# Patient Record
Sex: Female | Born: 1945
Health system: Southern US, Community
[De-identification: ages and names within clinical notes are randomized; demographics above are authoritative.]

## PROBLEM LIST (undated history)

## (undated) DIAGNOSIS — F32A Depression, unspecified: Secondary | ICD-10-CM

## (undated) DIAGNOSIS — Z952 Presence of prosthetic heart valve: Secondary | ICD-10-CM

## (undated) DIAGNOSIS — C801 Malignant (primary) neoplasm, unspecified: Secondary | ICD-10-CM

## (undated) DIAGNOSIS — F329 Major depressive disorder, single episode, unspecified: Secondary | ICD-10-CM

## (undated) DIAGNOSIS — J90 Pleural effusion, not elsewhere classified: Secondary | ICD-10-CM

## (undated) DIAGNOSIS — I10 Essential (primary) hypertension: Secondary | ICD-10-CM

## (undated) DIAGNOSIS — I4891 Unspecified atrial fibrillation: Secondary | ICD-10-CM

## (undated) DIAGNOSIS — B029 Zoster without complications: Secondary | ICD-10-CM

## (undated) DIAGNOSIS — E7800 Pure hypercholesterolemia, unspecified: Secondary | ICD-10-CM

## (undated) DIAGNOSIS — F0390 Unspecified dementia without behavioral disturbance: Secondary | ICD-10-CM

## (undated) DIAGNOSIS — G5600 Carpal tunnel syndrome, unspecified upper limb: Secondary | ICD-10-CM

## (undated) HISTORY — PX: CARDIAC VALVE REPLACEMENT: SHX585

## (undated) HISTORY — DX: Zoster without complications: B02.9

## (undated) HISTORY — DX: Presence of prosthetic heart valve: Z95.2

## (undated) HISTORY — PX: DILATION AND CURETTAGE OF UTERUS: SHX78

## (undated) HISTORY — PX: TUBAL LIGATION: SHX77

## (undated) HISTORY — DX: Essential (primary) hypertension: I10

## (undated) HISTORY — DX: Depression, unspecified: F32.A

## (undated) HISTORY — DX: Pleural effusion, not elsewhere classified: J90

## (undated) HISTORY — PX: OTHER SURGICAL HISTORY: SHX169

## (undated) HISTORY — DX: Major depressive disorder, single episode, unspecified: F32.9

## (undated) HISTORY — DX: Carpal tunnel syndrome, unspecified upper limb: G56.00

## (undated) HISTORY — DX: Pure hypercholesterolemia, unspecified: E78.00

---

## 2001-11-05 ENCOUNTER — Ambulatory Visit (HOSPITAL_COMMUNITY): Admission: RE | Admit: 2001-11-05 | Discharge: 2001-11-05 | Payer: Self-pay | Admitting: Internal Medicine

## 2001-11-05 ENCOUNTER — Encounter: Payer: Self-pay | Admitting: Internal Medicine

## 2001-11-26 ENCOUNTER — Ambulatory Visit (HOSPITAL_COMMUNITY): Admission: RE | Admit: 2001-11-26 | Discharge: 2001-11-26 | Payer: Self-pay | Admitting: Internal Medicine

## 2001-11-26 ENCOUNTER — Encounter: Payer: Self-pay | Admitting: Internal Medicine

## 2002-11-08 ENCOUNTER — Encounter: Payer: Self-pay | Admitting: Internal Medicine

## 2002-11-08 ENCOUNTER — Ambulatory Visit (HOSPITAL_COMMUNITY): Admission: RE | Admit: 2002-11-08 | Discharge: 2002-11-08 | Payer: Self-pay | Admitting: Internal Medicine

## 2003-11-17 ENCOUNTER — Ambulatory Visit (HOSPITAL_COMMUNITY): Admission: RE | Admit: 2003-11-17 | Discharge: 2003-11-17 | Payer: Self-pay | Admitting: Internal Medicine

## 2003-11-25 ENCOUNTER — Ambulatory Visit (HOSPITAL_COMMUNITY): Admission: RE | Admit: 2003-11-25 | Discharge: 2003-11-25 | Payer: Self-pay | Admitting: Internal Medicine

## 2004-06-05 ENCOUNTER — Ambulatory Visit (HOSPITAL_COMMUNITY): Admission: RE | Admit: 2004-06-05 | Discharge: 2004-06-05 | Payer: Self-pay | Admitting: *Deleted

## 2004-11-27 ENCOUNTER — Ambulatory Visit (HOSPITAL_COMMUNITY): Admission: RE | Admit: 2004-11-27 | Discharge: 2004-11-27 | Payer: Self-pay | Admitting: Internal Medicine

## 2007-12-24 ENCOUNTER — Ambulatory Visit (HOSPITAL_COMMUNITY): Admission: RE | Admit: 2007-12-24 | Discharge: 2007-12-24 | Payer: Self-pay | Admitting: Internal Medicine

## 2009-01-11 ENCOUNTER — Ambulatory Visit (HOSPITAL_COMMUNITY): Admission: RE | Admit: 2009-01-11 | Discharge: 2009-01-11 | Payer: Self-pay | Admitting: Internal Medicine

## 2010-01-12 ENCOUNTER — Ambulatory Visit (HOSPITAL_COMMUNITY): Admission: RE | Admit: 2010-01-12 | Discharge: 2010-01-12 | Payer: Self-pay | Admitting: Internal Medicine

## 2010-12-16 HISTORY — PX: AORTIC VALVE REPLACEMENT: SHX41

## 2011-01-06 ENCOUNTER — Encounter: Payer: Self-pay | Admitting: Internal Medicine

## 2011-05-03 NOTE — Procedures (Signed)
NAME:  Judith Schroeder, Judith Schroeder                         ACCOUNT NO.:  [id number]   MEDICAL RECORD NO.:  [id number]                   PATIENT TYPE:  OUT   LOCATION:  RAD                                  FACILITY:  [facility]   PHYSICIAN:  Vida Roller, M.D.                DATE OF BIRTH:  Jun 27, 1946   DATE OF PROCEDURE:  11/25/2003  DATE OF DISCHARGE:                                  ECHOCARDIOGRAM   TAPE NUMBER:  [id number]   TAPE COUNT:  [id number]   INDICATIONS FOR PROCEDURE:  This is a 65 year old female with an aortic  murmur. No other cardiovascular disease.   TECHNICAL QUALITY:  Good.   M-MODE TRACINGS:  The aorta is 28 mm.   The left atrium is 33 mm.   The septum is 14 mm.   Posterior wall is 11 mm.   Left ventricular diastolic dimension is 41 mm.   Left ventricular systolic dimension is 29 mm.   2-D AND DOPPLER IMAGING:  The left ventricle is normal size with vigorous  systolic function. There is no obvious wall motion abnormalities. Diastolic  function was mildly impaired but there is significant valvular heart  disease.   The right ventricle is normal size with also equally vigorous systolic  function.   Both atria appear to be top normal in size. There is no obvious atrial  septal defect.   The aortic valve is sclerotic and appears to be bicuspid with what appears  to be a trileaflet valve with a fused commissure between the left coronary  and the non-coronary cusp. There is significant calcification. There is  trivial aortic insufficiency. There is, however, a significant stenosis. The  valve itself appears to open normally at one of the leaflets and it is  difficult for me to tell which one. Actually, prolapse is in systole. There  is a significant gradient across the valve with a peak gradient measured by  Doppler at 42 mmHg and a mean gradient of 24 mmHg. Calculated valve area is  about 1.0 cm squared.   The mitral valve has mild annular calcification and trivial  insufficiency.  No stenosis is seen.   The tricuspid valve is morphologically unremarkable with trivial  insufficiency. No stenosis is seen.   The pulmonic valve was not well seen in all views but appears to have  trivial insufficiency.   Pericardial structures are normal.   The inferior vena cava appears to be normal size.   The aortic root appears to be mildly dilated superior to the sinuses of  Valsalva, consistent with a significant aortic valvular obstruction. The  suprasternal notch view reveals what appeared to be normal great vessels  with appropriate flow. There is no evidence of a patent ductus arteriosus or  a coarctation.      ___________________________________________  Vida Roller, M.D.   JH/MEDQ  D:  11/25/2003  T:  11/26/2003  Job:  [id number]

## 2011-05-03 NOTE — Procedures (Signed)
NAME:  Judith Schroeder, Judith Schroeder                         ACCOUNT NO.:  [id number]   MEDICAL RECORD NO.:  [id number]                   PATIENT TYPE:  OUT   LOCATION:  RAD                                  FACILITY:  [facility]   PHYSICIAN:  Vida Roller, M.D.                DATE OF BIRTH:  12/29/1945   DATE OF PROCEDURE:  06/05/2004  DATE OF DISCHARGE:                                  ECHOCARDIOGRAM   PRIMARY CARE PHYSICIAN:  Kingsley Callander. Ouida Sills, M.D.   TAPE NUMBER:  [id number]   TAPE COUNT:  [phone]   CLINICAL INFORMATION:  [age]-year-old woman with aortic stenosis.   PREVIOUS STUDY:  Most recent echocardiogram December 2004.  At that time she  had normal left ventricular systolic function, a bicuspid aortic valve which  was moderately sclerotic, there was evidence of moderate aortic stenosis  with a peak gradient of 42 mmHg and an estimated valve area of around 1 sq  cm, she no significant aortic stenosis at that point, she had  mildly  dilated aortic root and trivial insufficiency of the mitral valve.  Today's  study is technically adequate.   M-MODE:  AORTA:  32 mm  (<4.0)  LEFT ATRIUM:  38 mm  (<4.0)  SEPTUM:  14 mm  (0.7-1.1)  POSTERIOR WALL:  14 mm  (0.7-1.1)  LV-DIASTOLE:  40 mm  (<5.7)  LV-SYSTOLE:  29 mm  (<4.0)  E-SEPTAL:  (<0.5)  RV-DIASTOLE:  (<2.5)  IVC:  (<2.0)   TWO-DIMENSIONAL AND DOPPLER IMAGING:  The left ventricle is normal size with  normal systolic function.  There mild concentric left ventricular  hypertrophy.  No wall motion abnormalities are seen.  Diastolic function was  not assessed due to the severity of the valvular heart disease.   The right ventricle is normal size with normal systolic function.   Both atria appear to be normal size.  There is no obvious atrial septal  defect.   The aortic valve is sclerotic.  It appears to be bicuspid.  There is  evidence of significant sclerotic change.  There is at least moderate  stenosis with a peak gradient of 42 mmHg,  a peak velocity of 3.2 m/sec.  Estimated aortic valve area is still right around 1 sq cm.  No significant  insufficiency is seen.   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  There is mild to moderate  insufficiency.  No stenosis is seen.   The tricuspid valve is morphologically unremarkable with mild insufficiency.  No stenosis is seen.   The pulmonic valve is not well seen.   Pericardial structures appear normal.   The inferior vena cava appears to be normal size.   The ascending aorta is still mildly dilated as previously described.   ASSESSMENT:  There is no significant interval change in the severity of the  valvular heart disease.      ___________________________________________  Vida Roller, M.D.   JH/MEDQ  D:  06/05/2004  T:  06/05/2004  Job:  [id number]

## 2011-12-13 ENCOUNTER — Other Ambulatory Visit (HOSPITAL_COMMUNITY): Payer: Self-pay | Admitting: Internal Medicine

## 2011-12-13 DIAGNOSIS — Z139 Encounter for screening, unspecified: Secondary | ICD-10-CM

## 2011-12-19 ENCOUNTER — Ambulatory Visit (HOSPITAL_COMMUNITY)
Admission: RE | Admit: 2011-12-19 | Discharge: 2011-12-19 | Disposition: A | Discharge status: Home / Self Care | Payer: Medicare Other | Source: Ambulatory Visit | Attending: Internal Medicine | Admitting: Internal Medicine

## 2011-12-19 DIAGNOSIS — Z1231 Encounter for screening mammogram for malignant neoplasm of breast: Secondary | ICD-10-CM | POA: Diagnosis not present

## 2011-12-19 DIAGNOSIS — Z139 Encounter for screening, unspecified: Secondary | ICD-10-CM

## 2012-01-22 DIAGNOSIS — I10 Essential (primary) hypertension: Secondary | ICD-10-CM | POA: Diagnosis not present

## 2012-04-27 DIAGNOSIS — I10 Essential (primary) hypertension: Secondary | ICD-10-CM | POA: Diagnosis not present

## 2012-07-08 DIAGNOSIS — I10 Essential (primary) hypertension: Secondary | ICD-10-CM | POA: Diagnosis not present

## 2012-07-08 DIAGNOSIS — I359 Nonrheumatic aortic valve disorder, unspecified: Secondary | ICD-10-CM | POA: Diagnosis not present

## 2012-07-08 DIAGNOSIS — R0602 Shortness of breath: Secondary | ICD-10-CM | POA: Diagnosis not present

## 2012-07-08 DIAGNOSIS — E785 Hyperlipidemia, unspecified: Secondary | ICD-10-CM | POA: Diagnosis not present

## 2012-08-28 DIAGNOSIS — I10 Essential (primary) hypertension: Secondary | ICD-10-CM | POA: Diagnosis not present

## 2013-04-19 DIAGNOSIS — I080 Rheumatic disorders of both mitral and aortic valves: Secondary | ICD-10-CM | POA: Diagnosis not present

## 2013-04-19 DIAGNOSIS — E785 Hyperlipidemia, unspecified: Secondary | ICD-10-CM | POA: Diagnosis not present

## 2013-04-19 DIAGNOSIS — I10 Essential (primary) hypertension: Secondary | ICD-10-CM | POA: Diagnosis not present

## 2013-04-19 DIAGNOSIS — Z79899 Other long term (current) drug therapy: Secondary | ICD-10-CM | POA: Diagnosis not present

## 2013-04-27 ENCOUNTER — Other Ambulatory Visit (HOSPITAL_COMMUNITY): Payer: Self-pay | Admitting: Internal Medicine

## 2013-04-27 DIAGNOSIS — E785 Hyperlipidemia, unspecified: Secondary | ICD-10-CM | POA: Diagnosis not present

## 2013-04-27 DIAGNOSIS — Q251 Coarctation of aorta: Secondary | ICD-10-CM | POA: Diagnosis not present

## 2013-04-27 DIAGNOSIS — I10 Essential (primary) hypertension: Secondary | ICD-10-CM | POA: Diagnosis not present

## 2013-04-27 DIAGNOSIS — Z1212 Encounter for screening for malignant neoplasm of rectum: Secondary | ICD-10-CM | POA: Diagnosis not present

## 2013-04-27 DIAGNOSIS — Z124 Encounter for screening for malignant neoplasm of cervix: Secondary | ICD-10-CM | POA: Diagnosis not present

## 2013-04-27 DIAGNOSIS — Z139 Encounter for screening, unspecified: Secondary | ICD-10-CM

## 2013-05-04 ENCOUNTER — Ambulatory Visit (HOSPITAL_COMMUNITY): Payer: Medicare Other

## 2013-05-11 ENCOUNTER — Ambulatory Visit (HOSPITAL_COMMUNITY)
Admission: RE | Admit: 2013-05-11 | Discharge: 2013-05-11 | Disposition: A | Discharge status: Home / Self Care | Payer: Medicare Other | Source: Ambulatory Visit | Attending: Internal Medicine | Admitting: Internal Medicine

## 2013-05-11 DIAGNOSIS — Z1231 Encounter for screening mammogram for malignant neoplasm of breast: Secondary | ICD-10-CM | POA: Insufficient documentation

## 2013-05-11 DIAGNOSIS — Z139 Encounter for screening, unspecified: Secondary | ICD-10-CM

## 2013-07-14 DIAGNOSIS — R7309 Other abnormal glucose: Secondary | ICD-10-CM | POA: Diagnosis not present

## 2013-07-14 DIAGNOSIS — E785 Hyperlipidemia, unspecified: Secondary | ICD-10-CM | POA: Diagnosis not present

## 2013-07-14 DIAGNOSIS — I10 Essential (primary) hypertension: Secondary | ICD-10-CM | POA: Diagnosis not present

## 2013-07-14 DIAGNOSIS — I359 Nonrheumatic aortic valve disorder, unspecified: Secondary | ICD-10-CM | POA: Diagnosis not present

## 2013-10-14 DIAGNOSIS — E785 Hyperlipidemia, unspecified: Secondary | ICD-10-CM | POA: Diagnosis not present

## 2013-10-14 DIAGNOSIS — I359 Nonrheumatic aortic valve disorder, unspecified: Secondary | ICD-10-CM | POA: Diagnosis not present

## 2013-10-14 DIAGNOSIS — I10 Essential (primary) hypertension: Secondary | ICD-10-CM | POA: Diagnosis not present

## 2013-12-29 DIAGNOSIS — I10 Essential (primary) hypertension: Secondary | ICD-10-CM | POA: Diagnosis not present

## 2013-12-29 DIAGNOSIS — I700 Atherosclerosis of aorta: Secondary | ICD-10-CM | POA: Diagnosis not present

## 2014-04-30 DIAGNOSIS — E785 Hyperlipidemia, unspecified: Secondary | ICD-10-CM | POA: Diagnosis not present

## 2014-04-30 DIAGNOSIS — Z79899 Other long term (current) drug therapy: Secondary | ICD-10-CM | POA: Diagnosis not present

## 2014-04-30 DIAGNOSIS — I080 Rheumatic disorders of both mitral and aortic valves: Secondary | ICD-10-CM | POA: Diagnosis not present

## 2014-04-30 DIAGNOSIS — I10 Essential (primary) hypertension: Secondary | ICD-10-CM | POA: Diagnosis not present

## 2014-05-05 DIAGNOSIS — C061 Malignant neoplasm of vestibule of mouth: Secondary | ICD-10-CM | POA: Diagnosis not present

## 2014-05-05 DIAGNOSIS — Z1212 Encounter for screening for malignant neoplasm of rectum: Secondary | ICD-10-CM | POA: Diagnosis not present

## 2014-05-05 DIAGNOSIS — Z0000 Encounter for general adult medical examination without abnormal findings: Secondary | ICD-10-CM | POA: Diagnosis not present

## 2014-10-14 DIAGNOSIS — I10 Essential (primary) hypertension: Secondary | ICD-10-CM | POA: Diagnosis not present

## 2014-10-14 DIAGNOSIS — R413 Other amnesia: Secondary | ICD-10-CM | POA: Diagnosis not present

## 2014-10-14 DIAGNOSIS — E785 Hyperlipidemia, unspecified: Secondary | ICD-10-CM | POA: Diagnosis not present

## 2014-10-14 DIAGNOSIS — R7309 Other abnormal glucose: Secondary | ICD-10-CM | POA: Diagnosis not present

## 2014-10-14 DIAGNOSIS — R0602 Shortness of breath: Secondary | ICD-10-CM | POA: Diagnosis not present

## 2014-10-14 DIAGNOSIS — Z952 Presence of prosthetic heart valve: Secondary | ICD-10-CM | POA: Diagnosis not present

## 2014-10-24 DIAGNOSIS — R0609 Other forms of dyspnea: Secondary | ICD-10-CM | POA: Diagnosis not present

## 2014-10-24 DIAGNOSIS — R079 Chest pain, unspecified: Secondary | ICD-10-CM | POA: Diagnosis not present

## 2014-10-24 DIAGNOSIS — R0602 Shortness of breath: Secondary | ICD-10-CM | POA: Diagnosis not present

## 2014-11-07 DIAGNOSIS — Z23 Encounter for immunization: Secondary | ICD-10-CM | POA: Diagnosis not present

## 2014-11-07 DIAGNOSIS — I10 Essential (primary) hypertension: Secondary | ICD-10-CM | POA: Diagnosis not present

## 2014-11-07 DIAGNOSIS — R413 Other amnesia: Secondary | ICD-10-CM | POA: Diagnosis not present

## 2014-11-07 DIAGNOSIS — I350 Nonrheumatic aortic (valve) stenosis: Secondary | ICD-10-CM | POA: Diagnosis not present

## 2014-11-22 ENCOUNTER — Encounter: Payer: Self-pay | Admitting: Neurology

## 2014-11-22 ENCOUNTER — Encounter: Payer: Self-pay | Admitting: *Deleted

## 2014-11-23 ENCOUNTER — Ambulatory Visit (INDEPENDENT_AMBULATORY_CARE_PROVIDER_SITE_OTHER): Payer: Medicare Other | Admitting: Neurology

## 2014-11-23 ENCOUNTER — Encounter: Payer: Self-pay | Admitting: Neurology

## 2014-11-23 VITALS — BP 123/65 | HR 74 | Ht 62.0 in | Wt 227.6 lb

## 2014-11-23 DIAGNOSIS — R5383 Other fatigue: Secondary | ICD-10-CM | POA: Insufficient documentation

## 2014-11-23 DIAGNOSIS — E538 Deficiency of other specified B group vitamins: Secondary | ICD-10-CM | POA: Diagnosis not present

## 2014-11-23 DIAGNOSIS — R4189 Other symptoms and signs involving cognitive functions and awareness: Secondary | ICD-10-CM

## 2014-11-23 DIAGNOSIS — R5382 Chronic fatigue, unspecified: Secondary | ICD-10-CM | POA: Diagnosis not present

## 2014-11-23 DIAGNOSIS — R6889 Other general symptoms and signs: Secondary | ICD-10-CM | POA: Diagnosis not present

## 2014-11-23 NOTE — Patient Instructions (Signed)
Overall you are doing fairly well but I do want to suggest a few things today:   Remember to drink plenty of fluid, eat healthy meals and do not skip any meals. Try to eat protein with a every meal and eat a healthy snack such as fruit or nuts in between meals. Try to keep a regular sleep-wake schedule and try to exercise daily, particularly in the form of walking, 20-30 minutes a day, if you can.   As far as diagnostic testing: MRI of the brain, Lab tests, Neuropsychiatric testing  I would like to see you back in 4 months, sooner if we need to. Please call us with any interim questions, concerns, problems, updates or refill requests.   Please also call us for any test results so we can go over those with you on the phone.  My clinical assistant and will answer any of your questions and relay your messages to me and also relay most of my messages to you.   Our phone number is [phone]. We also have an after hours call service for urgent matters and there is a physician on-call for urgent questions. For any emergencies you know to call 911 or go to the nearest emergency room

## 2014-11-23 NOTE — Progress Notes (Signed)
[facility]    Provider:  Dr Jaynee Eagles Referring Provider: [name], MD Primary Care Physician:  [name], MD  CC:  Memory problems  HPI:  Judith Schroeder is a 68 y.o. female here as a referral from Dr. Willey Blade for cognitive changes. PMHx of HTN, HLD, Aotic valve disorder s/p replacement. This past summer she noticed that at times ahe would get distracted and her mind would wander. For example, even playing golf she would get distracted and think of a tax return. She is under a lot of stress, mother is 60 and calling her every day. She loses things, was going to bring a folder today and couldn't find it. She is forgetting appointments, she has to keep them documented. Lives alone. Independent. She pays bills on time, she keeps good records. She is paying her mother's bills. She can't spell, this is chronic. She is losing more recent memory, her memory "goes for a split second" and she has to think hard to remember what she was going into a room to do or where she put something. She thinks about something else and her mind switches over. No confusion or lapses of time or episodes of altered consciousness. She has episodes where she has a cold feeling over her body and she shivers. Memory problems "come and go". Not getting worse. Just happening more often. Father with parkinson's disease. Father with dementia.  Fatigued, some days she wants to sleep all days and other days it is fine. She sleeps well. Doesn't know if she snores. She takes her fluid pills at night and gets up at night to urinate but doesn't bother her. She can't take fluid pilld during the day or she wets her pants, she can't urinate a lot like when she is playing golf. She is still social, plays golf. No focal neurologic symptoms.    Reviewed notes, labs and imaging from outside physicians, which showed: HgbA1c 5.6 cbc/cmp unremarkable  Review of Systems: Patient complains of symptoms per HPI as well as the following  symptoms: swelling in legs, easy bruising, confusion, memory loss, feeling cold. Pertinent negatives per HPI. All others negative.   History   Social History  . Marital Status: Widowed    Spouse Name: N/A    Number of Children: 1  . Years of Education: 12+   Occupational History  . Accountant     Social History Main Topics  . Smoking status: Never Smoker   . Smokeless tobacco: Never Used  . Alcohol Use: 0.0 oz/week    0 Not specified per week     Comment: Occ  . Drug Use: No  . Sexual Activity: Not on file   Other Topics Concern  . Not on file   Social History Narrative   Patient lives at home alone.    Patient is widowed.    Patient has 1 child   Patient has 2 years of college   Patient is right handed        Family History  Problem Relation Age of Onset  . Hypertension Mother   . Cancer Father     head and neck     Past Medical History  Diagnosis Date  . Carpal tunnel syndrome   . Kidney stones   . High cholesterol   . High blood pressure   . H/O aortic valve replacement     Past Surgical History  Procedure Laterality Date  . Tubal ligation    . Aortic valve replacement  Current Outpatient Prescriptions  Medication Sig Dispense Refill  . aspirin 81 MG tablet Take 81 mg by mouth daily.    Judith Schroeder aspirin EC 81 MG tablet Take by mouth.    Judith Schroeder eplerenone (INSPRA) 25 MG tablet Take 25 mg by mouth daily.    . furosemide (LASIX) 20 MG tablet Take 20 mg by mouth.    . Influenza Virus Vacc Split PF (AFLURIA PRESERVATIVE FREE) 0.5 ML SUSY Inject into the muscle.    . pneumococcal 13-valent conjugate vaccine (PREVNAR 13) SUSP injection Inject 0.5 mLs into the muscle tomorrow at 10 am.    . pravastatin (PRAVACHOL) 10 MG tablet Take 10 mg by mouth daily.     No current facility-administered medications for this visit.    Allergies as of 11/23/2014  . (No Known Allergies)    Vitals: BP 123/65 mmHg  Pulse 74  Ht 5\' 2"  (1.575 m)  Wt 227 lb 9.6 oz (103.239  kg)  BMI 41.62 kg/m2 Last Weight:  Wt Readings from Last 1 Encounters:  11/23/14 227 lb 9.6 oz (103.239 kg)   Last Height:   Ht Readings from Last 1 Encounters:  11/23/14 5\' 2"  (1.575 m)   Physical exam: Exam: Gen: NAD, conversant, well nourised, obese, well groomed                     CV: RRR, +SEM. No Carotid Bruits. Eyes: Conjunctivae clear without exudates or hemorrhage  Neuro: MoCA 23/30 = -1 cube drawing, -1 naming, -1 fluency, -4 delayed recall Detailed Neurologic Exam  Speech:    Speech is normal; fluent and spontaneous with normal comprehension.  Cognition:    The patient is oriented to person, place, and time;     recent and remote memory intact;     language fluent;     normal attention, concentration,     fund of knowledge Cranial Nerves:    The pupils are equal, round, and reactive to light. The fundi are normal and spontaneous venous pulsations are present. Visual fields are full to finger confrontation. Extraocular movements are intact. Trigeminal sensation is intact and the muscles of mastication are normal. The face is symmetric. The palate elevates in the midline. Voice is normal. Shoulder shrug is normal. The tongue has normal motion without fasciculations.   Coordination:    Normal finger to nose and heel to shin. Normal rapid alternating movements.   Gait:    Heel-toe and tandem gait are normal.   Motor Observation:    No asymmetry, no atrophy, and no involuntary movements noted. Tone:    Normal muscle tone.    Posture:    Posture is normal. normal erect    Strength:    Strength is V/V in the upper and lower limbs.      Sensation: intact     Reflex Exam:  DTR's:    Deep tendon reflexes in the upper and lowers are hyporeflexic throughout   Toes:    The toes are downgoing bilaterally.   Clonus:    Clonus is absent.     Assessment/Plan:   [name] is a 68 y.o. female here as a referral from Dr. Willey Blade for cognitive changes. PMHx of  HTN, HLD, Aortic valve disorder s/p replacement. She is having decreased concentration, remote and recent memory loss, forgets why she walks into a room or where she outs things. Memory problems "come and go". Not getting worse. Just happening more often. Father with parkinson's disease. Father with  dementia.  Fatigued, some days she wants to sleep all day, lots of stress. Neuro exam is non focal. MoCA 23/30.   Will order an MRI of the brain  TSH and B12 cognitive changes Will check TSH for c/o fatigue Neuropsychiatric testing F/u 4 months   Sarina Ill, MD  [facility] [address] [place], [place] [address]  Phone [phone] Fax [phone]

## 2014-11-25 LAB — B12 AND FOLATE PANEL
Folate: 7.4 ng/mL (ref >3.0)
Vitamin B-12: 411 pg/mL (ref 211–946)

## 2014-11-25 LAB — METHYLMALONIC ACID, SERUM: Methylmalonic Acid: 277 nmol/L (ref 0–378)

## 2014-11-25 LAB — TSH: TSH: 1.74 u[IU]/mL (ref 0.450–4.500)

## 2014-12-22 ENCOUNTER — Ambulatory Visit
Admission: RE | Admit: 2014-12-22 | Discharge: 2014-12-22 | Disposition: A | Discharge status: Home / Self Care | Payer: Medicare Other | Source: Ambulatory Visit | Attending: Neurology | Admitting: Neurology

## 2014-12-22 DIAGNOSIS — R413 Other amnesia: Secondary | ICD-10-CM | POA: Diagnosis not present

## 2014-12-22 DIAGNOSIS — R4189 Other symptoms and signs involving cognitive functions and awareness: Secondary | ICD-10-CM

## 2015-03-17 ENCOUNTER — Telehealth: Payer: Self-pay | Admitting: *Deleted

## 2015-03-17 NOTE — Telephone Encounter (Signed)
Spoke with Judith Schroeder and rescheduled her appt. For 4/22 at 10:30 to 04/06/15 at 11:00 due to Dr. Jaynee Eagles being out of the office that day. Told patient to arrive 25min prior to appt. time. Judith Schroeder verbalized understanding.

## 2015-04-05 ENCOUNTER — Encounter: Payer: Self-pay | Admitting: Psychology

## 2015-04-05 ENCOUNTER — Ambulatory Visit: Payer: Medicare Other | Attending: Psychology | Admitting: Psychology

## 2015-04-05 DIAGNOSIS — R4189 Other symptoms and signs involving cognitive functions and awareness: Secondary | ICD-10-CM

## 2015-04-05 DIAGNOSIS — F320 Major depressive disorder, single episode, mild: Secondary | ICD-10-CM | POA: Diagnosis not present
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
## 2015-04-06 ENCOUNTER — Encounter: Payer: Self-pay | Admitting: Neurology

## 2015-04-06 ENCOUNTER — Ambulatory Visit (INDEPENDENT_AMBULATORY_CARE_PROVIDER_SITE_OTHER): Payer: Medicare Other | Admitting: Neurology

## 2015-04-06 VITALS — BP 149/82 | HR 76 | Temp 97.0°F | Ht 62.0 in | Wt 232.5 lb

## 2015-04-06 DIAGNOSIS — F6811 Factitious disorder with predominantly psychological signs and symptoms: Secondary | ICD-10-CM

## 2015-04-06 DIAGNOSIS — R4189 Other symptoms and signs involving cognitive functions and awareness: Secondary | ICD-10-CM | POA: Insufficient documentation

## 2015-04-06 NOTE — Progress Notes (Signed)
[facility]    Provider:  Dr Jaynee Eagles Referring Provider: [name], MD Primary Care Physician:  [name], MD  CC:  Memory loss  HPI:  Judith Schroeder is a 69 y.o. female here as a follow up.  Patient here for follow up of memory loss. She was evaluated by Dr. Valentina Shaggy and this is likely due to stress and depression. Talked about the results, diagnosis, stressed life changes, therapy, psychiatry, treatment for depression. She is making changes in her life. She is caring for her mother, she has a lot of stress. She feels a lot better now that she knows it isn't dementia.   b12 and folate nml tsh nml Unremarkable MRI of the brain  Reviewed notes, labs and imaging from outside physicians, which showed: per preliminary report by Dr. Valentina Shaggy, At this point, it is likely that her attention and memory functioning are being disrupted by ongoing stress and depression although an incipient neurodegenerative process cannot be excluded. In support of the role of mood factors was the report that her cognitive difficulties first manifested coincident with changes in her mood and behavior suggestive of depression; and that she reported subjectively improved cognitive functioning around the time she hired caregivers for her mother thereby reducing her level of life stress. In contrast, the extent of her memory deficit on neurocognitive testing in context of her consistent alertness and attentiveness, relaxed demeanor and apparent steady effort was troubling.  It is recommended that she be treated for depression with antidepressant medication, if medically appropriate. She agreed. Psychological counseling should be deferred for the time being given that she was not overly interested in pursuing counseling at this point. If her cognitive difficulties should persist after improvement in mood, then a repeat neuropsychological evaluation should be considered, perhaps in one year.   11/23/2014  initiial visit: Judith Schroeder is a 69 y.o. female here as a referral from Dr. Willey Blade for cognitive changes. PMHx of HTN, HLD, Aotic valve disorder s/p replacement. This past summer she noticed that at times ahe would get distracted and her mind would wander. For example, even playing golf she would get distracted and think of a tax return. She is under a lot of stress, mother is 80 and calling her every day. She loses things, was going to bring a folder today and couldn't find it. She is forgetting appointments, she has to keep them documented. Lives alone. Independent. She pays bills on time, she keeps good records. She is paying her mother's bills. She can't spell, this is chronic. She is losing more recent memory, her memory "goes for a split second" and she has to think hard to remember what she was going into a room to do or where she put something. She thinks about something else and her mind switches over. No confusion or lapses of time or episodes of altered consciousness. She has episodes where she has a cold feeling over her body and she shivers. Memory problems "come and go". Not getting worse. Just happening more often. Father with parkinson's disease. Father with dementia. Fatigued, some days she wants to sleep all days and other days it is fine. She sleeps well. Doesn't know if she snores. She takes her fluid pills at night and gets up at night to urinate but doesn't bother her. She can't take fluid pilld during the day or she wets her pants, she can't urinate a lot like when she is playing golf. She is still social, plays golf. No focal  neurologic symptoms.    Reviewed notes, labs and imaging from outside physicians, which showed: HgbA1c 5.6 cbc/cmp unremarkable  Review of Systems: Patient complains of symptoms per HPI as well as the following symptoms: swelling in legs, easy bruising, confusion, memory loss, feeling cold. Pertinent negatives per HPI. All others negative.  History    Social History  . Marital Status: Widowed    Spouse Name: N/A  . Number of Children: 1  . Years of Education: 12+   Occupational History  . Accountant     Social History Main Topics  . Smoking status: Never Smoker   . Smokeless tobacco: Never Used  . Alcohol Use: No  . Drug Use: No  . Sexual Activity: Not on file   Other Topics Concern  . Not on file   Social History Narrative   Patient lives at home alone.    Patient is widowed.    Patient has 1 child   Patient has 2 years of college   Patient is right handed    Caffeine use: Drinks 2 cups coffee per day   2-3 glasses tea per day   No soda        Family History  Problem Relation Age of Onset  . Hypertension Mother   . Cancer Father     head and neck     Past Medical History  Diagnosis Date  . Carpal tunnel syndrome   . Kidney stones   . High cholesterol   . High blood pressure   . H/O aortic valve replacement     Past Surgical History  Procedure Laterality Date  . Tubal ligation    . Aortic valve replacement  2012    Current Outpatient Prescriptions  Medication Sig Dispense Refill  . aspirin EC 81 MG tablet Take by mouth.    Marland Kitchen eplerenone (INSPRA) 25 MG tablet Take 25 mg by mouth daily.    . furosemide (LASIX) 20 MG tablet Take 20 mg by mouth.    . metoprolol succinate (TOPROL-XL) 25 MG 24 hr tablet Take 25 mg by mouth daily.    . pravastatin (PRAVACHOL) 10 MG tablet Take 10 mg by mouth daily.    . Influenza Virus Vacc Split PF (AFLURIA PRESERVATIVE FREE) 0.5 ML SUSY Inject into the muscle.    . pneumococcal 13-valent conjugate vaccine (PREVNAR 13) SUSP injection Inject 0.5 mLs into the muscle tomorrow at 10 am.     No current facility-administered medications for this visit.    Allergies as of 04/06/2015  . (No Known Allergies)    Vitals: BP 149/82 mmHg  Pulse 76  Temp(Src) 97 F (36.1 C)  Ht 5\' 2"  (1.575 m)  Wt 232 lb 8 oz (105.461 kg)  BMI 42.51 kg/m2 Last Weight:  Wt Readings from  Last 1 Encounters:  04/06/15 232 lb 8 oz (105.461 kg)   Last Height:   Ht Readings from Last 1 Encounters:  04/06/15 5\' 2"  (1.575 m)        Assessment/Plan: Judith Schroeder is a 69 y.o. female here as a referral from Dr. Willey Blade for cognitive changes. PMHx of HTN, HLD, Aortic valve disorder s/p replacement. She is having decreased concentration, remote and recent memory loss, forgets why she walks into a room or where she outs things. Memory problems "come and go". Not getting worse. Just happening more often. Father with parkinson's disease. Father with dementia. Fatigued, some days she wants to sleep all day, lots of stress. Neuro exam is non  focal. MoCA 23/30.   Extensive neuropsych testing likely pseudodementia due to stress and depression Consider following with pcp for treatment of depression F/u as needed b12 and folate nml tsh nml Unremarkable MRI of the brain   Sarina Ill, MD  [facility] [address] [place], [place] [address]  Phone [phone] Fax [phone]  A total of 15 minutes was spent face-to-face with this patient. Over half this time was spent on counseling patient on the pseudodementia diagnosis and different diagnostic and therapeutic options available.

## 2015-04-06 NOTE — Patient Instructions (Signed)
Overall you are doing fairly well but I do want to suggest a few things today:   Remember to drink plenty of fluid, eat healthy meals and do not skip any meals. Try to eat protein with a every meal and eat a healthy snack such as fruit or nuts in between meals. Try to keep a regular sleep-wake schedule and try to exercise daily, particularly in the form of walking, 20-30 minutes a day, if you can.   As far as your medications are concerned, I would like to suggest: follow up with primary care to consider depression medications  I would like to see you back as needed, sooner if we need to. Please call us with any interim questions, concerns, problems, updates or refill requests.   Please also call us for any test results so we can go over those with you on the phone.  My clinical assistant and will answer any of your questions and relay your messages to me and also relay most of my messages to you.   Our phone number is [phone]. We also have an after hours call service for urgent matters and there is a physician on-call for urgent questions. For any emergencies you know to call 911 or go to the nearest emergency room

## 2015-04-07 ENCOUNTER — Ambulatory Visit: Payer: Medicare Other | Admitting: Neurology

## 2015-04-17 DIAGNOSIS — Z952 Presence of prosthetic heart valve: Secondary | ICD-10-CM | POA: Diagnosis not present

## 2015-04-17 DIAGNOSIS — I10 Essential (primary) hypertension: Secondary | ICD-10-CM | POA: Diagnosis not present

## 2015-04-17 DIAGNOSIS — E782 Mixed hyperlipidemia: Secondary | ICD-10-CM | POA: Diagnosis not present

## 2015-05-01 DIAGNOSIS — Z79899 Other long term (current) drug therapy: Secondary | ICD-10-CM | POA: Diagnosis not present

## 2015-05-01 DIAGNOSIS — I350 Nonrheumatic aortic (valve) stenosis: Secondary | ICD-10-CM | POA: Diagnosis not present

## 2015-05-01 DIAGNOSIS — I10 Essential (primary) hypertension: Secondary | ICD-10-CM | POA: Diagnosis not present

## 2015-05-01 DIAGNOSIS — E785 Hyperlipidemia, unspecified: Secondary | ICD-10-CM | POA: Diagnosis not present

## 2015-05-09 DIAGNOSIS — Z6841 Body Mass Index (BMI) 40.0 and over, adult: Secondary | ICD-10-CM | POA: Diagnosis not present

## 2015-05-09 DIAGNOSIS — I10 Essential (primary) hypertension: Secondary | ICD-10-CM | POA: Diagnosis not present

## 2015-05-09 DIAGNOSIS — E785 Hyperlipidemia, unspecified: Secondary | ICD-10-CM | POA: Diagnosis not present

## 2015-05-09 DIAGNOSIS — D692 Other nonthrombocytopenic purpura: Secondary | ICD-10-CM | POA: Diagnosis not present

## 2015-05-16 ENCOUNTER — Other Ambulatory Visit (HOSPITAL_COMMUNITY): Payer: Self-pay | Admitting: Internal Medicine

## 2015-05-16 DIAGNOSIS — Z1231 Encounter for screening mammogram for malignant neoplasm of breast: Secondary | ICD-10-CM

## 2015-05-31 ENCOUNTER — Ambulatory Visit (HOSPITAL_COMMUNITY)
Admission: RE | Admit: 2015-05-31 | Discharge: 2015-05-31 | Disposition: A | Discharge status: Home / Self Care | Payer: Medicare Other | Source: Ambulatory Visit | Attending: Internal Medicine | Admitting: Internal Medicine

## 2015-05-31 DIAGNOSIS — Z1231 Encounter for screening mammogram for malignant neoplasm of breast: Secondary | ICD-10-CM | POA: Insufficient documentation

## 2015-09-11 DIAGNOSIS — R0602 Shortness of breath: Secondary | ICD-10-CM | POA: Diagnosis not present

## 2015-09-11 DIAGNOSIS — Z952 Presence of prosthetic heart valve: Secondary | ICD-10-CM | POA: Diagnosis not present

## 2015-09-11 DIAGNOSIS — E782 Mixed hyperlipidemia: Secondary | ICD-10-CM | POA: Diagnosis not present

## 2015-09-11 DIAGNOSIS — I10 Essential (primary) hypertension: Secondary | ICD-10-CM | POA: Diagnosis not present

## 2015-12-17 HISTORY — PX: EMPYEMA DRAINAGE: SHX5097

## 2016-07-05 DIAGNOSIS — R413 Other amnesia: Secondary | ICD-10-CM | POA: Diagnosis not present

## 2016-07-05 DIAGNOSIS — I350 Nonrheumatic aortic (valve) stenosis: Secondary | ICD-10-CM | POA: Diagnosis not present

## 2016-07-05 DIAGNOSIS — R609 Edema, unspecified: Secondary | ICD-10-CM | POA: Diagnosis not present

## 2016-07-05 DIAGNOSIS — Z6841 Body Mass Index (BMI) 40.0 and over, adult: Secondary | ICD-10-CM | POA: Diagnosis not present

## 2016-07-19 DIAGNOSIS — R609 Edema, unspecified: Secondary | ICD-10-CM | POA: Diagnosis not present

## 2016-07-19 DIAGNOSIS — R413 Other amnesia: Secondary | ICD-10-CM | POA: Diagnosis not present

## 2016-07-25 ENCOUNTER — Ambulatory Visit: Payer: Self-pay | Admitting: Neurology

## 2016-07-30 ENCOUNTER — Ambulatory Visit (INDEPENDENT_AMBULATORY_CARE_PROVIDER_SITE_OTHER): Payer: Medicare Other | Admitting: Neurology

## 2016-07-30 ENCOUNTER — Encounter: Payer: Self-pay | Admitting: Neurology

## 2016-07-30 VITALS — BP 157/76 | HR 82

## 2016-07-30 DIAGNOSIS — F0390 Unspecified dementia without behavioral disturbance: Secondary | ICD-10-CM | POA: Diagnosis not present

## 2016-07-30 DIAGNOSIS — G3184 Mild cognitive impairment, so stated: Secondary | ICD-10-CM | POA: Diagnosis not present

## 2016-07-30 DIAGNOSIS — F329 Major depressive disorder, single episode, unspecified: Secondary | ICD-10-CM | POA: Diagnosis not present

## 2016-07-30 DIAGNOSIS — F32A Depression, unspecified: Secondary | ICD-10-CM

## 2016-07-30 MED ORDER — DONEPEZIL HCL 10 MG PO TABS: 10.0000 mg | ORAL_TABLET | Freq: Every day | ORAL | 12 refills | Status: DC

## 2016-07-30 NOTE — Patient Instructions (Signed)
Remember to drink plenty of fluid, eat healthy meals and do not skip any meals. Try to eat protein with a every meal and eat a healthy snack such as fruit or nuts in between meals. Try to keep a regular sleep-wake schedule and try to exercise daily, particularly in the form of walking, 20-30 minutes a day, if you can.   As far as your medications are concerned, I would like to suggest: Aricept (Donepezil) 10mg . Start with 1/2 for a week to make sure no side effects. At next appointment would consider a medication called Memantine/Namenda  I would like to see you back in 6 months, sooner if we need to. Please call us with any interim questions, concerns, problems, updates or refill requests.   Our phone number is [phone]. We also have an after hours call service for urgent matters and there is a physician on-call for urgent questions. For any emergencies you know to call 911 or go to the nearest emergency room  Donepezil tablets What is this medicine? DONEPEZIL (doe NEP e zil) is used to treat mild to moderate dementia caused by Alzheimer's disease. This medicine may be used for other purposes; ask your health care provider or pharmacist if you have questions. What should I tell my health care provider before I take this medicine? They need to know if you have any of these conditions: -asthma or other lung disease -difficulty passing urine -head injury -heart disease -history of irregular heartbeat -liver disease -seizures (convulsions) -stomach or intestinal disease, ulcers or stomach bleeding -an unusual or allergic reaction to donepezil, other medicines, foods, dyes, or preservatives -pregnant or trying to get pregnant -breast-feeding How should I use this medicine? Take this medicine by mouth with a glass of water. Follow the directions on the prescription label. You may take this medicine with or without food. Take this medicine at regular intervals. This medicine is usually  taken before bedtime. Do not take it more often than directed. Continue to take your medicine even if you feel better. Do not stop taking except on your doctor's advice. If you are taking the 23 mg donepezil tablet, swallow it whole; do not cut, crush, or chew it. Talk to your pediatrician regarding the use of this medicine in children. Special care may be needed. Overdosage: If you think you have taken too much of this medicine contact a poison control center or emergency room at once. NOTE: This medicine is only for you. Do not share this medicine with others. What if I miss a dose? If you miss a dose, take it as soon as you can. If it is almost time for your next dose, take only that dose, do not take double or extra doses. What may interact with this medicine? Do not take this medicine with any of the following medications: -certain medicines for fungal infections like itraconazole, fluconazole, posaconazole, and voriconazole -cisapride -dextromethorphan; quinidine -dofetilide -dronedarone -pimozide -quinidine -thioridazine -ziprasidone This medicine may also interact with the following medications: -antihistamines for allergy, cough and cold -atropine -bethanechol -carbamazepine -certain medicines for bladder problems like oxybutynin, tolterodine -certain medicines for Parkinson's disease like benztropine, trihexyphenidyl -certain medicines for stomach problems like dicyclomine, hyoscyamine -certain medicines for travel sickness like scopolamine -dexamethasone -ipratropium -NSAIDs, medicines for pain and inflammation, like ibuprofen or naproxen -other medicines for Alzheimer's disease -other medicines that prolong the QT interval (cause an abnormal heart rhythm) -phenobarbital -phenytoin -rifampin, rifabutin or rifapentine This list may not describe all possible interactions. Give your health  care provider a list of all the medicines, herbs, non-prescription drugs, or dietary  supplements you use. Also tell them if you smoke, drink alcohol, or use illegal drugs. Some items may interact with your medicine. What should I watch for while using this medicine? Visit your doctor or health care professional for regular checks on your progress. Check with your doctor or health care professional if your symptoms do not get better or if they get worse. You may get drowsy or dizzy. Do not drive, use machinery, or do anything that needs mental alertness until you know how this drug affects you. What side effects may I notice from receiving this medicine? Side effects that you should report to your doctor or health care professional as soon as possible: -allergic reactions like skin rash, itching or hives, swelling of the face, lips, or tongue -changes in vision -feeling faint or lightheaded, falls -problems with balance -redness, blistering, peeling or loosening of the skin, including inside the mouth -slow heartbeat, or palpitations -stomach pain -unusual bleeding or bruising, red or purple spots on the skin -vomiting -weight loss Side effects that usually do not require medical attention (report to your doctor or health care professional if they continue or are bothersome): -diarrhea, especially when starting treatment -headache -indigestion or heartburn -loss of appetite -muscle cramps -nausea This list may not describe all possible side effects. Call your doctor for medical advice about side effects. You may report side effects to FDA at 1-800-FDA-1088. Where should I keep my medicine? Keep out of reach of children. Store at room temperature between 15 and 30 degrees C (59 and 86 degrees F). Throw away any unused medicine after the expiration date. NOTE: This sheet is a summary. It may not cover all possible information. If you have questions about this medicine, talk to your doctor, pharmacist, or health care provider.    2016, Elsevier/Gold Standard. (2014-07-14  07:51:52)

## 2016-07-30 NOTE — Progress Notes (Signed)
[id number]    Provider:  Dr Jaynee Eagles Referring Provider: [name], MD Primary Care Physician:  [name], MD  CC:  Memory loss  Interval history 07/30/2016: Patient here for followup of memory loss. Had neurocognitive testing with [facility] in 2016. She did perform poorly but diagnostic possibilities included cognitive dysfunction associated with depression, he noted that her cognitive difficulties first manifested coincidently with changes in her mood and behavior suggestive of depression, that her cognitive difficulties had been occurring on an intermittent basis and that she reported subjectively improved cognitive functioning around the time she hired caregivers for her mother by reducing her stress level. However given the findings it was concerning for cerebral dysfunction. Patient was advised to follow up for depression and then come back to clinic for further evaluation   Patient returns today. She feels her depression is improved, she is playing golf, being social. Still her memory continues to decline. She has a significant FHx of dementia. Sister has dementia. Father has dementia. Daughter is here who provides information, says patient tells the same story multiple times. Confused. She can "zone out". She will say things that are random. Especially the last 6 months significant decline. Daughter says her depression is not worse, better despite the memory changes.    HPI:  Judith Schroeder is a 70 y.o. female here as a follow up.  Patient here for follow up of memory loss. She was evaluated by Dr. Valentina Shaggy and this is likely due to stress and depression. Talked about the results, diagnosis, stressed life changes, therapy, psychiatry, treatment for depression. She is making changes in her life. She is caring for her mother, she has a lot of stress.   b12 and folate nml tsh nml Unremarkable MRI of the brain  Reviewed notes, labs and imaging from outside physicians,  which showed: per preliminary report by Dr. Valentina Shaggy, At this point, it is likely that her attention and memory functioning are being disrupted by ongoing stress and depression although an incipient neurodegenerative process cannot be excluded. In support of the role of mood factors was the report that her cognitive difficulties first manifested coincident with changes in her mood and behavior suggestive of depression; and that she reported subjectively improved cognitive functioning around the time she hired caregivers for her mother thereby reducing her level of life stress. In contrast, the extent of her memory deficit on neurocognitive testing in context of her consistent alertness and attentiveness, relaxed demeanor and apparent steady effort was troubling.  It is recommended that she be treated for depression with antidepressant medication, if medically appropriate. She agreed. Psychological counseling should be deferred for the time being given that she was not overly interested in pursuing counseling at this point. If her cognitive difficulties should persist after improvement in mood, then a repeat neuropsychological evaluation should be considered, perhaps in one year.   11/23/2014 initiial visit: Judith Schroeder is a 70 y.o. female here as a referral from Dr. Willey Blade for cognitive changes. PMHx of HTN, HLD, Aotic valve disorder s/p replacement. This past summer she noticed that at times ahe would get distracted and her mind would wander. For example, even playing golf she would get distracted and think of a tax return. She is under a lot of stress, mother is 38 and calling her every day. She loses things, was going to bring a folder today and couldn't find it. She is forgetting appointments, she has to keep them documented. Lives alone. Independent. She pays bills  on time, she keeps good records. She is paying her mother's bills. She can't spell, this is chronic. She is losing more recent memory, her  memory "goes for a split second" and she has to think hard to remember what she was going into a room to do or where she put something. She thinks about something else and her mind switches over. No confusion or lapses of time or episodes of altered consciousness. She has episodes where she has a cold feeling over her body and she shivers. Memory problems "come and go". Not getting worse. Just happening more often. Father with parkinson's disease. Father with dementia. Fatigued, some days she wants to sleep all days and other days it is fine. She sleeps well. Doesn't know if she snores. She takes her fluid pills at night and gets up at night to urinate but doesn't bother her. She can't take fluid pilld during the day or she wets her pants, she can't urinate a lot like when she is playing golf. She is still social, plays golf. No focal neurologic symptoms.    Reviewed notes, labs and imaging from outside physicians, which showed: HgbA1c 5.6 cbc/cmp unremarkable  Review of Systems: Patient complains of symptoms per HPI as well as the following symptoms: swelling in legs, easy bruising, confusion, memory loss, feeling cold. Pertinent negatives per HPI. All others negative.   Social History   Social History  . Marital status: Widowed    Spouse name: N/A  . Number of children: 1  . Years of education: 12+   Occupational History  . Accountant     Social History Main Topics  . Smoking status: Never Smoker  . Smokeless tobacco: Never Used  . Alcohol use No  . Drug use: No  . Sexual activity: Not on file   Other Topics Concern  . Not on file   Social History Narrative   Patient lives at home alone.    Patient is widowed.    Patient has 1 child   Patient has 2 years of college   Patient is right handed    Caffeine use: Drinks 2 cups coffee per day   2-3 glasses tea per day   No soda        Family History  Problem Relation Age of Onset  . Hypertension Mother   . Cancer Father       head and neck     Past Medical History:  Diagnosis Date  . Carpal tunnel syndrome   . H/O aortic valve replacement   . High blood pressure   . High cholesterol   . Kidney stones     Past Surgical History:  Procedure Laterality Date  . AORTIC VALVE REPLACEMENT  2012  . TUBAL LIGATION      Current Outpatient Prescriptions  Medication Sig Dispense Refill  . aspirin EC 81 MG tablet Take by mouth.    Marland Kitchen eplerenone (INSPRA) 25 MG tablet Take 25 mg by mouth daily.    . furosemide (LASIX) 20 MG tablet Take 20 mg by mouth.    . metoprolol succinate (TOPROL-XL) 25 MG 24 hr tablet Take 25 mg by mouth daily.    . rosuvastatin (CRESTOR) 5 MG tablet Take 5 mg by mouth daily.    Marland Kitchen donepezil (ARICEPT) 10 MG tablet Take 1 tablet (10 mg total) by mouth at bedtime. 30 tablet 12   No current facility-administered medications for this visit.     Allergies as of 07/30/2016 - Review Complete  07/30/2016  Allergen Reaction Noted  . Atorvastatin Other (See Comments) 09/11/2015    Vitals: BP (!) 157/76   Pulse 82  Last Weight:  Wt Readings from Last 1 Encounters:  04/06/15 232 lb 8 oz (105.5 kg)   Last Height:   Ht Readings from Last 1 Encounters:  04/06/15 5\' 2"  (1.575 m)   MMSE - Mini Mental State Exam 07/30/2016  Orientation to time 2  Orientation to Place 3  Registration 3  Attention/ Calculation 5  Recall 1  Language- name 2 objects 2  Language- repeat 1  Language- follow 3 step command 3  Language- read & follow direction 1  Write a sentence 1  Copy design 1  Total score 23    Assessment/Plan: Judith Schroeder is a 70 y.o. female here as a referral from Dr. Willey Blade for cognitive changes. PMHx of HTN, HLD, Aortic valve disorder s/p replacement. Here with her daughter. Memory is declining despite resolution of depression and stress which likely points to a neurocognitive degenerative disorder especially given her FHx of dementia and sister who has significant dementia at this  point. Had a discussion with patient and daughter who understood.   b12 and folate nml tsh nml Unremarkable MRI of the brain Start aricept Next appt start namenda Dr Casimiro Needle for eval and treatmet of depression and counseling  Cc: Dr. Cecille Aver, MD  [facility] [address] [place], [place] [address]  Phone [phone] Fax [phone]  A total of 30 minutes was spent face-to-face with this patient. Over half this time was spent on counseling patient on the [facility] possibly dementia diagnosis and different diagnostic and therapeutic options available.

## 2016-07-31 ENCOUNTER — Encounter: Payer: Self-pay | Admitting: Neurology

## 2016-07-31 DIAGNOSIS — G3184 Mild cognitive impairment, so stated: Secondary | ICD-10-CM | POA: Insufficient documentation

## 2016-08-01 ENCOUNTER — Telehealth: Payer: Self-pay | Admitting: *Deleted

## 2016-09-05 ENCOUNTER — Emergency Department (HOSPITAL_COMMUNITY): Payer: Medicare Other

## 2016-09-05 ENCOUNTER — Inpatient Hospital Stay (HOSPITAL_COMMUNITY)
Admission: EM | Admit: 2016-09-05 | Discharge: 2016-09-12 | DRG: 163 | Disposition: A | Discharge status: Home / Self Care | Payer: Medicare Other | Attending: Surgery | Admitting: Surgery

## 2016-09-05 ENCOUNTER — Encounter (HOSPITAL_COMMUNITY): Payer: Self-pay | Admitting: Cardiology

## 2016-09-05 DIAGNOSIS — Z888 Allergy status to other drugs, medicaments and biological substances status: Secondary | ICD-10-CM | POA: Diagnosis not present

## 2016-09-05 DIAGNOSIS — B9689 Other specified bacterial agents as the cause of diseases classified elsewhere: Secondary | ICD-10-CM | POA: Diagnosis present

## 2016-09-05 DIAGNOSIS — E876 Hypokalemia: Secondary | ICD-10-CM | POA: Diagnosis present

## 2016-09-05 DIAGNOSIS — Z6839 Body mass index (BMI) 39.0-39.9, adult: Secondary | ICD-10-CM | POA: Diagnosis not present

## 2016-09-05 DIAGNOSIS — R0789 Other chest pain: Secondary | ICD-10-CM | POA: Diagnosis not present

## 2016-09-05 DIAGNOSIS — J9811 Atelectasis: Secondary | ICD-10-CM | POA: Diagnosis not present

## 2016-09-05 DIAGNOSIS — J90 Pleural effusion, not elsewhere classified: Secondary | ICD-10-CM | POA: Diagnosis not present

## 2016-09-05 DIAGNOSIS — Z8249 Family history of ischemic heart disease and other diseases of the circulatory system: Secondary | ICD-10-CM

## 2016-09-05 DIAGNOSIS — F0281 Dementia in other diseases classified elsewhere with behavioral disturbance: Secondary | ICD-10-CM | POA: Diagnosis present

## 2016-09-05 DIAGNOSIS — G301 Alzheimer's disease with late onset: Secondary | ICD-10-CM

## 2016-09-05 DIAGNOSIS — F0390 Unspecified dementia without behavioral disturbance: Secondary | ICD-10-CM | POA: Diagnosis present

## 2016-09-05 DIAGNOSIS — E7800 Pure hypercholesterolemia, unspecified: Secondary | ICD-10-CM | POA: Diagnosis present

## 2016-09-05 DIAGNOSIS — R079 Chest pain, unspecified: Secondary | ICD-10-CM | POA: Diagnosis not present

## 2016-09-05 DIAGNOSIS — I429 Cardiomyopathy, unspecified: Secondary | ICD-10-CM | POA: Diagnosis not present

## 2016-09-05 DIAGNOSIS — Z953 Presence of xenogenic heart valve: Secondary | ICD-10-CM | POA: Diagnosis not present

## 2016-09-05 DIAGNOSIS — R109 Unspecified abdominal pain: Secondary | ICD-10-CM | POA: Diagnosis not present

## 2016-09-05 DIAGNOSIS — J869 Pyothorax without fistula: Secondary | ICD-10-CM | POA: Diagnosis not present

## 2016-09-05 DIAGNOSIS — J9601 Acute respiratory failure with hypoxia: Secondary | ICD-10-CM | POA: Diagnosis not present

## 2016-09-05 DIAGNOSIS — R938 Abnormal findings on diagnostic imaging of other specified body structures: Secondary | ICD-10-CM | POA: Diagnosis present

## 2016-09-05 DIAGNOSIS — J948 Other specified pleural conditions: Secondary | ICD-10-CM | POA: Diagnosis not present

## 2016-09-05 DIAGNOSIS — J189 Pneumonia, unspecified organism: Secondary | ICD-10-CM | POA: Diagnosis present

## 2016-09-05 DIAGNOSIS — I10 Essential (primary) hypertension: Secondary | ICD-10-CM | POA: Diagnosis present

## 2016-09-05 DIAGNOSIS — Z9689 Presence of other specified functional implants: Secondary | ICD-10-CM

## 2016-09-05 DIAGNOSIS — Z4682 Encounter for fitting and adjustment of non-vascular catheter: Secondary | ICD-10-CM | POA: Diagnosis not present

## 2016-09-05 DIAGNOSIS — B954 Other streptococcus as the cause of diseases classified elsewhere: Secondary | ICD-10-CM | POA: Diagnosis present

## 2016-09-05 DIAGNOSIS — G3184 Mild cognitive impairment, so stated: Secondary | ICD-10-CM | POA: Diagnosis not present

## 2016-09-05 DIAGNOSIS — E669 Obesity, unspecified: Secondary | ICD-10-CM | POA: Diagnosis present

## 2016-09-05 DIAGNOSIS — R846 Abnormal cytological findings in specimens from respiratory organs and thorax: Secondary | ICD-10-CM | POA: Diagnosis not present

## 2016-09-05 DIAGNOSIS — F02818 Dementia in other diseases classified elsewhere, unspecified severity, with other behavioral disturbance: Secondary | ICD-10-CM | POA: Diagnosis present

## 2016-09-05 DIAGNOSIS — R0602 Shortness of breath: Secondary | ICD-10-CM | POA: Diagnosis not present

## 2016-09-05 DIAGNOSIS — E8809 Other disorders of plasma-protein metabolism, not elsewhere classified: Secondary | ICD-10-CM | POA: Diagnosis present

## 2016-09-05 DIAGNOSIS — Z7982 Long term (current) use of aspirin: Secondary | ICD-10-CM

## 2016-09-05 DIAGNOSIS — Z9889 Other specified postprocedural states: Secondary | ICD-10-CM

## 2016-09-05 DIAGNOSIS — R918 Other nonspecific abnormal finding of lung field: Secondary | ICD-10-CM | POA: Diagnosis not present

## 2016-09-05 DIAGNOSIS — J984 Other disorders of lung: Secondary | ICD-10-CM | POA: Diagnosis not present

## 2016-09-05 LAB — CBC
HEMATOCRIT: 33.7 % — AB (ref 36.0–46.0)
Hemoglobin: 11.4 g/dL — ABNORMAL LOW (ref 12.0–15.0)
MCH: 31.4 pg (ref 26.0–34.0)
MCHC: 33.8 g/dL (ref 30.0–36.0)
MCV: 92.8 fL (ref 78.0–100.0)
PLATELETS: 510 10*3/uL — AB (ref 150–400)
RBC: 3.63 MIL/uL — ABNORMAL LOW (ref 3.87–5.11)
RDW: 13 % (ref 11.5–15.5)
WBC: 17.2 10*3/uL — AB (ref 4.0–10.5)

## 2016-09-05 LAB — COMPREHENSIVE METABOLIC PANEL
ALT: 60 U/L — ABNORMAL HIGH (ref 14–54)
ANION GAP: 5 (ref 5–15)
AST: 65 U/L — ABNORMAL HIGH (ref 15–41)
Albumin: 2.3 g/dL — ABNORMAL LOW (ref 3.5–5.0)
Alkaline Phosphatase: 104 U/L (ref 38–126)
BILIRUBIN TOTAL: 0.8 mg/dL (ref 0.3–1.2)
BUN: 16 mg/dL (ref 6–20)
CHLORIDE: 99 mmol/L — AB (ref 101–111)
CO2: 27 mmol/L (ref 22–32)
Calcium: 7.4 mg/dL — ABNORMAL LOW (ref 8.9–10.3)
Creatinine, Ser: 0.88 mg/dL (ref 0.44–1.00)
Glucose, Bld: 133 mg/dL — ABNORMAL HIGH (ref 65–99)
POTASSIUM: 3 mmol/L — AB (ref 3.5–5.1)
Sodium: 131 mmol/L — ABNORMAL LOW (ref 135–145)
TOTAL PROTEIN: 6.7 g/dL (ref 6.5–8.1)

## 2016-09-05 LAB — TROPONIN I

## 2016-09-05 LAB — PROTIME-INR
INR: 1.17
PROTHROMBIN TIME: 15 s (ref 11.4–15.2)

## 2016-09-05 LAB — BRAIN NATRIURETIC PEPTIDE: B NATRIURETIC PEPTIDE 5: 127 pg/mL — AB (ref 0.0–100.0)

## 2016-09-05 MED ORDER — PIPERACILLIN-TAZOBACTAM 3.375 G IVPB 30 MIN
3.3750 g | Freq: Once | INTRAVENOUS | Status: AC
  Administered 2016-09-05: 3.375 g via INTRAVENOUS
  Filled 2016-09-05: qty 50

## 2016-09-05 MED ORDER — METOPROLOL SUCCINATE ER 25 MG PO TB24
25.0000 mg | ORAL_TABLET | Freq: Every day | ORAL | Status: DC
  Administered 2016-09-06 – 2016-09-07 (×2): 25 mg via ORAL
  Filled 2016-09-05 (×3): qty 1

## 2016-09-05 MED ORDER — IOPAMIDOL (ISOVUE-300) INJECTION 61%
INTRAVENOUS | Status: AC
  Filled 2016-09-05: qty 30

## 2016-09-05 MED ORDER — ONDANSETRON HCL 4 MG/2ML IJ SOLN: 4.0000 mg | Freq: Four times a day (QID) | INTRAMUSCULAR | Status: DC | PRN

## 2016-09-05 MED ORDER — SODIUM CHLORIDE 0.9 % IV SOLN
INTRAVENOUS | Status: DC
  Administered 2016-09-05: 10 mL/h via INTRAVENOUS

## 2016-09-05 MED ORDER — DONEPEZIL HCL 10 MG PO TABS
10.0000 mg | ORAL_TABLET | Freq: Every day | ORAL | Status: DC
  Administered 2016-09-05 – 2016-09-11 (×7): 10 mg via ORAL
  Filled 2016-09-05 (×4): qty 1
  Filled 2016-09-05 (×2): qty 2
  Filled 2016-09-05: qty 1

## 2016-09-05 MED ORDER — IOPAMIDOL (ISOVUE-370) INJECTION 76%
100.0000 mL | Freq: Once | INTRAVENOUS | Status: AC | PRN
  Administered 2016-09-05: 100 mL via INTRAVENOUS

## 2016-09-05 MED ORDER — ROSUVASTATIN CALCIUM 10 MG PO TABS
5.0000 mg | ORAL_TABLET | Freq: Every day | ORAL | Status: DC
  Administered 2016-09-06 – 2016-09-11 (×6): 5 mg via ORAL
  Filled 2016-09-05 (×7): qty 1

## 2016-09-05 MED ORDER — POTASSIUM CHLORIDE 10 MEQ/100ML IV SOLN
10.0000 meq | INTRAVENOUS | Status: AC
  Administered 2016-09-05 – 2016-09-06 (×3): 10 meq via INTRAVENOUS
  Filled 2016-09-05 (×3): qty 100

## 2016-09-05 MED ORDER — ONDANSETRON HCL 4 MG PO TABS: 4.0000 mg | ORAL_TABLET | Freq: Four times a day (QID) | ORAL | Status: DC | PRN

## 2016-09-05 MED ORDER — VANCOMYCIN HCL IN DEXTROSE 1-5 GM/200ML-% IV SOLN
1000.0000 mg | Freq: Once | INTRAVENOUS | Status: AC
  Administered 2016-09-05: 1000 mg via INTRAVENOUS
  Filled 2016-09-05: qty 200

## 2016-09-05 NOTE — ED Triage Notes (Signed)
Right sided neck pain that radiates into chest since this morning.

## 2016-09-05 NOTE — H&P (Signed)
TRH H&P   Patient Demographics:    Judith Schroeder, is a 70 y.o. female  MRN: [id number]  DOB - 12/08/1946  Admit Date - 09/05/2016  Outpatient Primary MD for the patient is [name], MD  Referring MD/NP/PA: Dr Dayna Barker  Patient coming from: Home  Chief Complaint  Patient presents with  . Chest Pain      HPI:    Judith Schroeder  is a 70 y.o. female, With history of aortic valve replacement [bovine valve], not on anticoagulation, hyperlipidemia, mild cognitive impairment with memory loss on Aricept was brought to the hospital for worsening shortness of breath for past 1 week. As per patient's daughter the patient has been feeling fatigue, shortness of breath and coughing for possible months intermittently and has been PCP office and cardiologist, where they could not figure out the reason for patient's symptoms. Patient has a history of aortic valve replacement and is taking Lasix for lower extremity edema. For past 1 week patient has been becoming more short of breath on exertion, associated chills and sweating intermittently. Has been coughing mostly dry, no nausea vomiting or diarrhea. No chest pain. She has no history of cancer. No history of COPD, patient is a nonsmoker. But was exposed to passive smoke for 20 years, as her husband was a smoker.  In the ED CT chest showed complex right pleural effusion    Review of systems:    In addition to the HPI above, ** No Headache, No changes with Vision or hearing, No problems swallowing food or Liquids, No Abdominal pain, No Nausea or Vomiting, bowel movements are regular, No Blood in stool or Urine, No dysuria, No new skin rashes or bruises, No new joints pains-aches,  No new weakness, tingling, numbness in any extremity, No recent weight gain or loss, No polyuria, polydypsia or polyphagia, No significant Mental Stressors.  A full 10 point Review of  Systems was done, except as stated above, all other Review of Systems were negative.   With Past History of the following :    Past Medical History:  Diagnosis Date  . Carpal tunnel syndrome   . H/O aortic valve replacement   . High blood pressure   . High cholesterol   . Kidney stones       Past Surgical History:  Procedure Laterality Date  . AORTIC VALVE REPLACEMENT  2012  . TUBAL LIGATION        Social History:     Social History  Substance Use Topics  . Smoking status: Never Smoker  . Smokeless tobacco: Never Used  . Alcohol use No        Family History :     Family History  Problem Relation Age of Onset  . Hypertension Mother   . Cancer Father     head and neck       Home Medications:   Prior to Admission medications   Medication Sig Start Date End Date Taking? Authorizing Provider  aspirin EC 325 MG tablet Take 325  mg by mouth daily.   Yes Historical Provider, MD  donepezil (ARICEPT) 10 MG tablet Take 1 tablet (10 mg total) by mouth at bedtime. 07/30/16  Yes Melvenia Beam, MD  eplerenone (INSPRA) 25 MG tablet Take 25 mg by mouth daily.   Yes Historical Provider, MD  furosemide (LASIX) 20 MG tablet Take 20 mg by mouth daily.    Yes Historical Provider, MD  metoprolol succinate (TOPROL-XL) 25 MG 24 hr tablet Take 25 mg by mouth daily.   Yes Historical Provider, MD  rosuvastatin (CRESTOR) 5 MG tablet Take 5 mg by mouth daily.   Yes Historical Provider, MD     Allergies:     Allergies  Allergen Reactions  . Atorvastatin Other (See Comments)    Muscle pain     Physical Exam:   Vitals  Blood pressure 128/61, pulse 90, temperature 98.7 F (37.1 C), temperature source Oral, resp. rate (!) 29, height 5\' 2"  (1.575 m), weight 90.7 kg (200 lb), SpO2 95 %.  1.  General Caucasian female in no acute distress.  2. Psychiatric: Intact judgement and  insight, awake alert, oriented x 3.  3. Neurologic:No focal neurological deficits, all cranial nerves  intact. Strength 5/5 all 4 extremities, sensation intact all 4 extremities, plantars down going.  4. Eyes show anicteric sclerae, moist conjunctivae with no lid lag. PERRLA.  5. ENMT: Oropharynx clear with moist mucous membranes and good dentition  5. Neck: supple, no cervical lymphadenopathy appriciated, No thyromegaly  6. Respiratory :Normal respiratory effort, decreased breath sounds in right lung, dullness to percussion noted in the right lung.  7. Cardiovascular :RRR, no gallops, rubs or murmurs, bilateral 2+ pitting edema  8. GI: Positive bowel sounds, abdomen soft, no tenderness to palpation, no organomegaly appriciated,no rebound -guarding or rigidity.  9. Skin: No cyanosis, normal texture and turgor, no rash, lesions or ulcers  10.Musculoskeletal: Good muscle tone,  joints appear normal , no effusions, normal range of motion         Data Review:    CBC  Recent Labs Lab 09/05/16 1711  WBC 17.2*  HGB 11.4*  HCT 33.7*  PLT 510*  MCV 92.8  MCH 31.4  MCHC 33.8  RDW 13.0   ------------------------------------------------------------------------------------------------------------------  Chemistries   Recent Labs Lab 09/05/16 1711  NA 131*  K 3.0*  CL 99*  CO2 27  GLUCOSE 133*  BUN 16  CREATININE 0.88  CALCIUM 7.4*  AST 65*  ALT 60*  ALKPHOS 104  BILITOT 0.8   ------------------------------------------------------------------------------------------------------------------  ------------------------------------------------------------------------------------------------------------------ GFR: Estimated Creatinine Clearance: 63.2 mL/min (by C-G formula based on SCr of 0.88 mg/dL). Liver Function Tests:  Recent Labs Lab 09/05/16 1711  AST 65*  ALT 60*  ALKPHOS 104  BILITOT 0.8  PROT 6.7  ALBUMIN 2.3*   No results for input(s): LIPASE, AMYLASE in the last 168 hours. No results for input(s): AMMONIA in the last 168 hours. Coagulation  Profile: No results for input(s): INR, PROTIME in the last 168 hours. Cardiac Enzymes:  Recent Labs Lab 09/05/16 1711  TROPONINI <0.03    --------------------------------------------------------------------------------------------------------------- Urine analysis: No results found for: COLORURINE, APPEARANCEUR, LABSPEC, PHURINE, GLUCOSEU, HGBUR, BILIRUBINUR, KETONESUR, PROTEINUR, UROBILINOGEN, NITRITE, LEUKOCYTESUR    ----------------------------------------------------------------------------------------------------------------   Imaging Results:    Dg Chest 2 View  Result Date: 09/05/2016 CLINICAL DATA:  Increased shortness of breath with right leg swelling. Chills starting at 10 a.m. today. EXAM: CHEST  2 VIEW COMPARISON:  None. FINDINGS: Two views study shows right base collapse/ consolidation with  moderate to large right pleural effusion. Soft tissue fullness is noted in the right mediastinum. Left lung appears clear. Cardiopericardial silhouette is at upper limits of normal for size. Patient is status post median sternotomy bones are diffusely demineralized. IMPRESSION: Moderate to large right pleural effusion with right mediastinal fullness in right base collapse/ consolidation. Unilateral disease raises concern for underlying neoplasm. Electronically Signed   By: Misty Stanley M.D.   On: 09/05/2016 17:48   Ct Angio Chest Pe W And/or Wo Contrast  Result Date: 09/05/2016 CLINICAL DATA:  Chest pain, right neck pain and abdominal pain since this morning. EXAM: CT ANGIOGRAPHY CHEST CT ABDOMEN AND PELVIS WITH CONTRAST TECHNIQUE: Multidetector CT imaging of the chest was performed using the standard protocol during bolus administration of intravenous contrast. Multiplanar CT image reconstructions and MIPs were obtained to evaluate the vascular anatomy. Multidetector CT imaging of the abdomen and pelvis was performed using the standard protocol during bolus administration of intravenous  contrast. CONTRAST:  100 cc Isovue 370 COMPARISON:  None. FINDINGS: CTA CHEST FINDINGS Chest wall: No breast masses, supraclavicular or axillary lymphadenopathy. The thyroid gland appears normal. Cardiovascular: The heart is normal in size for age. No pericardial effusion. There is tortuosity, ectasia and calcification of the thoracic aorta. No focal aneurysm or dissection. The branch vessels are patent. The pulmonary arterial tree is fairly well opacified. No filling defects to suggest pulmonary embolism. Mediastinum/Nodes: Borderline mediastinal lymph nodes. Largest right paratracheal node measures 12 mm on image number 30. Precarinal lymph node on image number 30 measures 7 mm. Aorticopulmonary window lymph node on image number 31 measures 8 mm. Subcarinal lymph node on image number 42 measures 13 mm. No hilar adenopathy. The esophagus is grossly normal. Lungs/Pleura: Large right-sided pleural effusion with probable loculation. No obvious enhancing split pleura sign may suggest empyema. There is significant compressive atelectasis of the right middle lobe and right lower lobe. No enhancing pleural nodules. Thoracentesis may be helpful for diagnostic and therapeutic purposes. No left-sided pleural effusion. The left lung is clear. No worrisome pulmonary nodules. Azygos fissure is noted. Musculoskeletal: No significant bony findings. Review of the MIP images confirms the above findings. CT ABDOMEN and PELVIS FINDINGS Hepatobiliary: No focal hepatic lesions or intrahepatic biliary dilatation. Gallstones are noted in the gallbladder but no findings for acute cholecystitis. Normal caliber common bile duct. Pancreas: No mass, inflammation or ductal dilatation. Spleen: Normal size.  No focal lesions. Adrenals/Urinary Tract: The adrenal glands and kidneys are unremarkable. The bladder is normal. Stomach/Bowel: The stomach, duodenum, small bowel and colon are unremarkable. No inflammatory changes, mass lesions or  obstructive findings. The terminal ileum is normal. The appendix is normal. Vascular/Lymphatic: No mesenteric or retroperitoneal mass or adenopathy. Moderate atherosclerotic calcifications involving the aorta. The branch vessels patent. The major venous structures are patent. Reproductive: The endometrium is prominent and somewhat irregular for the patient's age. Could not exclude endometrial hyperplasia or neoplasm. Recommend endometrial sampling. The ovaries are normal. Other: No pelvic adenopathy. No free pelvic fluid collections. No inguinal mass or adenopathy. No abdominal wall hernia or subcutaneous lesions. Musculoskeletal:   No significant bony findings. Review of the MIP images confirms the above findings. IMPRESSION: 1. No CT findings for aortic dissection or aneurysm. 2. No CT findings for pulmonary embolism. 3. Large complex right pleural effusion, likely loculated. No obvious enhancing pleural nodules or masses. There is significant compressive atelectasis of the right middle lobe and right lower lobe. Thoracentesis may be helpful for diagnostic and therapeutic purposes. 4.  No worrisome lung lesions or endobronchial mass. 5. No acute findings in the abdomen/pelvis. 6. Thickened and somewhat irregular endometrium. Recommend GYN consultation and endometrial sampling. Electronically Signed   By: Marijo Sanes M.D.   On: 09/05/2016 20:30   Ct Abdomen Pelvis W Contrast  Result Date: 09/05/2016 CLINICAL DATA:  Chest pain, right neck pain and abdominal pain since this morning. EXAM: CT ANGIOGRAPHY CHEST CT ABDOMEN AND PELVIS WITH CONTRAST TECHNIQUE: Multidetector CT imaging of the chest was performed using the standard protocol during bolus administration of intravenous contrast. Multiplanar CT image reconstructions and MIPs were obtained to evaluate the vascular anatomy. Multidetector CT imaging of the abdomen and pelvis was performed using the standard protocol during bolus administration of intravenous  contrast. CONTRAST:  100 cc Isovue 370 COMPARISON:  None. FINDINGS: CTA CHEST FINDINGS Chest wall: No breast masses, supraclavicular or axillary lymphadenopathy. The thyroid gland appears normal. Cardiovascular: The heart is normal in size for age. No pericardial effusion. There is tortuosity, ectasia and calcification of the thoracic aorta. No focal aneurysm or dissection. The branch vessels are patent. The pulmonary arterial tree is fairly well opacified. No filling defects to suggest pulmonary embolism. Mediastinum/Nodes: Borderline mediastinal lymph nodes. Largest right paratracheal node measures 12 mm on image number 30. Precarinal lymph node on image number 30 measures 7 mm. Aorticopulmonary window lymph node on image number 31 measures 8 mm. Subcarinal lymph node on image number 42 measures 13 mm. No hilar adenopathy. The esophagus is grossly normal. Lungs/Pleura: Large right-sided pleural effusion with probable loculation. No obvious enhancing split pleura sign may suggest empyema. There is significant compressive atelectasis of the right middle lobe and right lower lobe. No enhancing pleural nodules. Thoracentesis may be helpful for diagnostic and therapeutic purposes. No left-sided pleural effusion. The left lung is clear. No worrisome pulmonary nodules. Azygos fissure is noted. Musculoskeletal: No significant bony findings. Review of the MIP images confirms the above findings. CT ABDOMEN and PELVIS FINDINGS Hepatobiliary: No focal hepatic lesions or intrahepatic biliary dilatation. Gallstones are noted in the gallbladder but no findings for acute cholecystitis. Normal caliber common bile duct. Pancreas: No mass, inflammation or ductal dilatation. Spleen: Normal size.  No focal lesions. Adrenals/Urinary Tract: The adrenal glands and kidneys are unremarkable. The bladder is normal. Stomach/Bowel: The stomach, duodenum, small bowel and colon are unremarkable. No inflammatory changes, mass lesions or  obstructive findings. The terminal ileum is normal. The appendix is normal. Vascular/Lymphatic: No mesenteric or retroperitoneal mass or adenopathy. Moderate atherosclerotic calcifications involving the aorta. The branch vessels patent. The major venous structures are patent. Reproductive: The endometrium is prominent and somewhat irregular for the patient's age. Could not exclude endometrial hyperplasia or neoplasm. Recommend endometrial sampling. The ovaries are normal. Other: No pelvic adenopathy. No free pelvic fluid collections. No inguinal mass or adenopathy. No abdominal wall hernia or subcutaneous lesions. Musculoskeletal:   No significant bony findings. Review of the MIP images confirms the above findings. IMPRESSION: 1. No CT findings for aortic dissection or aneurysm. 2. No CT findings for pulmonary embolism. 3. Large complex right pleural effusion, likely loculated. No obvious enhancing pleural nodules or masses. There is significant compressive atelectasis of the right middle lobe and right lower lobe. Thoracentesis may be helpful for diagnostic and therapeutic purposes. 4. No worrisome lung lesions or endobronchial mass. 5. No acute findings in the abdomen/pelvis. 6. Thickened and somewhat irregular endometrium. Recommend GYN consultation and endometrial sampling. Electronically Signed   By: Marijo Sanes M.D.   On: 09/05/2016  20:30    My personal review of EKG: Rhythm NSR, flattened T waves noted in the anterior chest leads   Assessment & Plan:    Active Problems:   Pleural effusion   Pleural effusion on right   1. Right pleural effusion- unclear cause, pneumonia versus malignancy, start empiric antibiotics vancomycin and Zosyn for possible pneumonia. Will schedule ultrasound-guided thoracentesis in a.m. and order labs including cell count, Gram stain and culture, protein, glucose, cytology. We will follow the results. 2. Bilateral lower extremity edema- no echo results in Epic. Check  BNP. If elevated consider IV Lasix. 3. Abnormal EKG- we'll cycle cardiac enzymes, and an echo in a.m. 4. Hypokalemia- replace potassium and check BMP in a.m. 5. Thickened endometrium noted on the CT abdomen- she will  need to follow-up appointment with GYN as outpatient   DVT Prophylaxis-   SCDs ordered, consider Lovenox after thoracentesis  AM Labs Ordered, also please review Full Orders  Family Communication: Admission, patients condition and plan of care including tests being ordered have been discussed with the patient and her daughter and boyfriend at bedside* who indicate understanding and agree with the plan and Code Status.  Code Status:  Full code  Admission status: Inpatient    Time spent in minutes : 60 minutes   Yadiel Aubry S M.D on 09/05/2016 at 10:52 PM  Between 7am to 7pm - Pager - [phone]. After 7pm go to [email or web address] - password [email or web address]  [facility] - Office  [phone]

## 2016-09-05 NOTE — ED Provider Notes (Signed)
[facility] DEPT Provider Note   CSN: [id number] Arrival date & time: 09/05/16  1609     History   Chief Complaint Chief Complaint  Patient presents with  . Chest Pain    HPI Judith Schroeder is a 70 y.o. female.  Patient with 3 or 4 days of progressively worsening shortness of breath. It acutely worsened today and worse with exertion and also has so stated chest pain. No recent fevers or cough. She does have bilateral lower extremity swelling as yet worse over the last couple days as well. Has overall weakness. Had a recent trip to go golfing negative although a long car ride. No history of blood clots. No nausea or vomiting. She has had some intermittent diaphoresis that is abnormal for her. No other associated symptoms or modifying factors. No history of same. No history of heart failure.      Past Medical History:  Diagnosis Date  . Carpal tunnel syndrome   . H/O aortic valve replacement   . High blood pressure   . High cholesterol   . Kidney stones     Patient Active Problem List   Diagnosis Date Noted  . Pleural effusion 09/05/2016  . MCI (mild cognitive impairment) with memory loss 07/31/2016  . Cognitive changes 11/23/2014  . Fatigue 11/23/2014  . Cold feeling 11/23/2014    Past Surgical History:  Procedure Laterality Date  . AORTIC VALVE REPLACEMENT  2012  . TUBAL LIGATION      OB History    No data available       Home Medications    Prior to Admission medications   Medication Sig Start Date End Date Taking? Authorizing Provider  aspirin EC 325 MG tablet Take 325 mg by mouth daily.   Yes Historical Provider, MD  donepezil (ARICEPT) 10 MG tablet Take 1 tablet (10 mg total) by mouth at bedtime. 07/30/16  Yes Melvenia Beam, MD  eplerenone (INSPRA) 25 MG tablet Take 25 mg by mouth daily.   Yes Historical Provider, MD  furosemide (LASIX) 20 MG tablet Take 20 mg by mouth daily.    Yes Historical Provider, MD  metoprolol succinate (TOPROL-XL) 25 MG  24 hr tablet Take 25 mg by mouth daily.   Yes Historical Provider, MD  rosuvastatin (CRESTOR) 5 MG tablet Take 5 mg by mouth daily.   Yes Historical Provider, MD    Family History Family History  Problem Relation Age of Onset  . Hypertension Mother   . Cancer Father     head and neck     Social History Social History  Substance Use Topics  . Smoking status: Never Smoker  . Smokeless tobacco: Never Used  . Alcohol use No     Allergies   Atorvastatin   Review of Systems Review of Systems  All other systems reviewed and are negative.    Physical Exam Updated Vital Signs BP 132/65   Pulse 88   Temp 98.7 F (37.1 C) (Oral)   Resp (!) 28   Ht 5\' 2"  (1.575 m)   Wt 200 lb (90.7 kg)   SpO2 95%   BMI 36.58 kg/m   Physical Exam  Constitutional: She appears well-developed and well-nourished. No distress.  HENT:  Head: Normocephalic and atraumatic.  Eyes: Conjunctivae are normal.  Neck: Neck supple.  Cardiovascular: Regular rhythm.  Tachycardia present.   No murmur heard. Pulmonary/Chest: Effort normal. Tachypnea noted. No respiratory distress. She has decreased breath sounds (bilateral lower lung fields). She has wheezes.  She has rales.  Abdominal: Soft. There is no tenderness.  Musculoskeletal: She exhibits no edema.  Neurological: She is alert.  Skin: Skin is warm and dry.  Psychiatric: She has a normal mood and affect.  Nursing note and vitals reviewed.    ED Treatments / Results  Labs (all labs ordered are listed, but only abnormal results are displayed) Labs Reviewed  CBC - Abnormal; Notable for the following:       Result Value   WBC 17.2 (*)    RBC 3.63 (*)    Hemoglobin 11.4 (*)    HCT 33.7 (*)    Platelets 510 (*)    All other components within normal limits  COMPREHENSIVE METABOLIC PANEL - Abnormal; Notable for the following:    Sodium 131 (*)    Potassium 3.0 (*)    Chloride 99 (*)    Glucose, Bld 133 (*)    Calcium 7.4 (*)    Albumin 2.3  (*)    AST 65 (*)    ALT 60 (*)    All other components within normal limits  TROPONIN I    EKG  EKG Interpretation  Date/Time:  Thursday September 05 2016 16:14:46 EDT Ventricular Rate:  103 PR Interval:  150 QRS Duration: 78 QT Interval:  348 QTC Calculation: 455 R Axis:   56 Text Interpretation:  Sinus tachycardia Septal infarct , age undetermined Abnormal ECG No old tracing to compare Confirmed by [facility] MD, Shaniquia Brafford [phone]) on 09/05/2016 4:17:17 PM       Radiology Dg Chest 2 View  Result Date: 09/05/2016 CLINICAL DATA:  Increased shortness of breath with right leg swelling. Chills starting at 10 a.m. today. EXAM: CHEST  2 VIEW COMPARISON:  None. FINDINGS: Two views study shows right base collapse/ consolidation with moderate to large right pleural effusion. Soft tissue fullness is noted in the right mediastinum. Left lung appears clear. Cardiopericardial silhouette is at upper limits of normal for size. Patient is status post median sternotomy bones are diffusely demineralized. IMPRESSION: Moderate to large right pleural effusion with right mediastinal fullness in right base collapse/ consolidation. Unilateral disease raises concern for underlying neoplasm. Electronically Signed   By: Misty Stanley M.D.   On: 09/05/2016 17:48   Ct Angio Chest Pe W And/or Wo Contrast  Result Date: 09/05/2016 CLINICAL DATA:  Chest pain, right neck pain and abdominal pain since this morning. EXAM: CT ANGIOGRAPHY CHEST CT ABDOMEN AND PELVIS WITH CONTRAST TECHNIQUE: Multidetector CT imaging of the chest was performed using the standard protocol during bolus administration of intravenous contrast. Multiplanar CT image reconstructions and MIPs were obtained to evaluate the vascular anatomy. Multidetector CT imaging of the abdomen and pelvis was performed using the standard protocol during bolus administration of intravenous contrast. CONTRAST:  100 cc Isovue 370 COMPARISON:  None. FINDINGS: CTA CHEST FINDINGS  Chest wall: No breast masses, supraclavicular or axillary lymphadenopathy. The thyroid gland appears normal. Cardiovascular: The heart is normal in size for age. No pericardial effusion. There is tortuosity, ectasia and calcification of the thoracic aorta. No focal aneurysm or dissection. The branch vessels are patent. The pulmonary arterial tree is fairly well opacified. No filling defects to suggest pulmonary embolism. Mediastinum/Nodes: Borderline mediastinal lymph nodes. Largest right paratracheal node measures 12 mm on image number 30. Precarinal lymph node on image number 30 measures 7 mm. Aorticopulmonary window lymph node on image number 31 measures 8 mm. Subcarinal lymph node on image number 42 measures 13 mm. No hilar adenopathy. The esophagus  is grossly normal. Lungs/Pleura: Large right-sided pleural effusion with probable loculation. No obvious enhancing split pleura sign may suggest empyema. There is significant compressive atelectasis of the right middle lobe and right lower lobe. No enhancing pleural nodules. Thoracentesis may be helpful for diagnostic and therapeutic purposes. No left-sided pleural effusion. The left lung is clear. No worrisome pulmonary nodules. Azygos fissure is noted. Musculoskeletal: No significant bony findings. Review of the MIP images confirms the above findings. CT ABDOMEN and PELVIS FINDINGS Hepatobiliary: No focal hepatic lesions or intrahepatic biliary dilatation. Gallstones are noted in the gallbladder but no findings for acute cholecystitis. Normal caliber common bile duct. Pancreas: No mass, inflammation or ductal dilatation. Spleen: Normal size.  No focal lesions. Adrenals/Urinary Tract: The adrenal glands and kidneys are unremarkable. The bladder is normal. Stomach/Bowel: The stomach, duodenum, small bowel and colon are unremarkable. No inflammatory changes, mass lesions or obstructive findings. The terminal ileum is normal. The appendix is normal. Vascular/Lymphatic:  No mesenteric or retroperitoneal mass or adenopathy. Moderate atherosclerotic calcifications involving the aorta. The branch vessels patent. The major venous structures are patent. Reproductive: The endometrium is prominent and somewhat irregular for the patient's age. Could not exclude endometrial hyperplasia or neoplasm. Recommend endometrial sampling. The ovaries are normal. Other: No pelvic adenopathy. No free pelvic fluid collections. No inguinal mass or adenopathy. No abdominal wall hernia or subcutaneous lesions. Musculoskeletal:   No significant bony findings. Review of the MIP images confirms the above findings. IMPRESSION: 1. No CT findings for aortic dissection or aneurysm. 2. No CT findings for pulmonary embolism. 3. Large complex right pleural effusion, likely loculated. No obvious enhancing pleural nodules or masses. There is significant compressive atelectasis of the right middle lobe and right lower lobe. Thoracentesis may be helpful for diagnostic and therapeutic purposes. 4. No worrisome lung lesions or endobronchial mass. 5. No acute findings in the abdomen/pelvis. 6. Thickened and somewhat irregular endometrium. Recommend GYN consultation and endometrial sampling. Electronically Signed   By: Marijo Sanes M.D.   On: 09/05/2016 20:30   Ct Abdomen Pelvis W Contrast  Result Date: 09/05/2016 CLINICAL DATA:  Chest pain, right neck pain and abdominal pain since this morning. EXAM: CT ANGIOGRAPHY CHEST CT ABDOMEN AND PELVIS WITH CONTRAST TECHNIQUE: Multidetector CT imaging of the chest was performed using the standard protocol during bolus administration of intravenous contrast. Multiplanar CT image reconstructions and MIPs were obtained to evaluate the vascular anatomy. Multidetector CT imaging of the abdomen and pelvis was performed using the standard protocol during bolus administration of intravenous contrast. CONTRAST:  100 cc Isovue 370 COMPARISON:  None. FINDINGS: CTA CHEST FINDINGS Chest  wall: No breast masses, supraclavicular or axillary lymphadenopathy. The thyroid gland appears normal. Cardiovascular: The heart is normal in size for age. No pericardial effusion. There is tortuosity, ectasia and calcification of the thoracic aorta. No focal aneurysm or dissection. The branch vessels are patent. The pulmonary arterial tree is fairly well opacified. No filling defects to suggest pulmonary embolism. Mediastinum/Nodes: Borderline mediastinal lymph nodes. Largest right paratracheal node measures 12 mm on image number 30. Precarinal lymph node on image number 30 measures 7 mm. Aorticopulmonary window lymph node on image number 31 measures 8 mm. Subcarinal lymph node on image number 42 measures 13 mm. No hilar adenopathy. The esophagus is grossly normal. Lungs/Pleura: Large right-sided pleural effusion with probable loculation. No obvious enhancing split pleura sign may suggest empyema. There is significant compressive atelectasis of the right middle lobe and right lower lobe. No enhancing pleural nodules. Thoracentesis  may be helpful for diagnostic and therapeutic purposes. No left-sided pleural effusion. The left lung is clear. No worrisome pulmonary nodules. Azygos fissure is noted. Musculoskeletal: No significant bony findings. Review of the MIP images confirms the above findings. CT ABDOMEN and PELVIS FINDINGS Hepatobiliary: No focal hepatic lesions or intrahepatic biliary dilatation. Gallstones are noted in the gallbladder but no findings for acute cholecystitis. Normal caliber common bile duct. Pancreas: No mass, inflammation or ductal dilatation. Spleen: Normal size.  No focal lesions. Adrenals/Urinary Tract: The adrenal glands and kidneys are unremarkable. The bladder is normal. Stomach/Bowel: The stomach, duodenum, small bowel and colon are unremarkable. No inflammatory changes, mass lesions or obstructive findings. The terminal ileum is normal. The appendix is normal. Vascular/Lymphatic: No  mesenteric or retroperitoneal mass or adenopathy. Moderate atherosclerotic calcifications involving the aorta. The branch vessels patent. The major venous structures are patent. Reproductive: The endometrium is prominent and somewhat irregular for the patient's age. Could not exclude endometrial hyperplasia or neoplasm. Recommend endometrial sampling. The ovaries are normal. Other: No pelvic adenopathy. No free pelvic fluid collections. No inguinal mass or adenopathy. No abdominal wall hernia or subcutaneous lesions. Musculoskeletal:   No significant bony findings. Review of the MIP images confirms the above findings. IMPRESSION: 1. No CT findings for aortic dissection or aneurysm. 2. No CT findings for pulmonary embolism. 3. Large complex right pleural effusion, likely loculated. No obvious enhancing pleural nodules or masses. There is significant compressive atelectasis of the right middle lobe and right lower lobe. Thoracentesis may be helpful for diagnostic and therapeutic purposes. 4. No worrisome lung lesions or endobronchial mass. 5. No acute findings in the abdomen/pelvis. 6. Thickened and somewhat irregular endometrium. Recommend GYN consultation and endometrial sampling. Electronically Signed   By: Marijo Sanes M.D.   On: 09/05/2016 20:30    Procedures Procedures (including critical care time)  CRITICAL CARE Performed by: Merrily Pew Total critical care time: 35 minutes Critical care time was exclusive of separately billable procedures and treating other patients. Critical care was necessary to treat or prevent imminent or life-threatening deterioration. Critical care was time spent personally by me on the following activities: development of treatment plan with patient and/or surrogate as well as nursing, discussions with consultants, evaluation of patient's response to treatment, examination of patient, obtaining history from patient or surrogate, ordering and performing treatments and  interventions, ordering and review of laboratory studies, ordering and review of radiographic studies, pulse oximetry and re-evaluation of patient's condition.   Medications Ordered in ED Medications  iopamidol (ISOVUE-300) 61 % injection (not administered)  vancomycin (VANCOCIN) IVPB 1000 mg/200 mL premix (1,000 mg Intravenous New Bag/Given 09/05/16 2112)  iopamidol (ISOVUE-370) 76 % injection 100 mL (100 mLs Intravenous Contrast Given 09/05/16 1919)  piperacillin-tazobactam (ZOSYN) IVPB 3.375 g (3.375 g Intravenous New Bag/Given 09/05/16 2112)     Initial Impression / Assessment and Plan / ED Course  I have reviewed the triage vital signs and the nursing notes.  Pertinent labs & imaging results that were available during my care of the patient were reviewed by me and considered in my medical decision making (see chart for details).  Clinical Course    Large loculated pleural Effusion with uncertain etiology. Requiring oxygen. We'll admit to medicine for thoracentesis and further management of results. Antibiotics given in case it is infectious since it is loculated.   Final Clinical Impressions(s) / ED Diagnoses   Final diagnoses:  Pleural effusion  Hypokalemia  Chest wall pain  Acute respiratory failure with hypoxia (  [facility])    New Prescriptions New Prescriptions   No medications on file     Merrily Pew, MD 09/05/16 2147

## 2016-09-06 ENCOUNTER — Inpatient Hospital Stay (HOSPITAL_COMMUNITY): Payer: Medicare Other

## 2016-09-06 DIAGNOSIS — I429 Cardiomyopathy, unspecified: Secondary | ICD-10-CM

## 2016-09-06 LAB — COMPREHENSIVE METABOLIC PANEL
ALT: 45 U/L (ref 14–54)
AST: 36 U/L (ref 15–41)
Albumin: 2 g/dL — ABNORMAL LOW (ref 3.5–5.0)
Alkaline Phosphatase: 92 U/L (ref 38–126)
Anion gap: 7 (ref 5–15)
BUN: 11 mg/dL (ref 6–20)
CHLORIDE: 96 mmol/L — AB (ref 101–111)
CO2: 30 mmol/L (ref 22–32)
CREATININE: 0.79 mg/dL (ref 0.44–1.00)
Calcium: 7.8 mg/dL — ABNORMAL LOW (ref 8.9–10.3)
GFR calc Af Amer: >60 mL/min (ref >60)
GFR calc non Af Amer: >60 mL/min (ref >60)
GLUCOSE: 151 mg/dL — AB (ref 65–99)
Potassium: 3.3 mmol/L — ABNORMAL LOW (ref 3.5–5.1)
SODIUM: 133 mmol/L — AB (ref 135–145)
Total Bilirubin: 1.1 mg/dL (ref 0.3–1.2)
Total Protein: 6 g/dL — ABNORMAL LOW (ref 6.5–8.1)

## 2016-09-06 LAB — PROTEIN, BODY FLUID: Total protein, fluid: 3.8 g/dL

## 2016-09-06 LAB — GRAM STAIN

## 2016-09-06 LAB — BODY FLUID CELL COUNT WITH DIFFERENTIAL
Eos, Fluid: 0 %
LYMPHS FL: 6 %
Monocyte-Macrophage-Serous Fluid: 5 % — ABNORMAL LOW (ref 50–90)
NEUTROPHIL FLUID: 89 % — AB (ref 0–25)
OTHER CELLS FL: 0 %
WBC FLUID: 793343 uL — AB (ref 0–1000)

## 2016-09-06 LAB — GLUCOSE, SEROUS FLUID

## 2016-09-06 LAB — CBC
HCT: 32.3 % — ABNORMAL LOW (ref 36.0–46.0)
Hemoglobin: 10.6 g/dL — ABNORMAL LOW (ref 12.0–15.0)
MCH: 30.7 pg (ref 26.0–34.0)
MCHC: 32.8 g/dL (ref 30.0–36.0)
MCV: 93.6 fL (ref 78.0–100.0)
PLATELETS: 479 10*3/uL — AB (ref 150–400)
RBC: 3.45 MIL/uL — AB (ref 3.87–5.11)
RDW: 13.2 % (ref 11.5–15.5)
WBC: 15.6 10*3/uL — AB (ref 4.0–10.5)

## 2016-09-06 LAB — MRSA PCR SCREENING: MRSA BY PCR: NEGATIVE

## 2016-09-06 LAB — ECHOCARDIOGRAM COMPLETE
Height: 63 in
Weight: 3597.91 oz

## 2016-09-06 LAB — LACTATE DEHYDROGENASE, PLEURAL OR PERITONEAL FLUID: LD, Fluid: >10000 U/L — ABNORMAL HIGH (ref 3–23)

## 2016-09-06 MED ORDER — FUROSEMIDE 20 MG PO TABS
20.0000 mg | ORAL_TABLET | Freq: Every day | ORAL | Status: DC
  Administered 2016-09-06 – 2016-09-07 (×2): 20 mg via ORAL
  Filled 2016-09-06 (×3): qty 1

## 2016-09-06 MED ORDER — PIPERACILLIN-TAZOBACTAM 3.375 G IVPB
3.3750 g | Freq: Three times a day (TID) | INTRAVENOUS | Status: DC
Start: 2016-09-06 — End: 2016-09-07
  Administered 2016-09-06 – 2016-09-07 (×4): 3.375 g via INTRAVENOUS
  Filled 2016-09-06 (×7): qty 50

## 2016-09-06 MED ORDER — VANCOMYCIN HCL IN DEXTROSE 750-5 MG/150ML-% IV SOLN
750.0000 mg | Freq: Two times a day (BID) | INTRAVENOUS | Status: DC
  Administered 2016-09-06 – 2016-09-07 (×2): 750 mg via INTRAVENOUS
  Filled 2016-09-06 (×6): qty 150

## 2016-09-06 MED ORDER — PIPERACILLIN-TAZOBACTAM 3.375 G IVPB
3.3750 g | Freq: Once | INTRAVENOUS | Status: AC
  Administered 2016-09-06: 3.375 g via INTRAVENOUS
  Filled 2016-09-06: qty 50

## 2016-09-06 MED ORDER — POTASSIUM CHLORIDE CRYS ER 20 MEQ PO TBCR
20.0000 meq | EXTENDED_RELEASE_TABLET | Freq: Three times a day (TID) | ORAL | Status: DC
  Administered 2016-09-06 – 2016-09-07 (×3): 20 meq via ORAL
  Filled 2016-09-06 (×4): qty 1

## 2016-09-06 MED ORDER — VANCOMYCIN HCL IN DEXTROSE 1-5 GM/200ML-% IV SOLN
1000.0000 mg | Freq: Once | INTRAVENOUS | Status: AC
  Administered 2016-09-06: 1000 mg via INTRAVENOUS
  Filled 2016-09-06: qty 200

## 2016-09-06 NOTE — Procedures (Signed)
PreOperative Dx: RIGHT pleural effusion Postoperative Dx: RIGHT empyema Procedure:   US guided RIGHT thoracentesis Radiologist:  [facility] Papas Anesthesia:  10 ml of 1% lidocaine Specimen:  60 L of purulent fluid EBL:   < 1 ml Complications: None

## 2016-09-06 NOTE — Progress Notes (Signed)
*  PRELIMINARY RESULTS* Echocardiogram 2D Echocardiogram has been performed.  Leavy Cella 09/06/2016, 4:50 PM

## 2016-09-06 NOTE — Progress Notes (Signed)
Subjective: Shortness of breath is improved. She is breathing comfortably now at rest. No fever.  Objective: Vital signs in last 24 hours: Vitals:   09/06/16 0300 09/06/16 0400 09/06/16 0500 09/06/16 0600  BP: (!) 115/51 (!) 113/52 (!) 129/59 (!) 142/60  Pulse: 86 85 87 90  Resp: (!) 25 (!) 23 (!) 25 (!) 23  Temp:  98.2 F (36.8 C)    TempSrc:  Axillary    SpO2:  97% 95% 95%  Weight:   224 lb 13.9 oz (102 kg)   Height:       Weight change:   Intake/Output Summary (Last 24 hours) at 09/06/16 0742 Last data filed at 09/06/16 0600  Gross per 24 hour  Intake           615.16 ml  Output                0 ml  Net           615.16 ml    Physical Exam: Alert and comfortable. HEENT unremarkable. Lungs clear with dull breath sounds in the right base. Abdomen soft and nontender with no hepatosplenomegaly. Extremities reveal 1+ edema in the lower legs. Neurologic status at baseline.  Lab Results:    Results for orders placed or performed during the hospital encounter of 09/05/16 (from the past 24 hour(s))  CBC     Status: Abnormal   Collection Time: 09/05/16  5:11 PM  Result Value Ref Range   WBC 17.2 (H) 4.0 - 10.5 K/uL   RBC 3.63 (L) 3.87 - 5.11 MIL/uL   Hemoglobin 11.4 (L) 12.0 - 15.0 g/dL   HCT 33.7 (L) 36.0 - 46.0 %   MCV 92.8 78.0 - 100.0 fL   MCH 31.4 26.0 - 34.0 pg   MCHC 33.8 30.0 - 36.0 g/dL   RDW 13.0 11.5 - 15.5 %   Platelets 510 (H) 150 - 400 K/uL  Comprehensive metabolic panel     Status: Abnormal   Collection Time: 09/05/16  5:11 PM  Result Value Ref Range   Sodium 131 (L) 135 - 145 mmol/L   Potassium 3.0 (L) 3.5 - 5.1 mmol/L   Chloride 99 (L) 101 - 111 mmol/L   CO2 27 22 - 32 mmol/L   Glucose, Bld 133 (H) 65 - 99 mg/dL   BUN 16 6 - 20 mg/dL   Creatinine, Ser 0.88 0.44 - 1.00 mg/dL   Calcium 7.4 (L) 8.9 - 10.3 mg/dL   Total Protein 6.7 6.5 - 8.1 g/dL   Albumin 2.3 (L) 3.5 - 5.0 g/dL   AST 65 (H) 15 - 41 U/L   ALT 60 (H) 14 - 54 U/L   Alkaline Phosphatase  104 38 - 126 U/L   Total Bilirubin 0.8 0.3 - 1.2 mg/dL   GFR calc non Af Amer >60 >60 mL/min   GFR calc Af Amer >60 >60 mL/min   Anion gap 5 5 - 15  Troponin I     Status: None   Collection Time: 09/05/16  5:11 PM  Result Value Ref Range   Troponin I <0.03 <0.03 ng/mL  Brain natriuretic peptide     Status: Abnormal   Collection Time: 09/05/16  5:11 PM  Result Value Ref Range   B Natriuretic Peptide 127.0 (H) 0.0 - 100.0 pg/mL  Protime-INR     Status: None   Collection Time: 09/05/16  5:11 PM  Result Value Ref Range   Prothrombin Time 15.0 11.4 - 15.2 seconds  INR 1.17   CBC     Status: Abnormal   Collection Time: 09/06/16  5:03 AM  Result Value Ref Range   WBC 15.6 (H) 4.0 - 10.5 K/uL   RBC 3.45 (L) 3.87 - 5.11 MIL/uL   Hemoglobin 10.6 (L) 12.0 - 15.0 g/dL   HCT 32.3 (L) 36.0 - 46.0 %   MCV 93.6 78.0 - 100.0 fL   MCH 30.7 26.0 - 34.0 pg   MCHC 32.8 30.0 - 36.0 g/dL   RDW 13.2 11.5 - 15.5 %   Platelets 479 (H) 150 - 400 K/uL  Comprehensive metabolic panel     Status: Abnormal   Collection Time: 09/06/16  5:03 AM  Result Value Ref Range   Sodium 133 (L) 135 - 145 mmol/L   Potassium 3.3 (L) 3.5 - 5.1 mmol/L   Chloride 96 (L) 101 - 111 mmol/L   CO2 30 22 - 32 mmol/L   Glucose, Bld 151 (H) 65 - 99 mg/dL   BUN 11 6 - 20 mg/dL   Creatinine, Ser 0.79 0.44 - 1.00 mg/dL   Calcium 7.8 (L) 8.9 - 10.3 mg/dL   Total Protein 6.0 (L) 6.5 - 8.1 g/dL   Albumin 2.0 (L) 3.5 - 5.0 g/dL   AST 36 15 - 41 U/L   ALT 45 14 - 54 U/L   Alkaline Phosphatase 92 38 - 126 U/L   Total Bilirubin 1.1 0.3 - 1.2 mg/dL   GFR calc non Af Amer >60 >60 mL/min   GFR calc Af Amer >60 >60 mL/min   Anion gap 7 5 - 15     ABGS No results for input(s): PHART, PO2ART, TCO2, HCO3 in the last 72 hours.  Invalid input(s): PCO2 CULTURES No results found for this or any previous visit (from the past 240 hour(s)). Studies/Results: Dg Chest 2 View  Result Date: 09/05/2016 CLINICAL DATA:  Increased shortness  of breath with right leg swelling. Chills starting at 10 a.m. today. EXAM: CHEST  2 VIEW COMPARISON:  None. FINDINGS: Two views study shows right base collapse/ consolidation with moderate to large right pleural effusion. Soft tissue fullness is noted in the right mediastinum. Left lung appears clear. Cardiopericardial silhouette is at upper limits of normal for size. Patient is status post median sternotomy bones are diffusely demineralized. IMPRESSION: Moderate to large right pleural effusion with right mediastinal fullness in right base collapse/ consolidation. Unilateral disease raises concern for underlying neoplasm. Electronically Signed   By: Misty Stanley M.D.   On: 09/05/2016 17:48   Ct Angio Chest Pe W And/or Wo Contrast  Result Date: 09/05/2016 CLINICAL DATA:  Chest pain, right neck pain and abdominal pain since this morning. EXAM: CT ANGIOGRAPHY CHEST CT ABDOMEN AND PELVIS WITH CONTRAST TECHNIQUE: Multidetector CT imaging of the chest was performed using the standard protocol during bolus administration of intravenous contrast. Multiplanar CT image reconstructions and MIPs were obtained to evaluate the vascular anatomy. Multidetector CT imaging of the abdomen and pelvis was performed using the standard protocol during bolus administration of intravenous contrast. CONTRAST:  100 cc Isovue 370 COMPARISON:  None. FINDINGS: CTA CHEST FINDINGS Chest wall: No breast masses, supraclavicular or axillary lymphadenopathy. The thyroid gland appears normal. Cardiovascular: The heart is normal in size for age. No pericardial effusion. There is tortuosity, ectasia and calcification of the thoracic aorta. No focal aneurysm or dissection. The branch vessels are patent. The pulmonary arterial tree is fairly well opacified. No filling defects to suggest pulmonary embolism. Mediastinum/Nodes: Borderline mediastinal  lymph nodes. Largest right paratracheal node measures 12 mm on image number 30. Precarinal lymph node on  image number 30 measures 7 mm. Aorticopulmonary window lymph node on image number 31 measures 8 mm. Subcarinal lymph node on image number 42 measures 13 mm. No hilar adenopathy. The esophagus is grossly normal. Lungs/Pleura: Large right-sided pleural effusion with probable loculation. No obvious enhancing split pleura sign may suggest empyema. There is significant compressive atelectasis of the right middle lobe and right lower lobe. No enhancing pleural nodules. Thoracentesis may be helpful for diagnostic and therapeutic purposes. No left-sided pleural effusion. The left lung is clear. No worrisome pulmonary nodules. Azygos fissure is noted. Musculoskeletal: No significant bony findings. Review of the MIP images confirms the above findings. CT ABDOMEN and PELVIS FINDINGS Hepatobiliary: No focal hepatic lesions or intrahepatic biliary dilatation. Gallstones are noted in the gallbladder but no findings for acute cholecystitis. Normal caliber common bile duct. Pancreas: No mass, inflammation or ductal dilatation. Spleen: Normal size.  No focal lesions. Adrenals/Urinary Tract: The adrenal glands and kidneys are unremarkable. The bladder is normal. Stomach/Bowel: The stomach, duodenum, small bowel and colon are unremarkable. No inflammatory changes, mass lesions or obstructive findings. The terminal ileum is normal. The appendix is normal. Vascular/Lymphatic: No mesenteric or retroperitoneal mass or adenopathy. Moderate atherosclerotic calcifications involving the aorta. The branch vessels patent. The major venous structures are patent. Reproductive: The endometrium is prominent and somewhat irregular for the patient's age. Could not exclude endometrial hyperplasia or neoplasm. Recommend endometrial sampling. The ovaries are normal. Other: No pelvic adenopathy. No free pelvic fluid collections. No inguinal mass or adenopathy. No abdominal wall hernia or subcutaneous lesions. Musculoskeletal:   No significant bony  findings. Review of the MIP images confirms the above findings. IMPRESSION: 1. No CT findings for aortic dissection or aneurysm. 2. No CT findings for pulmonary embolism. 3. Large complex right pleural effusion, likely loculated. No obvious enhancing pleural nodules or masses. There is significant compressive atelectasis of the right middle lobe and right lower lobe. Thoracentesis may be helpful for diagnostic and therapeutic purposes. 4. No worrisome lung lesions or endobronchial mass. 5. No acute findings in the abdomen/pelvis. 6. Thickened and somewhat irregular endometrium. Recommend GYN consultation and endometrial sampling. Electronically Signed   By: Marijo Sanes M.D.   On: 09/05/2016 20:30   Ct Abdomen Pelvis W Contrast  Result Date: 09/05/2016 CLINICAL DATA:  Chest pain, right neck pain and abdominal pain since this morning. EXAM: CT ANGIOGRAPHY CHEST CT ABDOMEN AND PELVIS WITH CONTRAST TECHNIQUE: Multidetector CT imaging of the chest was performed using the standard protocol during bolus administration of intravenous contrast. Multiplanar CT image reconstructions and MIPs were obtained to evaluate the vascular anatomy. Multidetector CT imaging of the abdomen and pelvis was performed using the standard protocol during bolus administration of intravenous contrast. CONTRAST:  100 cc Isovue 370 COMPARISON:  None. FINDINGS: CTA CHEST FINDINGS Chest wall: No breast masses, supraclavicular or axillary lymphadenopathy. The thyroid gland appears normal. Cardiovascular: The heart is normal in size for age. No pericardial effusion. There is tortuosity, ectasia and calcification of the thoracic aorta. No focal aneurysm or dissection. The branch vessels are patent. The pulmonary arterial tree is fairly well opacified. No filling defects to suggest pulmonary embolism. Mediastinum/Nodes: Borderline mediastinal lymph nodes. Largest right paratracheal node measures 12 mm on image number 30. Precarinal lymph node on  image number 30 measures 7 mm. Aorticopulmonary window lymph node on image number 31 measures 8 mm. Subcarinal lymph node on  image number 42 measures 13 mm. No hilar adenopathy. The esophagus is grossly normal. Lungs/Pleura: Large right-sided pleural effusion with probable loculation. No obvious enhancing split pleura sign may suggest empyema. There is significant compressive atelectasis of the right middle lobe and right lower lobe. No enhancing pleural nodules. Thoracentesis may be helpful for diagnostic and therapeutic purposes. No left-sided pleural effusion. The left lung is clear. No worrisome pulmonary nodules. Azygos fissure is noted. Musculoskeletal: No significant bony findings. Review of the MIP images confirms the above findings. CT ABDOMEN and PELVIS FINDINGS Hepatobiliary: No focal hepatic lesions or intrahepatic biliary dilatation. Gallstones are noted in the gallbladder but no findings for acute cholecystitis. Normal caliber common bile duct. Pancreas: No mass, inflammation or ductal dilatation. Spleen: Normal size.  No focal lesions. Adrenals/Urinary Tract: The adrenal glands and kidneys are unremarkable. The bladder is normal. Stomach/Bowel: The stomach, duodenum, small bowel and colon are unremarkable. No inflammatory changes, mass lesions or obstructive findings. The terminal ileum is normal. The appendix is normal. Vascular/Lymphatic: No mesenteric or retroperitoneal mass or adenopathy. Moderate atherosclerotic calcifications involving the aorta. The branch vessels patent. The major venous structures are patent. Reproductive: The endometrium is prominent and somewhat irregular for the patient's age. Could not exclude endometrial hyperplasia or neoplasm. Recommend endometrial sampling. The ovaries are normal. Other: No pelvic adenopathy. No free pelvic fluid collections. No inguinal mass or adenopathy. No abdominal wall hernia or subcutaneous lesions. Musculoskeletal:   No significant bony  findings. Review of the MIP images confirms the above findings. IMPRESSION: 1. No CT findings for aortic dissection or aneurysm. 2. No CT findings for pulmonary embolism. 3. Large complex right pleural effusion, likely loculated. No obvious enhancing pleural nodules or masses. There is significant compressive atelectasis of the right middle lobe and right lower lobe. Thoracentesis may be helpful for diagnostic and therapeutic purposes. 4. No worrisome lung lesions or endobronchial mass. 5. No acute findings in the abdomen/pelvis. 6. Thickened and somewhat irregular endometrium. Recommend GYN consultation and endometrial sampling. Electronically Signed   By: Marijo Sanes M.D.   On: 09/05/2016 20:30   Micro Results: No results found for this or any previous visit (from the past 240 hour(s)). Studies/Results: Dg Chest 2 View  Result Date: 09/05/2016 CLINICAL DATA:  Increased shortness of breath with right leg swelling. Chills starting at 10 a.m. today. EXAM: CHEST  2 VIEW COMPARISON:  None. FINDINGS: Two views study shows right base collapse/ consolidation with moderate to large right pleural effusion. Soft tissue fullness is noted in the right mediastinum. Left lung appears clear. Cardiopericardial silhouette is at upper limits of normal for size. Patient is status post median sternotomy bones are diffusely demineralized. IMPRESSION: Moderate to large right pleural effusion with right mediastinal fullness in right base collapse/ consolidation. Unilateral disease raises concern for underlying neoplasm. Electronically Signed   By: Misty Stanley M.D.   On: 09/05/2016 17:48   Ct Angio Chest Pe W And/or Wo Contrast  Result Date: 09/05/2016 CLINICAL DATA:  Chest pain, right neck pain and abdominal pain since this morning. EXAM: CT ANGIOGRAPHY CHEST CT ABDOMEN AND PELVIS WITH CONTRAST TECHNIQUE: Multidetector CT imaging of the chest was performed using the standard protocol during bolus administration of  intravenous contrast. Multiplanar CT image reconstructions and MIPs were obtained to evaluate the vascular anatomy. Multidetector CT imaging of the abdomen and pelvis was performed using the standard protocol during bolus administration of intravenous contrast. CONTRAST:  100 cc Isovue 370 COMPARISON:  None. FINDINGS: CTA CHEST FINDINGS  Chest wall: No breast masses, supraclavicular or axillary lymphadenopathy. The thyroid gland appears normal. Cardiovascular: The heart is normal in size for age. No pericardial effusion. There is tortuosity, ectasia and calcification of the thoracic aorta. No focal aneurysm or dissection. The branch vessels are patent. The pulmonary arterial tree is fairly well opacified. No filling defects to suggest pulmonary embolism. Mediastinum/Nodes: Borderline mediastinal lymph nodes. Largest right paratracheal node measures 12 mm on image number 30. Precarinal lymph node on image number 30 measures 7 mm. Aorticopulmonary window lymph node on image number 31 measures 8 mm. Subcarinal lymph node on image number 42 measures 13 mm. No hilar adenopathy. The esophagus is grossly normal. Lungs/Pleura: Large right-sided pleural effusion with probable loculation. No obvious enhancing split pleura sign may suggest empyema. There is significant compressive atelectasis of the right middle lobe and right lower lobe. No enhancing pleural nodules. Thoracentesis may be helpful for diagnostic and therapeutic purposes. No left-sided pleural effusion. The left lung is clear. No worrisome pulmonary nodules. Azygos fissure is noted. Musculoskeletal: No significant bony findings. Review of the MIP images confirms the above findings. CT ABDOMEN and PELVIS FINDINGS Hepatobiliary: No focal hepatic lesions or intrahepatic biliary dilatation. Gallstones are noted in the gallbladder but no findings for acute cholecystitis. Normal caliber common bile duct. Pancreas: No mass, inflammation or ductal dilatation. Spleen:  Normal size.  No focal lesions. Adrenals/Urinary Tract: The adrenal glands and kidneys are unremarkable. The bladder is normal. Stomach/Bowel: The stomach, duodenum, small bowel and colon are unremarkable. No inflammatory changes, mass lesions or obstructive findings. The terminal ileum is normal. The appendix is normal. Vascular/Lymphatic: No mesenteric or retroperitoneal mass or adenopathy. Moderate atherosclerotic calcifications involving the aorta. The branch vessels patent. The major venous structures are patent. Reproductive: The endometrium is prominent and somewhat irregular for the patient's age. Could not exclude endometrial hyperplasia or neoplasm. Recommend endometrial sampling. The ovaries are normal. Other: No pelvic adenopathy. No free pelvic fluid collections. No inguinal mass or adenopathy. No abdominal wall hernia or subcutaneous lesions. Musculoskeletal:   No significant bony findings. Review of the MIP images confirms the above findings. IMPRESSION: 1. No CT findings for aortic dissection or aneurysm. 2. No CT findings for pulmonary embolism. 3. Large complex right pleural effusion, likely loculated. No obvious enhancing pleural nodules or masses. There is significant compressive atelectasis of the right middle lobe and right lower lobe. Thoracentesis may be helpful for diagnostic and therapeutic purposes. 4. No worrisome lung lesions or endobronchial mass. 5. No acute findings in the abdomen/pelvis. 6. Thickened and somewhat irregular endometrium. Recommend GYN consultation and endometrial sampling. Electronically Signed   By: Marijo Sanes M.D.   On: 09/05/2016 20:30   Ct Abdomen Pelvis W Contrast  Result Date: 09/05/2016 CLINICAL DATA:  Chest pain, right neck pain and abdominal pain since this morning. EXAM: CT ANGIOGRAPHY CHEST CT ABDOMEN AND PELVIS WITH CONTRAST TECHNIQUE: Multidetector CT imaging of the chest was performed using the standard protocol during bolus administration of  intravenous contrast. Multiplanar CT image reconstructions and MIPs were obtained to evaluate the vascular anatomy. Multidetector CT imaging of the abdomen and pelvis was performed using the standard protocol during bolus administration of intravenous contrast. CONTRAST:  100 cc Isovue 370 COMPARISON:  None. FINDINGS: CTA CHEST FINDINGS Chest wall: No breast masses, supraclavicular or axillary lymphadenopathy. The thyroid gland appears normal. Cardiovascular: The heart is normal in size for age. No pericardial effusion. There is tortuosity, ectasia and calcification of the thoracic aorta. No focal aneurysm  or dissection. The branch vessels are patent. The pulmonary arterial tree is fairly well opacified. No filling defects to suggest pulmonary embolism. Mediastinum/Nodes: Borderline mediastinal lymph nodes. Largest right paratracheal node measures 12 mm on image number 30. Precarinal lymph node on image number 30 measures 7 mm. Aorticopulmonary window lymph node on image number 31 measures 8 mm. Subcarinal lymph node on image number 42 measures 13 mm. No hilar adenopathy. The esophagus is grossly normal. Lungs/Pleura: Large right-sided pleural effusion with probable loculation. No obvious enhancing split pleura sign may suggest empyema. There is significant compressive atelectasis of the right middle lobe and right lower lobe. No enhancing pleural nodules. Thoracentesis may be helpful for diagnostic and therapeutic purposes. No left-sided pleural effusion. The left lung is clear. No worrisome pulmonary nodules. Azygos fissure is noted. Musculoskeletal: No significant bony findings. Review of the MIP images confirms the above findings. CT ABDOMEN and PELVIS FINDINGS Hepatobiliary: No focal hepatic lesions or intrahepatic biliary dilatation. Gallstones are noted in the gallbladder but no findings for acute cholecystitis. Normal caliber common bile duct. Pancreas: No mass, inflammation or ductal dilatation. Spleen:  Normal size.  No focal lesions. Adrenals/Urinary Tract: The adrenal glands and kidneys are unremarkable. The bladder is normal. Stomach/Bowel: The stomach, duodenum, small bowel and colon are unremarkable. No inflammatory changes, mass lesions or obstructive findings. The terminal ileum is normal. The appendix is normal. Vascular/Lymphatic: No mesenteric or retroperitoneal mass or adenopathy. Moderate atherosclerotic calcifications involving the aorta. The branch vessels patent. The major venous structures are patent. Reproductive: The endometrium is prominent and somewhat irregular for the patient's age. Could not exclude endometrial hyperplasia or neoplasm. Recommend endometrial sampling. The ovaries are normal. Other: No pelvic adenopathy. No free pelvic fluid collections. No inguinal mass or adenopathy. No abdominal wall hernia or subcutaneous lesions. Musculoskeletal:   No significant bony findings. Review of the MIP images confirms the above findings. IMPRESSION: 1. No CT findings for aortic dissection or aneurysm. 2. No CT findings for pulmonary embolism. 3. Large complex right pleural effusion, likely loculated. No obvious enhancing pleural nodules or masses. There is significant compressive atelectasis of the right middle lobe and right lower lobe. Thoracentesis may be helpful for diagnostic and therapeutic purposes. 4. No worrisome lung lesions or endobronchial mass. 5. No acute findings in the abdomen/pelvis. 6. Thickened and somewhat irregular endometrium. Recommend GYN consultation and endometrial sampling. Electronically Signed   By: Marijo Sanes M.D.   On: 09/05/2016 20:30   Medications:  I have reviewed the patient's current medications Scheduled Meds: . donepezil  10 mg Oral QHS  . furosemide  20 mg Oral Daily  . metoprolol succinate  25 mg Oral Daily  . piperacillin-tazobactam (ZOSYN)  IV  3.375 g Intravenous Once  . potassium chloride  20 mEq Oral TID  . rosuvastatin  5 mg Oral Daily    Continuous Infusions: . sodium chloride 10 mL/hr (09/05/16 2329)   PRN Meds:.ondansetron **OR** ondansetron (ZOFRAN) IV   Assessment/Plan: #1. Large right pleural effusion. Plan thoracentesis today which has already been ordered. She is being treated for pneumonia however CT reveals atelectasis. Leukocytosis is improved. Again, no fever.  Consult pulmonology. #2. Thickened endometrium. Plan gynecology consult as an outpatient. #3. Hypokalemia. 3.3 today. Additional replacement orally ordered. #4. Status post aortic valve replacement. Echo ordered for today. #5. Dementia. Continue donepezil. #6. Increased LFTs. Normalized today. Active Problems:   MCI (mild cognitive impairment) with memory loss   Pleural effusion   Pleural effusion on right  LOS: 1 day   Aero Drummonds 09/06/2016, 7:42 AM

## 2016-09-06 NOTE — Progress Notes (Signed)
CRITICAL VALUE ALERT  Critical value received:  Pleural fluid- gram positive cocci   Date of notification:  09/06/16  Time of notification:  [id number]  Critical value read back:Yes.    Nurse who received alert:  Janace Aris  MD notified (1st page):  Willey Blade MD  Time of first page:  [phone]  MD notified (2nd page): Anastasio Champion MD  Time of second page:[phone]

## 2016-09-06 NOTE — Progress Notes (Signed)
Thoracentesis complete no signs of distress. 60 ml purulent pleural fluid removed.

## 2016-09-06 NOTE — Progress Notes (Signed)
ANTIBIOTIC CONSULT NOTE-Preliminary  Pharmacy Consult for Vancomycin and Zosyn Indication: Pneumonia  Allergies  Allergen Reactions  . Atorvastatin Other (See Comments)    Muscle pain    Patient Measurements: Height: 5\' 3"  (160 cm) Weight: 224 lb 13.9 oz (102 kg) IBW/kg (Calculated) : 52.4  Vital Signs: Temp: 98.2 F (36.8 C) (09/21 2300) Temp Source: Oral (09/21 2300) BP: 128/61 (09/21 2200) Pulse Rate: 90 (09/21 2200)  Labs:  Recent Labs  09/05/16 1711  WBC 17.2*  HGB 11.4*  PLT 510*  CREATININE 0.88    Estimated Creatinine Clearance: 68.8 mL/min (by C-G formula based on SCr of 0.88 mg/dL).  No results for input(s): VANCOTROUGH, VANCOPEAK, VANCORANDOM, GENTTROUGH, GENTPEAK, GENTRANDOM, TOBRATROUGH, TOBRAPEAK, TOBRARND, AMIKACINPEAK, AMIKACINTROU, AMIKACIN in the last 72 hours.   Microbiology: No results found for this or any previous visit (from the past 720 hour(s)).  Medical History: Past Medical History:  Diagnosis Date  . Carpal tunnel syndrome   . H/O aortic valve replacement   . High blood pressure   . High cholesterol   . Kidney stones     Medications:  Vancomycin 1 Gm IV in the ED at @2200  09/05/16 Zosyn 3.375 Gm IV in the ED at @2200  09/05/16  Assessment: 70 yo female with increased SOB x 1 Week. Pt also reports chills, sweats, cough and fatigue. Chest x-ray shows right pleural effusion. Empiric antibiotics for r/o pneumonia.  Goal of Therapy:  Vancomycin troughs 15-20 mcg/ml Eradicate infection  Plan:  Preliminary review of pertinent patient information completed.  Protocol will be initiated with a one-time doses of Vancomycin 1 Gm IV in addition to the 1 Gm dose in the ED, for a total loading dose of 2 Gm IV, and Zosyn 3.375 Gm IV @ 8 hours after the initial ED dose.[place] clinical pharmacist will complete review during morning rounds to assess patient and finalize treatment regimen.  Norberto Sorenson, [facility] 09/06/2016,2:42 AM

## 2016-09-06 NOTE — Care Management Important Message (Signed)
Important Message  Patient Details  Name: JAYANA SCHALOW MRN: [id number] Date of Birth: 09-19-1946   Medicare Important Message Given:  Yes    Atavia Poppe, [facility] [place], RN 09/06/2016, 12:57 PM

## 2016-09-06 NOTE — Care Management Note (Signed)
Case Management Note  Patient Details  Name: Judith Schroeder MRN: [id number] Date of Birth: 12-06-1946  Subjective/Objective:  Patient adm from home with pleural effusion. She is ind with ADL's. Daughter present at bedside during assessment. Patient has a PCP and insurance, reports no issues.                   Action/Plan: Anticipate will DC home with self care. Will follow for further needs. Pending thoracentesis today.  Expected Discharge Date:         09/07/2016         Expected Discharge Plan:  Home/Self Care  In-House Referral:  NA  Discharge planning Services  CM Consult  Post Acute Care Choice:  NA Choice offered to:  NA  DME Arranged:    DME Agency:     HH Arranged:    HH Agency:     Status of Service:  In process, will continue to follow  If discussed at Long Length of Stay Meetings, dates discussed:    Additional Comments:  Anessa Charley, [facility] [place], RN 09/06/2016, 12:47 PM

## 2016-09-06 NOTE — Consult Note (Signed)
Consult requested by: Dr. Willey Blade Consult requested for pleural effusion:  HPI: This is a 70 year old who has developed increasing shortness of breath over the last 5 days or so. She says she's been coughing nonproductively. She has not had fever but says she feels like she's chilly but that is a chronic condition for her. Chest CT shows a large right pleural effusion with some suggestion of loculation. She is unaware of any history of lung problems. She has had aortic valve replacement and high blood pressure  Past Medical History:  Diagnosis Date  . Carpal tunnel syndrome   . H/O aortic valve replacement   . High blood pressure   . High cholesterol   . Kidney stones      Family History  Problem Relation Age of Onset  . Hypertension Mother   . Cancer Father     head and neck      Social History   Social History  . Marital status: Widowed    Spouse name: N/A  . Number of children: 1  . Years of education: 12+   Occupational History  . Accountant     Social History Main Topics  . Smoking status: Never Smoker  . Smokeless tobacco: Never Used  . Alcohol use No  . Drug use: No  . Sexual activity: Not Asked   Other Topics Concern  . None   Social History Narrative   Patient lives at home alone.    Patient is widowed.    Patient has 1 child   Patient has 2 years of college   Patient is right handed    Caffeine use: Drinks 2 cups coffee per day   2-3 glasses tea per day   No soda         ROS: No weight loss. No definite fever. No chest pain. Otherwise per the history and physical which I have reviewed    Objective: Vital signs in last 24 hours: Temp:  [98.2 F (36.8 C)-98.7 F (37.1 C)] 98.2 F (36.8 C) (09/22 0400) Pulse Rate:  [84-106] 90 (09/22 0600) Resp:  [19-30] 23 (09/22 0600) BP: (113-173)/(51-79) 142/60 (09/22 0600) SpO2:  [92 %-98 %] 95 % (09/22 0600) Weight:  [90.7 kg (200 lb)-102 kg (224 lb 13.9 oz)] 102 kg (224 lb 13.9 oz) (09/22 0500) Weight  change:  Last BM Date: 09/05/16  Intake/Output from previous day: 09/21 0701 - 09/22 0700 In: 615.2 [I.V.:65.2; IV Piggyback:550] Out: -   PHYSICAL EXAM She is awake and alert. She looks pretty comfortable. Her pupils are reactive nose and throat are clear. Her neck is supple. She has diminished breath sounds on the right. Her heart is regular. Her abdomen is soft. Central nervous system exam grossly intact  Lab Results: Basic Metabolic Panel:  Recent Labs  09/05/16 1711 09/06/16 0503  NA 131* 133*  K 3.0* 3.3*  CL 99* 96*  CO2 27 30  GLUCOSE 133* 151*  BUN 16 11  CREATININE 0.88 0.79  CALCIUM 7.4* 7.8*   Liver Function Tests:  Recent Labs  09/05/16 1711 09/06/16 0503  AST 65* 36  ALT 60* 45  ALKPHOS 104 92  BILITOT 0.8 1.1  PROT 6.7 6.0*  ALBUMIN 2.3* 2.0*   No results for input(s): LIPASE, AMYLASE in the last 72 hours. No results for input(s): AMMONIA in the last 72 hours. CBC:  Recent Labs  09/05/16 1711 09/06/16 0503  WBC 17.2* 15.6*  HGB 11.4* 10.6*  HCT 33.7* 32.3*  MCV 92.8  93.6  PLT 510* 479*   Cardiac Enzymes:  Recent Labs  09/05/16 1711  TROPONINI <0.03   BNP: No results for input(s): PROBNP in the last 72 hours. D-Dimer: No results for input(s): DDIMER in the last 72 hours. CBG: No results for input(s): GLUCAP in the last 72 hours. Hemoglobin A1C: No results for input(s): HGBA1C in the last 72 hours. Fasting Lipid Panel: No results for input(s): CHOL, HDL, LDLCALC, TRIG, CHOLHDL, LDLDIRECT in the last 72 hours. Thyroid Function Tests: No results for input(s): TSH, T4TOTAL, FREET4, T3FREE, THYROIDAB in the last 72 hours. Anemia Panel: No results for input(s): VITAMINB12, FOLATE, FERRITIN, TIBC, IRON, RETICCTPCT in the last 72 hours. Coagulation:  Recent Labs  09/05/16 1711  LABPROT 15.0  INR 1.17   Urine Drug Screen: Drugs of Abuse  No results found for: LABOPIA, COCAINSCRNUR, LABBENZ, AMPHETMU, THCU, LABBARB  Alcohol  Level: No results for input(s): ETH in the last 72 hours. Urinalysis: No results for input(s): COLORURINE, LABSPEC, PHURINE, GLUCOSEU, HGBUR, BILIRUBINUR, KETONESUR, PROTEINUR, UROBILINOGEN, NITRITE, LEUKOCYTESUR in the last 72 hours.  Invalid input(s): APPERANCEUR Misc. Labs:   ABGS: No results for input(s): PHART, PO2ART, TCO2, HCO3 in the last 72 hours.  Invalid input(s): PCO2   MICROBIOLOGY: Recent Results (from the past 240 hour(s))  MRSA PCR Screening     Status: None   Collection Time: 09/05/16 10:49 PM  Result Value Ref Range Status   MRSA by PCR NEGATIVE NEGATIVE Final    Comment:        The GeneXpert MRSA Assay (FDA approved for NASAL specimens only), is one component of a comprehensive MRSA colonization surveillance program. It is not intended to diagnose MRSA infection nor to guide or monitor treatment for MRSA infections.     Studies/Results: Dg Chest 2 View  Result Date: 09/05/2016 CLINICAL DATA:  Increased shortness of breath with right leg swelling. Chills starting at 10 a.m. today. EXAM: CHEST  2 VIEW COMPARISON:  None. FINDINGS: Two views study shows right base collapse/ consolidation with moderate to large right pleural effusion. Soft tissue fullness is noted in the right mediastinum. Left lung appears clear. Cardiopericardial silhouette is at upper limits of normal for size. Patient is status post median sternotomy bones are diffusely demineralized. IMPRESSION: Moderate to large right pleural effusion with right mediastinal fullness in right base collapse/ consolidation. Unilateral disease raises concern for underlying neoplasm. Electronically Signed   By: Misty Stanley M.D.   On: 09/05/2016 17:48   Ct Angio Chest Pe W And/or Wo Contrast  Result Date: 09/05/2016 CLINICAL DATA:  Chest pain, right neck pain and abdominal pain since this morning. EXAM: CT ANGIOGRAPHY CHEST CT ABDOMEN AND PELVIS WITH CONTRAST TECHNIQUE: Multidetector CT imaging of the chest  was performed using the standard protocol during bolus administration of intravenous contrast. Multiplanar CT image reconstructions and MIPs were obtained to evaluate the vascular anatomy. Multidetector CT imaging of the abdomen and pelvis was performed using the standard protocol during bolus administration of intravenous contrast. CONTRAST:  100 cc Isovue 370 COMPARISON:  None. FINDINGS: CTA CHEST FINDINGS Chest wall: No breast masses, supraclavicular or axillary lymphadenopathy. The thyroid gland appears normal. Cardiovascular: The heart is normal in size for age. No pericardial effusion. There is tortuosity, ectasia and calcification of the thoracic aorta. No focal aneurysm or dissection. The branch vessels are patent. The pulmonary arterial tree is fairly well opacified. No filling defects to suggest pulmonary embolism. Mediastinum/Nodes: Borderline mediastinal lymph nodes. Largest right paratracheal node measures 12 mm  on image number 30. Precarinal lymph node on image number 30 measures 7 mm. Aorticopulmonary window lymph node on image number 31 measures 8 mm. Subcarinal lymph node on image number 42 measures 13 mm. No hilar adenopathy. The esophagus is grossly normal. Lungs/Pleura: Large right-sided pleural effusion with probable loculation. No obvious enhancing split pleura sign may suggest empyema. There is significant compressive atelectasis of the right middle lobe and right lower lobe. No enhancing pleural nodules. Thoracentesis may be helpful for diagnostic and therapeutic purposes. No left-sided pleural effusion. The left lung is clear. No worrisome pulmonary nodules. Azygos fissure is noted. Musculoskeletal: No significant bony findings. Review of the MIP images confirms the above findings. CT ABDOMEN and PELVIS FINDINGS Hepatobiliary: No focal hepatic lesions or intrahepatic biliary dilatation. Gallstones are noted in the gallbladder but no findings for acute cholecystitis. Normal caliber common  bile duct. Pancreas: No mass, inflammation or ductal dilatation. Spleen: Normal size.  No focal lesions. Adrenals/Urinary Tract: The adrenal glands and kidneys are unremarkable. The bladder is normal. Stomach/Bowel: The stomach, duodenum, small bowel and colon are unremarkable. No inflammatory changes, mass lesions or obstructive findings. The terminal ileum is normal. The appendix is normal. Vascular/Lymphatic: No mesenteric or retroperitoneal mass or adenopathy. Moderate atherosclerotic calcifications involving the aorta. The branch vessels patent. The major venous structures are patent. Reproductive: The endometrium is prominent and somewhat irregular for the patient's age. Could not exclude endometrial hyperplasia or neoplasm. Recommend endometrial sampling. The ovaries are normal. Other: No pelvic adenopathy. No free pelvic fluid collections. No inguinal mass or adenopathy. No abdominal wall hernia or subcutaneous lesions. Musculoskeletal:   No significant bony findings. Review of the MIP images confirms the above findings. IMPRESSION: 1. No CT findings for aortic dissection or aneurysm. 2. No CT findings for pulmonary embolism. 3. Large complex right pleural effusion, likely loculated. No obvious enhancing pleural nodules or masses. There is significant compressive atelectasis of the right middle lobe and right lower lobe. Thoracentesis may be helpful for diagnostic and therapeutic purposes. 4. No worrisome lung lesions or endobronchial mass. 5. No acute findings in the abdomen/pelvis. 6. Thickened and somewhat irregular endometrium. Recommend GYN consultation and endometrial sampling. Electronically Signed   By: Marijo Sanes M.D.   On: 09/05/2016 20:30   Ct Abdomen Pelvis W Contrast  Result Date: 09/05/2016 CLINICAL DATA:  Chest pain, right neck pain and abdominal pain since this morning. EXAM: CT ANGIOGRAPHY CHEST CT ABDOMEN AND PELVIS WITH CONTRAST TECHNIQUE: Multidetector CT imaging of the chest was  performed using the standard protocol during bolus administration of intravenous contrast. Multiplanar CT image reconstructions and MIPs were obtained to evaluate the vascular anatomy. Multidetector CT imaging of the abdomen and pelvis was performed using the standard protocol during bolus administration of intravenous contrast. CONTRAST:  100 cc Isovue 370 COMPARISON:  None. FINDINGS: CTA CHEST FINDINGS Chest wall: No breast masses, supraclavicular or axillary lymphadenopathy. The thyroid gland appears normal. Cardiovascular: The heart is normal in size for age. No pericardial effusion. There is tortuosity, ectasia and calcification of the thoracic aorta. No focal aneurysm or dissection. The branch vessels are patent. The pulmonary arterial tree is fairly well opacified. No filling defects to suggest pulmonary embolism. Mediastinum/Nodes: Borderline mediastinal lymph nodes. Largest right paratracheal node measures 12 mm on image number 30. Precarinal lymph node on image number 30 measures 7 mm. Aorticopulmonary window lymph node on image number 31 measures 8 mm. Subcarinal lymph node on image number 42 measures 13 mm. No hilar adenopathy. The  esophagus is grossly normal. Lungs/Pleura: Large right-sided pleural effusion with probable loculation. No obvious enhancing split pleura sign may suggest empyema. There is significant compressive atelectasis of the right middle lobe and right lower lobe. No enhancing pleural nodules. Thoracentesis may be helpful for diagnostic and therapeutic purposes. No left-sided pleural effusion. The left lung is clear. No worrisome pulmonary nodules. Azygos fissure is noted. Musculoskeletal: No significant bony findings. Review of the MIP images confirms the above findings. CT ABDOMEN and PELVIS FINDINGS Hepatobiliary: No focal hepatic lesions or intrahepatic biliary dilatation. Gallstones are noted in the gallbladder but no findings for acute cholecystitis. Normal caliber common bile  duct. Pancreas: No mass, inflammation or ductal dilatation. Spleen: Normal size.  No focal lesions. Adrenals/Urinary Tract: The adrenal glands and kidneys are unremarkable. The bladder is normal. Stomach/Bowel: The stomach, duodenum, small bowel and colon are unremarkable. No inflammatory changes, mass lesions or obstructive findings. The terminal ileum is normal. The appendix is normal. Vascular/Lymphatic: No mesenteric or retroperitoneal mass or adenopathy. Moderate atherosclerotic calcifications involving the aorta. The branch vessels patent. The major venous structures are patent. Reproductive: The endometrium is prominent and somewhat irregular for the patient's age. Could not exclude endometrial hyperplasia or neoplasm. Recommend endometrial sampling. The ovaries are normal. Other: No pelvic adenopathy. No free pelvic fluid collections. No inguinal mass or adenopathy. No abdominal wall hernia or subcutaneous lesions. Musculoskeletal:   No significant bony findings. Review of the MIP images confirms the above findings. IMPRESSION: 1. No CT findings for aortic dissection or aneurysm. 2. No CT findings for pulmonary embolism. 3. Large complex right pleural effusion, likely loculated. No obvious enhancing pleural nodules or masses. There is significant compressive atelectasis of the right middle lobe and right lower lobe. Thoracentesis may be helpful for diagnostic and therapeutic purposes. 4. No worrisome lung lesions or endobronchial mass. 5. No acute findings in the abdomen/pelvis. 6. Thickened and somewhat irregular endometrium. Recommend GYN consultation and endometrial sampling. Electronically Signed   By: Marijo Sanes M.D.   On: 09/05/2016 20:30    Medications:  Prior to Admission:  Prescriptions Prior to Admission  Medication Sig Dispense Refill Last Dose  . aspirin EC 325 MG tablet Take 325 mg by mouth daily.   09/05/2016 at Unknown time  . donepezil (ARICEPT) 10 MG tablet Take 1 tablet (10 mg  total) by mouth at bedtime. 30 tablet 12 09/04/2016 at Unknown time  . eplerenone (INSPRA) 25 MG tablet Take 25 mg by mouth daily.   09/05/2016 at Unknown time  . furosemide (LASIX) 20 MG tablet Take 20 mg by mouth daily.    09/05/2016 at Unknown time  . metoprolol succinate (TOPROL-XL) 25 MG 24 hr tablet Take 25 mg by mouth daily.   09/05/2016 at Unknown time  . rosuvastatin (CRESTOR) 5 MG tablet Take 5 mg by mouth daily.   09/05/2016 at Unknown time   Scheduled: . donepezil  10 mg Oral QHS  . furosemide  20 mg Oral Daily  . metoprolol succinate  25 mg Oral Daily  . piperacillin-tazobactam (ZOSYN)  IV  3.375 g Intravenous Once  . potassium chloride  20 mEq Oral TID  . rosuvastatin  5 mg Oral Daily   Continuous: . sodium chloride 10 mL/hr (09/05/16 2329)   [id number] **OR** ondansetron (ZOFRAN) IV  Assesment:She has a large right pleural effusion. She is being treated for pneumonia which I think is appropriate. The pleural effusion appears to show some evidence of loculation. I agree with plans for thoracentesis and  I have ordered laboratory work on that. Depending on her clinical response she may need VATS procedure because of the loculation. Active Problems:   MCI (mild cognitive impairment) with memory loss   Pleural effusion   Pleural effusion on right    Plan: Thoracentesis today.    LOS: 1 day   Abygayle Deltoro L 09/06/2016, 9:00 AM

## 2016-09-06 NOTE — Progress Notes (Signed)
Pharmacy Antibiotic Note  Judith Schroeder is a 70 y.o. female admitted on 09/05/2016 with pneumonia.  Pharmacy has been consulted for vancomycin and zosyn dosing.  Plan: Vancomycin 2000mg  load already given , then 750mg  IV every 12 hours.  Goal trough 15-20 mcg/mL. (MRSA PCR is negative, consider discontinue) Zosyn 3.375g IV q8h (4 hour infusion). F/U cultures Monitor V/S and labs Vancomycin levels as indicated  Height: 5\' 3"  (160 cm) Weight: 224 lb 13.9 oz (102 kg) IBW/kg (Calculated) : 52.4  Temp (24hrs), Avg:98.4 F (36.9 C), Min:98.2 F (36.8 C), Max:98.7 F (37.1 C)   Recent Labs Lab 09/05/16 1711 09/06/16 0503  WBC 17.2* 15.6*  CREATININE 0.88 0.79    Estimated Creatinine Clearance: 75.6 mL/min (by C-G formula based on SCr of 0.79 mg/dL).    Allergies  Allergen Reactions  . Atorvastatin Other (See Comments)    Muscle pain    Antimicrobials this admission: Vancomyin 9/22 >>  Zosyn 9/22 >>   Dose adjustments this admission: n/a  Microbiology results: 9/21 MRSA PCR: negative  Thank you for allowing pharmacy to be a part of this patient's care.  Isac Sarna, BS Pharm D, [place] Clinical Pharmacist Pager [phone] 09/06/2016 9:35 AM

## 2016-09-07 ENCOUNTER — Inpatient Hospital Stay (HOSPITAL_COMMUNITY): Payer: Medicare Other | Admitting: Anesthesiology

## 2016-09-07 ENCOUNTER — Encounter (HOSPITAL_COMMUNITY)
Admission: EM | Disposition: A | Discharge status: Home / Self Care | Payer: Self-pay | Source: Home / Self Care | Attending: Surgery

## 2016-09-07 ENCOUNTER — Inpatient Hospital Stay (HOSPITAL_COMMUNITY): Payer: Medicare Other

## 2016-09-07 ENCOUNTER — Encounter (HOSPITAL_COMMUNITY): Payer: Self-pay | Admitting: *Deleted

## 2016-09-07 DIAGNOSIS — J869 Pyothorax without fistula: Secondary | ICD-10-CM

## 2016-09-07 HISTORY — PX: VIDEO ASSISTED THORACOSCOPY (VATS)/EMPYEMA: SHX6172

## 2016-09-07 LAB — CBC
HCT: 34.9 % — ABNORMAL LOW (ref 36.0–46.0)
Hemoglobin: 11.1 g/dL — ABNORMAL LOW (ref 12.0–15.0)
MCH: 30.5 pg (ref 26.0–34.0)
MCHC: 31.8 g/dL (ref 30.0–36.0)
MCV: 95.9 fL (ref 78.0–100.0)
PLATELETS: 507 10*3/uL — AB (ref 150–400)
RBC: 3.64 MIL/uL — ABNORMAL LOW (ref 3.87–5.11)
RDW: 13.1 % (ref 11.5–15.5)
WBC: 19.5 10*3/uL — ABNORMAL HIGH (ref 4.0–10.5)

## 2016-09-07 LAB — BASIC METABOLIC PANEL
Anion gap: 7 (ref 5–15)
BUN: 13 mg/dL (ref 6–20)
CALCIUM: 8 mg/dL — AB (ref 8.9–10.3)
CHLORIDE: 98 mmol/L — AB (ref 101–111)
CO2: 30 mmol/L (ref 22–32)
CREATININE: 0.84 mg/dL (ref 0.44–1.00)
GFR calc Af Amer: >60 mL/min (ref >60)
GFR calc non Af Amer: >60 mL/min (ref >60)
GLUCOSE: 157 mg/dL — AB (ref 65–99)
Potassium: 3.6 mmol/L (ref 3.5–5.1)
Sodium: 135 mmol/L (ref 135–145)

## 2016-09-07 LAB — GLUCOSE, CAPILLARY
GLUCOSE-CAPILLARY: 210 mg/dL — AB (ref 65–99)
Glucose-Capillary: 186 mg/dL — ABNORMAL HIGH (ref 65–99)

## 2016-09-07 LAB — POCT I-STAT 3, ART BLOOD GAS (G3+)
ACID-BASE EXCESS: 9 mmol/L — AB (ref 0.0–2.0)
Bicarbonate: 32.6 mmol/L — ABNORMAL HIGH (ref 20.0–28.0)
O2 Saturation: 91 %
PH ART: 7.499 — AB (ref 7.350–7.450)
TCO2: 34 mmol/L (ref 0–100)
pCO2 arterial: 41.8 mmHg (ref 32.0–48.0)
pO2, Arterial: 56 mmHg — ABNORMAL LOW (ref 83.0–108.0)

## 2016-09-07 LAB — TYPE AND SCREEN
ABO/RH(D): O POS
Antibody Screen: NEGATIVE

## 2016-09-07 LAB — ABO/RH: ABO/RH(D): O POS

## 2016-09-07 SURGERY — VIDEO ASSISTED THORACOSCOPY (VATS)/EMPYEMA
Anesthesia: General | Site: Chest | Laterality: Right

## 2016-09-07 MED ORDER — METOCLOPRAMIDE HCL 5 MG/ML IJ SOLN
10.0000 mg | Freq: Four times a day (QID) | INTRAMUSCULAR | Status: AC
  Administered 2016-09-07 – 2016-09-09 (×6): 10 mg via INTRAVENOUS
  Filled 2016-09-07 (×7): qty 2

## 2016-09-07 MED ORDER — SUGAMMADEX SODIUM 500 MG/5ML IV SOLN
INTRAVENOUS | Status: AC
  Filled 2016-09-07: qty 5

## 2016-09-07 MED ORDER — LACTATED RINGERS IV SOLN
INTRAVENOUS | Status: DC | PRN
  Administered 2016-09-07: 14:00:00 via INTRAVENOUS

## 2016-09-07 MED ORDER — POTASSIUM CHLORIDE 10 MEQ/50ML IV SOLN: 10.0000 meq | Freq: Every day | INTRAVENOUS | Status: DC | PRN

## 2016-09-07 MED ORDER — ONDANSETRON HCL 4 MG/2ML IJ SOLN: 4.0000 mg | Freq: Four times a day (QID) | INTRAMUSCULAR | Status: DC | PRN

## 2016-09-07 MED ORDER — OXYCODONE HCL 5 MG PO TABS
5.0000 mg | ORAL_TABLET | ORAL | Status: DC | PRN
  Administered 2016-09-09: 5 mg via ORAL
  Filled 2016-09-07: qty 2

## 2016-09-07 MED ORDER — FENTANYL CITRATE (PF) 100 MCG/2ML IJ SOLN
INTRAMUSCULAR | Status: AC
  Administered 2016-09-07: 50 ug via INTRAVENOUS
  Filled 2016-09-07: qty 2

## 2016-09-07 MED ORDER — PHENYLEPHRINE HCL 10 MG/ML IJ SOLN
INTRAMUSCULAR | Status: DC | PRN
  Administered 2016-09-07 (×2): 80 ug via INTRAVENOUS

## 2016-09-07 MED ORDER — ENOXAPARIN SODIUM 40 MG/0.4ML ~~LOC~~ SOLN: 40.0000 mg | SUBCUTANEOUS | Status: DC

## 2016-09-07 MED ORDER — HYDROMORPHONE HCL 1 MG/ML IJ SOLN
INTRAMUSCULAR | Status: AC
  Filled 2016-09-07: qty 1

## 2016-09-07 MED ORDER — DIPHENHYDRAMINE HCL 12.5 MG/5ML PO ELIX: 12.5000 mg | ORAL_SOLUTION | Freq: Four times a day (QID) | ORAL | Status: DC | PRN

## 2016-09-07 MED ORDER — SENNOSIDES-DOCUSATE SODIUM 8.6-50 MG PO TABS
1.0000 | ORAL_TABLET | Freq: Every day | ORAL | Status: DC
  Administered 2016-09-07 – 2016-09-08 (×2): 1 via ORAL
  Filled 2016-09-07 (×3): qty 1

## 2016-09-07 MED ORDER — ROCURONIUM BROMIDE 10 MG/ML (PF) SYRINGE
PREFILLED_SYRINGE | INTRAVENOUS | Status: DC | PRN
  Administered 2016-09-07: 30 mg via INTRAVENOUS
  Administered 2016-09-07: 50 mg via INTRAVENOUS

## 2016-09-07 MED ORDER — MIDAZOLAM HCL 2 MG/2ML IJ SOLN
2.0000 mg | Freq: Once | INTRAMUSCULAR | Status: DC
  Filled 2016-09-07: qty 2

## 2016-09-07 MED ORDER — MIDAZOLAM HCL 2 MG/2ML IJ SOLN
INTRAMUSCULAR | Status: AC
  Administered 2016-09-07: 2 mg
  Filled 2016-09-07: qty 2

## 2016-09-07 MED ORDER — LIDOCAINE 2% (20 MG/ML) 5 ML SYRINGE
INTRAMUSCULAR | Status: DC | PRN
  Administered 2016-09-07: 60 mg via INTRAVENOUS

## 2016-09-07 MED ORDER — INSULIN ASPART 100 UNIT/ML ~~LOC~~ SOLN
0.0000 [IU] | SUBCUTANEOUS | Status: DC
  Administered 2016-09-07: 4 [IU] via SUBCUTANEOUS
  Administered 2016-09-08: 8 [IU] via SUBCUTANEOUS
  Administered 2016-09-08 (×2): 2 [IU] via SUBCUTANEOUS
  Administered 2016-09-08: 4 [IU] via SUBCUTANEOUS
  Administered 2016-09-08: 8 [IU] via SUBCUTANEOUS
  Administered 2016-09-09: 2 [IU] via SUBCUTANEOUS

## 2016-09-07 MED ORDER — ACETAMINOPHEN 500 MG PO TABS
1000.0000 mg | ORAL_TABLET | Freq: Four times a day (QID) | ORAL | Status: DC
  Administered 2016-09-07 – 2016-09-12 (×15): 1000 mg via ORAL
  Filled 2016-09-07 (×16): qty 2

## 2016-09-07 MED ORDER — PROPOFOL 10 MG/ML IV BOLUS
INTRAVENOUS | Status: DC | PRN
  Administered 2016-09-07: 150 mg via INTRAVENOUS

## 2016-09-07 MED ORDER — OXYCODONE HCL 5 MG/5ML PO SOLN: 5.0000 mg | Freq: Once | ORAL | Status: DC | PRN

## 2016-09-07 MED ORDER — PHENYLEPHRINE 40 MCG/ML (10ML) SYRINGE FOR IV PUSH (FOR BLOOD PRESSURE SUPPORT)
PREFILLED_SYRINGE | INTRAVENOUS | Status: AC
  Filled 2016-09-07: qty 10

## 2016-09-07 MED ORDER — LIDOCAINE 2% (20 MG/ML) 5 ML SYRINGE
INTRAMUSCULAR | Status: AC
  Filled 2016-09-07: qty 5

## 2016-09-07 MED ORDER — MIDAZOLAM HCL 2 MG/2ML IJ SOLN
INTRAMUSCULAR | Status: DC | PRN
  Administered 2016-09-07: 2 mg via INTRAVENOUS

## 2016-09-07 MED ORDER — ROCURONIUM BROMIDE 10 MG/ML (PF) SYRINGE
PREFILLED_SYRINGE | INTRAVENOUS | Status: AC
  Filled 2016-09-07: qty 10

## 2016-09-07 MED ORDER — LACTATED RINGERS IV SOLN
INTRAVENOUS | Status: DC
  Administered 2016-09-07: 14:00:00 via INTRAVENOUS

## 2016-09-07 MED ORDER — LEVALBUTEROL HCL 0.63 MG/3ML IN NEBU
0.6300 mg | INHALATION_SOLUTION | Freq: Four times a day (QID) | RESPIRATORY_TRACT | Status: DC
  Administered 2016-09-07 – 2016-09-08 (×5): 0.63 mg via RESPIRATORY_TRACT
  Filled 2016-09-07 (×4): qty 3

## 2016-09-07 MED ORDER — LACTATED RINGERS IV SOLN
INTRAVENOUS | Status: DC | PRN
  Administered 2016-09-07 (×2): via INTRAVENOUS

## 2016-09-07 MED ORDER — PHENYLEPHRINE HCL 10 MG/ML IJ SOLN
INTRAVENOUS | Status: DC | PRN
  Administered 2016-09-07: 60 ug/min via INTRAVENOUS

## 2016-09-07 MED ORDER — ACETAMINOPHEN 325 MG PO TABS
650.0000 mg | ORAL_TABLET | Freq: Four times a day (QID) | ORAL | Status: DC | PRN
  Administered 2016-09-07: 650 mg via ORAL
  Filled 2016-09-07: qty 2

## 2016-09-07 MED ORDER — FENTANYL CITRATE (PF) 100 MCG/2ML IJ SOLN
INTRAMUSCULAR | Status: AC
  Filled 2016-09-07: qty 2

## 2016-09-07 MED ORDER — SODIUM CHLORIDE 0.9% FLUSH: 9.0000 mL | INTRAVENOUS | Status: DC | PRN

## 2016-09-07 MED ORDER — KCL IN DEXTROSE-NACL 20-5-0.9 MEQ/L-%-% IV SOLN
INTRAVENOUS | Status: DC
  Administered 2016-09-07 – 2016-09-08 (×2): via INTRAVENOUS
  Administered 2016-09-09: 10 mL/h via INTRAVENOUS
  Filled 2016-09-07 (×5): qty 1000

## 2016-09-07 MED ORDER — FENTANYL CITRATE (PF) 100 MCG/2ML IJ SOLN
25.0000 ug | INTRAMUSCULAR | Status: DC | PRN
  Administered 2016-09-07: 50 ug via INTRAVENOUS

## 2016-09-07 MED ORDER — FENTANYL CITRATE (PF) 100 MCG/2ML IJ SOLN
INTRAMUSCULAR | Status: DC | PRN
  Administered 2016-09-07: 50 ug via INTRAVENOUS
  Administered 2016-09-07: 100 ug via INTRAVENOUS
  Administered 2016-09-07: 50 ug via INTRAVENOUS

## 2016-09-07 MED ORDER — OXYCODONE HCL 5 MG PO TABS: 5.0000 mg | ORAL_TABLET | Freq: Once | ORAL | Status: DC | PRN

## 2016-09-07 MED ORDER — ENOXAPARIN SODIUM 40 MG/0.4ML ~~LOC~~ SOLN
40.0000 mg | Freq: Every day | SUBCUTANEOUS | Status: DC
  Administered 2016-09-08 – 2016-09-11 (×4): 40 mg via SUBCUTANEOUS
  Filled 2016-09-07 (×4): qty 0.4

## 2016-09-07 MED ORDER — NALOXONE HCL 0.4 MG/ML IJ SOLN: 0.4000 mg | INTRAMUSCULAR | Status: DC | PRN

## 2016-09-07 MED ORDER — SUGAMMADEX SODIUM 500 MG/5ML IV SOLN
INTRAVENOUS | Status: DC | PRN
  Administered 2016-09-07: 450 mg via INTRAVENOUS

## 2016-09-07 MED ORDER — 0.9 % SODIUM CHLORIDE (POUR BTL) OPTIME
TOPICAL | Status: DC | PRN
  Administered 2016-09-07: 1000 mL

## 2016-09-07 MED ORDER — PROPOFOL 10 MG/ML IV BOLUS
INTRAVENOUS | Status: AC
  Filled 2016-09-07: qty 20

## 2016-09-07 MED ORDER — BISACODYL 5 MG PO TBEC
10.0000 mg | DELAYED_RELEASE_TABLET | Freq: Every day | ORAL | Status: DC
  Administered 2016-09-08 – 2016-09-11 (×3): 10 mg via ORAL
  Filled 2016-09-07 (×4): qty 2

## 2016-09-07 MED ORDER — FENTANYL 40 MCG/ML IV SOLN
INTRAVENOUS | Status: DC
  Administered 2016-09-07: 18:00:00 via INTRAVENOUS
  Administered 2016-09-07 – 2016-09-08 (×2): 30 ug via INTRAVENOUS
  Administered 2016-09-08: 75 ug via INTRAVENOUS
  Administered 2016-09-08 (×2): 15 ug via INTRAVENOUS
  Administered 2016-09-08 (×2): 0 ug via INTRAVENOUS
  Administered 2016-09-08: 120 ug via INTRAVENOUS
  Administered 2016-09-09: 40 ug via INTRAVENOUS
  Administered 2016-09-09: 0 ug via INTRAVENOUS
  Administered 2016-09-09: 15 ug via INTRAVENOUS
  Administered 2016-09-09: 0 ug via INTRAVENOUS
  Administered 2016-09-10 (×3): 15 ug via INTRAVENOUS
  Administered 2016-09-11: 9.75 ug via INTRAVENOUS
  Administered 2016-09-11: 15 ug via INTRAVENOUS

## 2016-09-07 MED ORDER — DIPHENHYDRAMINE HCL 50 MG/ML IJ SOLN: 12.5000 mg | Freq: Four times a day (QID) | INTRAMUSCULAR | Status: DC | PRN

## 2016-09-07 MED ORDER — ACETAMINOPHEN 160 MG/5ML PO SOLN
1000.0000 mg | Freq: Four times a day (QID) | ORAL | Status: DC
  Filled 2016-09-07: qty 40.6

## 2016-09-07 MED ORDER — ONDANSETRON HCL 4 MG/2ML IJ SOLN
INTRAMUSCULAR | Status: AC
  Filled 2016-09-07: qty 2

## 2016-09-07 MED ORDER — ONDANSETRON HCL 4 MG/2ML IJ SOLN: 4.0000 mg | Freq: Once | INTRAMUSCULAR | Status: DC | PRN

## 2016-09-07 MED ORDER — FENTANYL 40 MCG/ML IV SOLN
INTRAVENOUS | Status: AC
  Filled 2016-09-07: qty 25

## 2016-09-07 MED ORDER — SODIUM CHLORIDE 0.9 % IV SOLN: Freq: Once | INTRAVENOUS | Status: DC

## 2016-09-07 MED ORDER — DEXAMETHASONE SODIUM PHOSPHATE 10 MG/ML IJ SOLN
INTRAMUSCULAR | Status: DC | PRN
  Administered 2016-09-07: 10 mg via INTRAVENOUS

## 2016-09-07 SURGICAL SUPPLY — 68 items
APPLICATOR TIP COSEAL (VASCULAR PRODUCTS) IMPLANT
APPLICATOR TIP EXT COSEAL (VASCULAR PRODUCTS) IMPLANT
CANISTER SUCTION 2500CC (MISCELLANEOUS) ×3 IMPLANT
CATH KIT ON Q 5IN SLV (PAIN MANAGEMENT) IMPLANT
CATH THORACIC 28FR (CATHETERS) IMPLANT
CATH THORACIC 36FR (CATHETERS) ×9 IMPLANT
CATH THORACIC 36FR RT ANG (CATHETERS) IMPLANT
CLEANER TIP ELECTROSURG 2X2 (MISCELLANEOUS) ×3 IMPLANT
CLIP TI MEDIUM 6 (CLIP) IMPLANT
CONN Y 3/8X3/8X3/8  BEN (MISCELLANEOUS) ×4
CONN Y 3/8X3/8X3/8 BEN (MISCELLANEOUS) ×2 IMPLANT
CONT SPEC 4OZ CLIKSEAL STRL BL (MISCELLANEOUS) ×6 IMPLANT
DERMABOND ADVANCED (GAUZE/BANDAGES/DRESSINGS) ×2
DERMABOND ADVANCED .7 DNX12 (GAUZE/BANDAGES/DRESSINGS) ×1 IMPLANT
DRAPE LAPAROSCOPIC ABDOMINAL (DRAPES) ×3 IMPLANT
DRAPE WARM FLUID 44X44 (DRAPE) ×3 IMPLANT
DRSG COVADERM 4X10 (GAUZE/BANDAGES/DRESSINGS) ×3 IMPLANT
ELECT BLADE 6.5 EXT (BLADE) ×3 IMPLANT
ELECT REM PT RETURN 9FT ADLT (ELECTROSURGICAL) ×3
ELECTRODE REM PT RTRN 9FT ADLT (ELECTROSURGICAL) ×1 IMPLANT
GAUZE SPONGE 4X4 12PLY STRL (GAUZE/BANDAGES/DRESSINGS) ×3 IMPLANT
GLOVE BIO SURGEON STRL SZ 6.5 (GLOVE) ×4 IMPLANT
GLOVE BIO SURGEONS STRL SZ 6.5 (GLOVE) ×2
GLOVE EUDERMIC 7 POWDERFREE (GLOVE) ×6 IMPLANT
GOWN STRL REUS W/ TWL LRG LVL3 (GOWN DISPOSABLE) ×3 IMPLANT
GOWN STRL REUS W/ TWL XL LVL3 (GOWN DISPOSABLE) ×1 IMPLANT
GOWN STRL REUS W/TWL LRG LVL3 (GOWN DISPOSABLE) ×6
GOWN STRL REUS W/TWL XL LVL3 (GOWN DISPOSABLE) ×2
KIT BASIN OR (CUSTOM PROCEDURE TRAY) ×3 IMPLANT
KIT ROOM TURNOVER OR (KITS) ×3 IMPLANT
KIT SUCTION CATH 14FR (SUCTIONS) ×3 IMPLANT
NS IRRIG 1000ML POUR BTL (IV SOLUTION) ×6 IMPLANT
PACK CHEST (CUSTOM PROCEDURE TRAY) ×3 IMPLANT
PAD ARMBOARD 7.5X6 YLW CONV (MISCELLANEOUS) ×6 IMPLANT
SEALANT SURG COSEAL 4ML (VASCULAR PRODUCTS) IMPLANT
SEALANT SURG COSEAL 8ML (VASCULAR PRODUCTS) IMPLANT
SOLUTION ANTI FOG 6CC (MISCELLANEOUS) ×3 IMPLANT
STAPLER VISISTAT 35W (STAPLE) ×3 IMPLANT
SUT PROLENE 3 0 SH DA (SUTURE) IMPLANT
SUT PROLENE 4 0 RB 1 (SUTURE)
SUT PROLENE 4-0 RB1 .5 CRCL 36 (SUTURE) IMPLANT
SUT SILK  1 MH (SUTURE) ×4
SUT SILK 1 MH (SUTURE) ×2 IMPLANT
SUT SILK 1 TIES 10X30 (SUTURE) IMPLANT
SUT SILK 2 0SH CR/8 30 (SUTURE) IMPLANT
SUT SILK 3 0SH CR/8 30 (SUTURE) IMPLANT
SUT VIC AB 1 CTX 18 (SUTURE) IMPLANT
SUT VIC AB 1 CTX 27 (SUTURE) ×3 IMPLANT
SUT VIC AB 1 CTX 36 (SUTURE)
SUT VIC AB 1 CTX36XBRD ANBCTR (SUTURE) IMPLANT
SUT VIC AB 2-0 CTX 27 (SUTURE) ×3 IMPLANT
SUT VIC AB 2-0 CTX 36 (SUTURE) IMPLANT
SUT VIC AB 2-0 UR6 27 (SUTURE) IMPLANT
SUT VIC AB 3-0 MH 27 (SUTURE) IMPLANT
SUT VIC AB 3-0 X1 27 (SUTURE) ×3 IMPLANT
SUT VICRYL 2 TP 1 (SUTURE) ×3 IMPLANT
SWAB COLLECTION DEVICE MRSA (MISCELLANEOUS) ×3 IMPLANT
SWAB CULTURE ESWAB REG 1ML (MISCELLANEOUS) ×3 IMPLANT
SYSTEM SAHARA CHEST DRAIN ATS (WOUND CARE) ×3 IMPLANT
TAPE CLOTH 4X10 WHT NS (GAUZE/BANDAGES/DRESSINGS) ×3 IMPLANT
TAPE CLOTH SURG 4X10 WHT LF (GAUZE/BANDAGES/DRESSINGS) ×3 IMPLANT
TIP APPLICATOR SPRAY EXTEND 16 (VASCULAR PRODUCTS) IMPLANT
TOWEL OR 17X24 6PK STRL BLUE (TOWEL DISPOSABLE) ×3 IMPLANT
TOWEL OR 17X26 10 PK STRL BLUE (TOWEL DISPOSABLE) ×6 IMPLANT
TRAP SPECIMEN MUCOUS 40CC (MISCELLANEOUS) ×3 IMPLANT
TRAY FOLEY CATH 14FRSI W/METER (CATHETERS) IMPLANT
TUBE ANAEROBIC SPECIMEN COL (MISCELLANEOUS) IMPLANT
WATER STERILE IRR 1000ML POUR (IV SOLUTION) ×6 IMPLANT

## 2016-09-07 NOTE — Progress Notes (Signed)
Patient ID: [name], female   DOB: 1946-05-27, 69 y.o.   MRN: [id number]  SICU Evening Rounds:   Hemodynamically stable   sats 99% on [place] Urine output good  CT output low  CBC    Component Value Date/Time   WBC 19.5 (H) 09/07/2016 1900   RBC 3.64 (L) 09/07/2016 1900   HGB 11.1 (L) 09/07/2016 1900   HCT 34.9 (L) 09/07/2016 1900   PLT 507 (H) 09/07/2016 1900   MCV 95.9 09/07/2016 1900   MCH 30.5 09/07/2016 1900   MCHC 31.8 09/07/2016 1900   RDW 13.1 09/07/2016 1900     BMET    Component Value Date/Time   NA 135 09/07/2016 0553   K 3.6 09/07/2016 0553   CL 98 (L) 09/07/2016 0553   CO2 30 09/07/2016 0553   GLUCOSE 157 (H) 09/07/2016 0553   BUN 13 09/07/2016 0553   CREATININE 0.84 09/07/2016 0553   CALCIUM 8.0 (L) 09/07/2016 0553   GFRNONAA >60 09/07/2016 0553   GFRAA >60 09/07/2016 0553     A/P:  Stable postop course. Continue current plans

## 2016-09-07 NOTE — Progress Notes (Signed)
Subjective: See previous note  Objective: Vital signs in last 24 hours: Vitals:   09/06/16 2137 09/06/16 2250 09/06/16 2355 09/07/16 0541  BP: (!) 129/53   (!) 142/67  Pulse: 98   86  Resp: 20   20  Temp: 98.9 F (37.2 C)   98.2 F (36.8 C)  TempSrc: Oral   Oral  SpO2: 93% 93% 95% 91%  Weight:      Height:       Weight change:   Intake/Output Summary (Last 24 hours) at 09/07/16 0801 Last data filed at 09/06/16 1741  Gross per 24 hour  Intake              490 ml  Output              275 ml  Net              215 ml    Physical Exam: See previous note  Lab Results:    Results for orders placed or performed during the hospital encounter of 09/05/16 (from the past 24 hour(s))  Protein, pleural or peritoneal fluid     Status: None   Collection Time: 09/06/16  2:25 PM  Result Value Ref Range   Total protein, fluid 3.8 g/dL   Fluid Type-FTP Pleural R   Glucose, pleural or peritoneal fluid     Status: None   Collection Time: 09/06/16  2:45 PM  Result Value Ref Range   Glucose, Fluid <20 mg/dL   Fluid Type-FGLU Pleural Fld   Body fluid cell count with differential     Status: Abnormal   Collection Time: 09/06/16  2:45 PM  Result Value Ref Range   Fluid Type-FCT PLEURAL    Color, Fluid BROWN (A) YELLOW   Appearance, Fluid TURBID (A) CLEAR   WBC, Fluid 793,343 (H) 0 - 1,000 cu mm   Neutrophil Count, Fluid 89 (H) 0 - 25 %   Lymphs, Fluid 6 %   Monocyte-Macrophage-Serous Fluid 5 (L) 50 - 90 %   Eos, Fluid 0 %   Other Cells, Fluid 0 %  Culture, body fluid-bottle     Status: None (Preliminary result)   Collection Time: 09/06/16  2:45 PM  Result Value Ref Range   Specimen Description PLEURAL    Special Requests BOTTLES DRAWN AEROBIC AND ANAEROBIC 10CC EACH    Gram Stain      GRAM POSITIVE COCCI Gram Stain Report Called to,Read Back By and Verified With: HERTEAU,L. AT 2344 ON 09/06/2016 BY AGUNDIZ,E. OBTAINED FROM BOTH BOTTLES    Culture NO GROWTH < 24 HOURS    Report  Status PENDING   Gram stain     Status: None   Collection Time: 09/06/16  2:45 PM  Result Value Ref Range   Specimen Description PLEURAL    Special Requests NONE    Gram Stain      GRAM POSITIVE COCCI Gram Stain Report Called to,Read Back By and Verified With: KNIGHT C. AT [id number] ON 09/06/2016 BY EVA   Report Status 09/06/2016 FINAL   Lactate dehydrogenase (CSF, pleural or peritoneal fluid)     Status: Abnormal   Collection Time: 09/06/16  2:47 PM  Result Value Ref Range   LD, Fluid >10,000 (H) 3 - 23 U/L   Fluid Type-FLDH Pleural R   Basic metabolic panel     Status: Abnormal   Collection Time: 09/07/16  5:53 AM  Result Value Ref Range   Sodium 135 135 - 145 mmol/L  Potassium 3.6 3.5 - 5.1 mmol/L   Chloride 98 (L) 101 - 111 mmol/L   CO2 30 22 - 32 mmol/L   Glucose, Bld 157 (H) 65 - 99 mg/dL   BUN 13 6 - 20 mg/dL   Creatinine, Ser 0.84 0.44 - 1.00 mg/dL   Calcium 8.0 (L) 8.9 - 10.3 mg/dL   GFR calc non Af Amer >60 >60 mL/min   GFR calc Af Amer >60 >60 mL/min   Anion gap 7 5 - 15     ABGS No results for input(s): PHART, PO2ART, TCO2, HCO3 in the last 72 hours.  Invalid input(s): PCO2 CULTURES Recent Results (from the past 240 hour(s))  MRSA PCR Screening     Status: None   Collection Time: 09/05/16 10:49 PM  Result Value Ref Range Status   MRSA by PCR NEGATIVE NEGATIVE Final    Comment:        The GeneXpert MRSA Assay (FDA approved for NASAL specimens only), is one component of a comprehensive MRSA colonization surveillance program. It is not intended to diagnose MRSA infection nor to guide or monitor treatment for MRSA infections.   Culture, body fluid-bottle     Status: None (Preliminary result)   Collection Time: 09/06/16  2:45 PM  Result Value Ref Range Status   Specimen Description PLEURAL  Final   Special Requests BOTTLES DRAWN AEROBIC AND ANAEROBIC 10CC EACH  Final   Gram Stain   Final    GRAM POSITIVE COCCI Gram Stain Report Called to,Read Back By and  Verified With: HERTEAU,L. AT 2344 ON 09/06/2016 BY AGUNDIZ,E. OBTAINED FROM BOTH BOTTLES    Culture NO GROWTH < 24 HOURS  Final   Report Status PENDING  Incomplete  Gram stain     Status: None   Collection Time: 09/06/16  2:45 PM  Result Value Ref Range Status   Specimen Description PLEURAL  Final   Special Requests NONE  Final   Gram Stain   Final    GRAM POSITIVE COCCI Gram Stain Report Called to,Read Back By and Verified With: KNIGHT C. AT [id number] ON 09/06/2016 BY EVA   Report Status 09/06/2016 FINAL  Final   Studies/Results: Dg Chest 1 View  Result Date: 09/06/2016 CLINICAL DATA:  Loculated RIGHT pleural effusion post thoracentesis EXAM: CHEST 1 VIEW COMPARISON:  Chest radiograph and chest CT 09/05/2016 FINDINGS: Stable enlarged cardiac silhouette post AVR. Large RIGHT pleural effusion with significant RIGHT lung atelectasis. Azygos fissure noted. LEFT lung demonstrates basilar atelectasis likely related expiratory technique. No pneumothorax following RIGHT thoracentesis. IMPRESSION: No pneumothorax following RIGHT thoracentesis. Electronically Signed   By: Lavonia Dana M.D.   On: 09/06/2016 14:42   Dg Chest 2 View  Result Date: 09/05/2016 CLINICAL DATA:  Increased shortness of breath with right leg swelling. Chills starting at 10 a.m. today. EXAM: CHEST  2 VIEW COMPARISON:  None. FINDINGS: Two views study shows right base collapse/ consolidation with moderate to large right pleural effusion. Soft tissue fullness is noted in the right mediastinum. Left lung appears clear. Cardiopericardial silhouette is at upper limits of normal for size. Patient is status post median sternotomy bones are diffusely demineralized. IMPRESSION: Moderate to large right pleural effusion with right mediastinal fullness in right base collapse/ consolidation. Unilateral disease raises concern for underlying neoplasm. Electronically Signed   By: Misty Stanley M.D.   On: 09/05/2016 17:48   Ct Angio Chest Pe W And/or Wo  Contrast  Result Date: 09/05/2016 CLINICAL DATA:  Chest pain, right neck pain  and abdominal pain since this morning. EXAM: CT ANGIOGRAPHY CHEST CT ABDOMEN AND PELVIS WITH CONTRAST TECHNIQUE: Multidetector CT imaging of the chest was performed using the standard protocol during bolus administration of intravenous contrast. Multiplanar CT image reconstructions and MIPs were obtained to evaluate the vascular anatomy. Multidetector CT imaging of the abdomen and pelvis was performed using the standard protocol during bolus administration of intravenous contrast. CONTRAST:  100 cc Isovue 370 COMPARISON:  None. FINDINGS: CTA CHEST FINDINGS Chest wall: No breast masses, supraclavicular or axillary lymphadenopathy. The thyroid gland appears normal. Cardiovascular: The heart is normal in size for age. No pericardial effusion. There is tortuosity, ectasia and calcification of the thoracic aorta. No focal aneurysm or dissection. The branch vessels are patent. The pulmonary arterial tree is fairly well opacified. No filling defects to suggest pulmonary embolism. Mediastinum/Nodes: Borderline mediastinal lymph nodes. Largest right paratracheal node measures 12 mm on image number 30. Precarinal lymph node on image number 30 measures 7 mm. Aorticopulmonary window lymph node on image number 31 measures 8 mm. Subcarinal lymph node on image number 42 measures 13 mm. No hilar adenopathy. The esophagus is grossly normal. Lungs/Pleura: Large right-sided pleural effusion with probable loculation. No obvious enhancing split pleura sign may suggest empyema. There is significant compressive atelectasis of the right middle lobe and right lower lobe. No enhancing pleural nodules. Thoracentesis may be helpful for diagnostic and therapeutic purposes. No left-sided pleural effusion. The left lung is clear. No worrisome pulmonary nodules. Azygos fissure is noted. Musculoskeletal: No significant bony findings. Review of the MIP images confirms  the above findings. CT ABDOMEN and PELVIS FINDINGS Hepatobiliary: No focal hepatic lesions or intrahepatic biliary dilatation. Gallstones are noted in the gallbladder but no findings for acute cholecystitis. Normal caliber common bile duct. Pancreas: No mass, inflammation or ductal dilatation. Spleen: Normal size.  No focal lesions. Adrenals/Urinary Tract: The adrenal glands and kidneys are unremarkable. The bladder is normal. Stomach/Bowel: The stomach, duodenum, small bowel and colon are unremarkable. No inflammatory changes, mass lesions or obstructive findings. The terminal ileum is normal. The appendix is normal. Vascular/Lymphatic: No mesenteric or retroperitoneal mass or adenopathy. Moderate atherosclerotic calcifications involving the aorta. The branch vessels patent. The major venous structures are patent. Reproductive: The endometrium is prominent and somewhat irregular for the patient's age. Could not exclude endometrial hyperplasia or neoplasm. Recommend endometrial sampling. The ovaries are normal. Other: No pelvic adenopathy. No free pelvic fluid collections. No inguinal mass or adenopathy. No abdominal wall hernia or subcutaneous lesions. Musculoskeletal:   No significant bony findings. Review of the MIP images confirms the above findings. IMPRESSION: 1. No CT findings for aortic dissection or aneurysm. 2. No CT findings for pulmonary embolism. 3. Large complex right pleural effusion, likely loculated. No obvious enhancing pleural nodules or masses. There is significant compressive atelectasis of the right middle lobe and right lower lobe. Thoracentesis may be helpful for diagnostic and therapeutic purposes. 4. No worrisome lung lesions or endobronchial mass. 5. No acute findings in the abdomen/pelvis. 6. Thickened and somewhat irregular endometrium. Recommend GYN consultation and endometrial sampling. Electronically Signed   By: Marijo Sanes M.D.   On: 09/05/2016 20:30   Ct Abdomen Pelvis W  Contrast  Result Date: 09/05/2016 CLINICAL DATA:  Chest pain, right neck pain and abdominal pain since this morning. EXAM: CT ANGIOGRAPHY CHEST CT ABDOMEN AND PELVIS WITH CONTRAST TECHNIQUE: Multidetector CT imaging of the chest was performed using the standard protocol during bolus administration of intravenous contrast. Multiplanar CT image reconstructions  and MIPs were obtained to evaluate the vascular anatomy. Multidetector CT imaging of the abdomen and pelvis was performed using the standard protocol during bolus administration of intravenous contrast. CONTRAST:  100 cc Isovue 370 COMPARISON:  None. FINDINGS: CTA CHEST FINDINGS Chest wall: No breast masses, supraclavicular or axillary lymphadenopathy. The thyroid gland appears normal. Cardiovascular: The heart is normal in size for age. No pericardial effusion. There is tortuosity, ectasia and calcification of the thoracic aorta. No focal aneurysm or dissection. The branch vessels are patent. The pulmonary arterial tree is fairly well opacified. No filling defects to suggest pulmonary embolism. Mediastinum/Nodes: Borderline mediastinal lymph nodes. Largest right paratracheal node measures 12 mm on image number 30. Precarinal lymph node on image number 30 measures 7 mm. Aorticopulmonary window lymph node on image number 31 measures 8 mm. Subcarinal lymph node on image number 42 measures 13 mm. No hilar adenopathy. The esophagus is grossly normal. Lungs/Pleura: Large right-sided pleural effusion with probable loculation. No obvious enhancing split pleura sign may suggest empyema. There is significant compressive atelectasis of the right middle lobe and right lower lobe. No enhancing pleural nodules. Thoracentesis may be helpful for diagnostic and therapeutic purposes. No left-sided pleural effusion. The left lung is clear. No worrisome pulmonary nodules. Azygos fissure is noted. Musculoskeletal: No significant bony findings. Review of the MIP images confirms  the above findings. CT ABDOMEN and PELVIS FINDINGS Hepatobiliary: No focal hepatic lesions or intrahepatic biliary dilatation. Gallstones are noted in the gallbladder but no findings for acute cholecystitis. Normal caliber common bile duct. Pancreas: No mass, inflammation or ductal dilatation. Spleen: Normal size.  No focal lesions. Adrenals/Urinary Tract: The adrenal glands and kidneys are unremarkable. The bladder is normal. Stomach/Bowel: The stomach, duodenum, small bowel and colon are unremarkable. No inflammatory changes, mass lesions or obstructive findings. The terminal ileum is normal. The appendix is normal. Vascular/Lymphatic: No mesenteric or retroperitoneal mass or adenopathy. Moderate atherosclerotic calcifications involving the aorta. The branch vessels patent. The major venous structures are patent. Reproductive: The endometrium is prominent and somewhat irregular for the patient's age. Could not exclude endometrial hyperplasia or neoplasm. Recommend endometrial sampling. The ovaries are normal. Other: No pelvic adenopathy. No free pelvic fluid collections. No inguinal mass or adenopathy. No abdominal wall hernia or subcutaneous lesions. Musculoskeletal:   No significant bony findings. Review of the MIP images confirms the above findings. IMPRESSION: 1. No CT findings for aortic dissection or aneurysm. 2. No CT findings for pulmonary embolism. 3. Large complex right pleural effusion, likely loculated. No obvious enhancing pleural nodules or masses. There is significant compressive atelectasis of the right middle lobe and right lower lobe. Thoracentesis may be helpful for diagnostic and therapeutic purposes. 4. No worrisome lung lesions or endobronchial mass. 5. No acute findings in the abdomen/pelvis. 6. Thickened and somewhat irregular endometrium. Recommend GYN consultation and endometrial sampling. Electronically Signed   By: Marijo Sanes M.D.   On: 09/05/2016 20:30   US Thoracentesis Asp  Pleural Space W/img Guide  Result Date: 09/06/2016 INDICATION: Loculated RIGHT pleural effusion EXAM: ULTRASOUND GUIDED DIAGNOSTIC RIGHT THORACENTESIS MEDICATIONS: None. COMPLICATIONS: None immediate. PROCEDURE: Procedure, benefits, and risks of procedure were discussed with patient. Written informed consent for procedure was obtained. Time out protocol followed. Pleural effusion localized by ultrasound at the posterior RIGHT hemithorax. Skin prepped and draped in usual sterile fashion. Skin and soft tissues anesthetized with 10 mL of 1% lidocaine. 8 French thoracentesis catheter placed into the RIGHT pleural space. 60 mL of grossly purulent fluid aspirated  by syringe pump. Procedure tolerated well by patient without immediate complication. FINDINGS: A total of approximately 60 ml of purulent RIGHT pleural fluid was removed. Fluid was sent to the laboratory as requested by the clinical team. IMPRESSION: Successful ultrasound guided RIGHT thoracentesis yielding 60 cc of purulent RIGHT pleural fluid. Electronically Signed   By: Lavonia Dana M.D.   On: 09/06/2016 14:46   Micro Results: Recent Results (from the past 240 hour(s))  MRSA PCR Screening     Status: None   Collection Time: 09/05/16 10:49 PM  Result Value Ref Range Status   MRSA by PCR NEGATIVE NEGATIVE Final    Comment:        The GeneXpert MRSA Assay (FDA approved for NASAL specimens only), is one component of a comprehensive MRSA colonization surveillance program. It is not intended to diagnose MRSA infection nor to guide or monitor treatment for MRSA infections.   Culture, body fluid-bottle     Status: None (Preliminary result)   Collection Time: 09/06/16  2:45 PM  Result Value Ref Range Status   Specimen Description PLEURAL  Final   Special Requests BOTTLES DRAWN AEROBIC AND ANAEROBIC 10CC EACH  Final   Gram Stain   Final    GRAM POSITIVE COCCI Gram Stain Report Called to,Read Back By and Verified With: HERTEAU,L. AT 2344 ON  09/06/2016 BY AGUNDIZ,E. OBTAINED FROM BOTH BOTTLES    Culture NO GROWTH < 24 HOURS  Final   Report Status PENDING  Incomplete  Gram stain     Status: None   Collection Time: 09/06/16  2:45 PM  Result Value Ref Range Status   Specimen Description PLEURAL  Final   Special Requests NONE  Final   Gram Stain   Final    GRAM POSITIVE COCCI Gram Stain Report Called to,Read Back By and Verified With: KNIGHT C. AT [id number] ON 09/06/2016 BY EVA   Report Status 09/06/2016 FINAL  Final   Studies/Results: Dg Chest 1 View  Result Date: 09/06/2016 CLINICAL DATA:  Loculated RIGHT pleural effusion post thoracentesis EXAM: CHEST 1 VIEW COMPARISON:  Chest radiograph and chest CT 09/05/2016 FINDINGS: Stable enlarged cardiac silhouette post AVR. Large RIGHT pleural effusion with significant RIGHT lung atelectasis. Azygos fissure noted. LEFT lung demonstrates basilar atelectasis likely related expiratory technique. No pneumothorax following RIGHT thoracentesis. IMPRESSION: No pneumothorax following RIGHT thoracentesis. Electronically Signed   By: Lavonia Dana M.D.   On: 09/06/2016 14:42   Dg Chest 2 View  Result Date: 09/05/2016 CLINICAL DATA:  Increased shortness of breath with right leg swelling. Chills starting at 10 a.m. today. EXAM: CHEST  2 VIEW COMPARISON:  None. FINDINGS: Two views study shows right base collapse/ consolidation with moderate to large right pleural effusion. Soft tissue fullness is noted in the right mediastinum. Left lung appears clear. Cardiopericardial silhouette is at upper limits of normal for size. Patient is status post median sternotomy bones are diffusely demineralized. IMPRESSION: Moderate to large right pleural effusion with right mediastinal fullness in right base collapse/ consolidation. Unilateral disease raises concern for underlying neoplasm. Electronically Signed   By: Misty Stanley M.D.   On: 09/05/2016 17:48   Ct Angio Chest Pe W And/or Wo Contrast  Result Date:  09/05/2016 CLINICAL DATA:  Chest pain, right neck pain and abdominal pain since this morning. EXAM: CT ANGIOGRAPHY CHEST CT ABDOMEN AND PELVIS WITH CONTRAST TECHNIQUE: Multidetector CT imaging of the chest was performed using the standard protocol during bolus administration of intravenous contrast. Multiplanar CT image reconstructions and  MIPs were obtained to evaluate the vascular anatomy. Multidetector CT imaging of the abdomen and pelvis was performed using the standard protocol during bolus administration of intravenous contrast. CONTRAST:  100 cc Isovue 370 COMPARISON:  None. FINDINGS: CTA CHEST FINDINGS Chest wall: No breast masses, supraclavicular or axillary lymphadenopathy. The thyroid gland appears normal. Cardiovascular: The heart is normal in size for age. No pericardial effusion. There is tortuosity, ectasia and calcification of the thoracic aorta. No focal aneurysm or dissection. The branch vessels are patent. The pulmonary arterial tree is fairly well opacified. No filling defects to suggest pulmonary embolism. Mediastinum/Nodes: Borderline mediastinal lymph nodes. Largest right paratracheal node measures 12 mm on image number 30. Precarinal lymph node on image number 30 measures 7 mm. Aorticopulmonary window lymph node on image number 31 measures 8 mm. Subcarinal lymph node on image number 42 measures 13 mm. No hilar adenopathy. The esophagus is grossly normal. Lungs/Pleura: Large right-sided pleural effusion with probable loculation. No obvious enhancing split pleura sign may suggest empyema. There is significant compressive atelectasis of the right middle lobe and right lower lobe. No enhancing pleural nodules. Thoracentesis may be helpful for diagnostic and therapeutic purposes. No left-sided pleural effusion. The left lung is clear. No worrisome pulmonary nodules. Azygos fissure is noted. Musculoskeletal: No significant bony findings. Review of the MIP images confirms the above findings. CT  ABDOMEN and PELVIS FINDINGS Hepatobiliary: No focal hepatic lesions or intrahepatic biliary dilatation. Gallstones are noted in the gallbladder but no findings for acute cholecystitis. Normal caliber common bile duct. Pancreas: No mass, inflammation or ductal dilatation. Spleen: Normal size.  No focal lesions. Adrenals/Urinary Tract: The adrenal glands and kidneys are unremarkable. The bladder is normal. Stomach/Bowel: The stomach, duodenum, small bowel and colon are unremarkable. No inflammatory changes, mass lesions or obstructive findings. The terminal ileum is normal. The appendix is normal. Vascular/Lymphatic: No mesenteric or retroperitoneal mass or adenopathy. Moderate atherosclerotic calcifications involving the aorta. The branch vessels patent. The major venous structures are patent. Reproductive: The endometrium is prominent and somewhat irregular for the patient's age. Could not exclude endometrial hyperplasia or neoplasm. Recommend endometrial sampling. The ovaries are normal. Other: No pelvic adenopathy. No free pelvic fluid collections. No inguinal mass or adenopathy. No abdominal wall hernia or subcutaneous lesions. Musculoskeletal:   No significant bony findings. Review of the MIP images confirms the above findings. IMPRESSION: 1. No CT findings for aortic dissection or aneurysm. 2. No CT findings for pulmonary embolism. 3. Large complex right pleural effusion, likely loculated. No obvious enhancing pleural nodules or masses. There is significant compressive atelectasis of the right middle lobe and right lower lobe. Thoracentesis may be helpful for diagnostic and therapeutic purposes. 4. No worrisome lung lesions or endobronchial mass. 5. No acute findings in the abdomen/pelvis. 6. Thickened and somewhat irregular endometrium. Recommend GYN consultation and endometrial sampling. Electronically Signed   By: Marijo Sanes M.D.   On: 09/05/2016 20:30   Ct Abdomen Pelvis W Contrast  Result Date:  09/05/2016 CLINICAL DATA:  Chest pain, right neck pain and abdominal pain since this morning. EXAM: CT ANGIOGRAPHY CHEST CT ABDOMEN AND PELVIS WITH CONTRAST TECHNIQUE: Multidetector CT imaging of the chest was performed using the standard protocol during bolus administration of intravenous contrast. Multiplanar CT image reconstructions and MIPs were obtained to evaluate the vascular anatomy. Multidetector CT imaging of the abdomen and pelvis was performed using the standard protocol during bolus administration of intravenous contrast. CONTRAST:  100 cc Isovue 370 COMPARISON:  None. FINDINGS: CTA  CHEST FINDINGS Chest wall: No breast masses, supraclavicular or axillary lymphadenopathy. The thyroid gland appears normal. Cardiovascular: The heart is normal in size for age. No pericardial effusion. There is tortuosity, ectasia and calcification of the thoracic aorta. No focal aneurysm or dissection. The branch vessels are patent. The pulmonary arterial tree is fairly well opacified. No filling defects to suggest pulmonary embolism. Mediastinum/Nodes: Borderline mediastinal lymph nodes. Largest right paratracheal node measures 12 mm on image number 30. Precarinal lymph node on image number 30 measures 7 mm. Aorticopulmonary window lymph node on image number 31 measures 8 mm. Subcarinal lymph node on image number 42 measures 13 mm. No hilar adenopathy. The esophagus is grossly normal. Lungs/Pleura: Large right-sided pleural effusion with probable loculation. No obvious enhancing split pleura sign may suggest empyema. There is significant compressive atelectasis of the right middle lobe and right lower lobe. No enhancing pleural nodules. Thoracentesis may be helpful for diagnostic and therapeutic purposes. No left-sided pleural effusion. The left lung is clear. No worrisome pulmonary nodules. Azygos fissure is noted. Musculoskeletal: No significant bony findings. Review of the MIP images confirms the above findings. CT  ABDOMEN and PELVIS FINDINGS Hepatobiliary: No focal hepatic lesions or intrahepatic biliary dilatation. Gallstones are noted in the gallbladder but no findings for acute cholecystitis. Normal caliber common bile duct. Pancreas: No mass, inflammation or ductal dilatation. Spleen: Normal size.  No focal lesions. Adrenals/Urinary Tract: The adrenal glands and kidneys are unremarkable. The bladder is normal. Stomach/Bowel: The stomach, duodenum, small bowel and colon are unremarkable. No inflammatory changes, mass lesions or obstructive findings. The terminal ileum is normal. The appendix is normal. Vascular/Lymphatic: No mesenteric or retroperitoneal mass or adenopathy. Moderate atherosclerotic calcifications involving the aorta. The branch vessels patent. The major venous structures are patent. Reproductive: The endometrium is prominent and somewhat irregular for the patient's age. Could not exclude endometrial hyperplasia or neoplasm. Recommend endometrial sampling. The ovaries are normal. Other: No pelvic adenopathy. No free pelvic fluid collections. No inguinal mass or adenopathy. No abdominal wall hernia or subcutaneous lesions. Musculoskeletal:   No significant bony findings. Review of the MIP images confirms the above findings. IMPRESSION: 1. No CT findings for aortic dissection or aneurysm. 2. No CT findings for pulmonary embolism. 3. Large complex right pleural effusion, likely loculated. No obvious enhancing pleural nodules or masses. There is significant compressive atelectasis of the right middle lobe and right lower lobe. Thoracentesis may be helpful for diagnostic and therapeutic purposes. 4. No worrisome lung lesions or endobronchial mass. 5. No acute findings in the abdomen/pelvis. 6. Thickened and somewhat irregular endometrium. Recommend GYN consultation and endometrial sampling. Electronically Signed   By: Marijo Sanes M.D.   On: 09/05/2016 20:30   US Thoracentesis Asp Pleural Space W/img  Guide  Result Date: 09/06/2016 INDICATION: Loculated RIGHT pleural effusion EXAM: ULTRASOUND GUIDED DIAGNOSTIC RIGHT THORACENTESIS MEDICATIONS: None. COMPLICATIONS: None immediate. PROCEDURE: Procedure, benefits, and risks of procedure were discussed with patient. Written informed consent for procedure was obtained. Time out protocol followed. Pleural effusion localized by ultrasound at the posterior RIGHT hemithorax. Skin prepped and draped in usual sterile fashion. Skin and soft tissues anesthetized with 10 mL of 1% lidocaine. 8 French thoracentesis catheter placed into the RIGHT pleural space. 60 mL of grossly purulent fluid aspirated by syringe pump. Procedure tolerated well by patient without immediate complication. FINDINGS: A total of approximately 60 ml of purulent RIGHT pleural fluid was removed. Fluid was sent to the laboratory as requested by the clinical team. IMPRESSION: Successful ultrasound  guided RIGHT thoracentesis yielding 60 cc of purulent RIGHT pleural fluid. Electronically Signed   By: Lavonia Dana M.D.   On: 09/06/2016 14:46   Medications:  I have reviewed the patient's current medications Scheduled Meds: . donepezil  10 mg Oral QHS  . enoxaparin (LOVENOX) injection  40 mg Subcutaneous Q24H  . furosemide  20 mg Oral Daily  . metoprolol succinate  25 mg Oral Daily  . piperacillin-tazobactam (ZOSYN)  IV  3.375 g Intravenous Q8H  . potassium chloride  20 mEq Oral TID  . rosuvastatin  5 mg Oral Daily  . vancomycin  750 mg Intravenous Q12H   Continuous Infusions: . sodium chloride 10 mL/hr (09/05/16 2329)   PRN Meds:.acetaminophen, ondansetron **OR** ondansetron (ZOFRAN) IV   Assessment/Plan: Case discussed with Dr. Cyndia Bent.  Will transfer to [facility] to thoracic surgery for additional treatment. Condition at discharge is stable. Active Problems:   MCI (mild cognitive impairment) with memory loss   Pleural effusion   Pleural effusion on right     LOS: 2 days    Thecla Forgione 09/07/2016, 8:01 AM

## 2016-09-07 NOTE — Progress Notes (Signed)
She had a white blood cell count of over 700,000 on her thoracentesis fluid. She is appropriately at being transferred to [facility] for cardiothoracic consultation and probable VATS procedure

## 2016-09-07 NOTE — Progress Notes (Signed)
CRITICAL VALUE ALERT  Critical value received:  Pleural fluid- gram positive cocci   Both bottles      Date of notification:  09/06/16  Time of notification:  2340  Critical value read back:Yes.    Nurse who received alert:  Marny Lowenstein  MD notified (1st page):  [email or web address]   Time of first page:  0200  No new orders

## 2016-09-07 NOTE — Progress Notes (Signed)
Subjective: She denies any shortness or breath this morning. She has had no fever overnight.  Objective: Vital signs in last 24 hours: Vitals:   09/06/16 2137 09/06/16 2250 09/06/16 2355 09/07/16 0541  BP: (!) 129/53   (!) 142/67  Pulse: 98   86  Resp: 20   20  Temp: 98.9 F (37.2 C)   98.2 F (36.8 C)  TempSrc: Oral   Oral  SpO2: 93% 93% 95% 91%  Weight:      Height:       Weight change:   Intake/Output Summary (Last 24 hours) at 09/07/16 0715 Last data filed at 09/06/16 1741  Gross per 24 hour  Intake              490 ml  Output              275 ml  Net              215 ml    Physical Exam: Alert. Breathing comfortably. Lungs clear with dull breath sounds in the right base. Heart regular with no murmurs. Abdomen soft and nontender. Extremities reveal trace to 1+ edema in the lower legs.  Lab Results:    Results for orders placed or performed during the hospital encounter of 09/05/16 (from the past 24 hour(s))  Protein, pleural or peritoneal fluid     Status: None   Collection Time: 09/06/16  2:25 PM  Result Value Ref Range   Total protein, fluid 3.8 g/dL   Fluid Type-FTP Pleural R   Glucose, pleural or peritoneal fluid     Status: None   Collection Time: 09/06/16  2:45 PM  Result Value Ref Range   Glucose, Fluid <20 mg/dL   Fluid Type-FGLU Pleural Fld   Body fluid cell count with differential     Status: Abnormal   Collection Time: 09/06/16  2:45 PM  Result Value Ref Range   Fluid Type-FCT PLEURAL    Color, Fluid BROWN (A) YELLOW   Appearance, Fluid TURBID (A) CLEAR   WBC, Fluid 793,343 (H) 0 - 1,000 cu mm   Neutrophil Count, Fluid 89 (H) 0 - 25 %   Lymphs, Fluid 6 %   Monocyte-Macrophage-Serous Fluid 5 (L) 50 - 90 %   Eos, Fluid 0 %   Other Cells, Fluid 0 %  Culture, body fluid-bottle     Status: None (Preliminary result)   Collection Time: 09/06/16  2:45 PM  Result Value Ref Range   Specimen Description PLEURAL    Special Requests BOTTLES DRAWN AEROBIC  AND ANAEROBIC 10CC EACH    Gram Stain      GRAM POSITIVE COCCI Gram Stain Report Called to,Read Back By and Verified With: HERTEAU,L. AT 2344 ON 09/06/2016 BY AGUNDIZ,E. OBTAINED FROM BOTH BOTTLES    Culture NO GROWTH < 24 HOURS    Report Status PENDING   Gram stain     Status: None   Collection Time: 09/06/16  2:45 PM  Result Value Ref Range   Specimen Description PLEURAL    Special Requests NONE    Gram Stain      GRAM POSITIVE COCCI Gram Stain Report Called to,Read Back By and Verified With: KNIGHT C. AT [id number] ON 09/06/2016 BY EVA   Report Status 09/06/2016 FINAL   Lactate dehydrogenase (CSF, pleural or peritoneal fluid)     Status: Abnormal   Collection Time: 09/06/16  2:47 PM  Result Value Ref Range   LD, Fluid >10,000 (H) 3 - 23  U/L   Fluid Type-FLDH Pleural R      ABGS No results for input(s): PHART, PO2ART, TCO2, HCO3 in the last 72 hours.  Invalid input(s): PCO2 CULTURES Recent Results (from the past 240 hour(s))  MRSA PCR Screening     Status: None   Collection Time: 09/05/16 10:49 PM  Result Value Ref Range Status   MRSA by PCR NEGATIVE NEGATIVE Final    Comment:        The GeneXpert MRSA Assay (FDA approved for NASAL specimens only), is one component of a comprehensive MRSA colonization surveillance program. It is not intended to diagnose MRSA infection nor to guide or monitor treatment for MRSA infections.   Culture, body fluid-bottle     Status: None (Preliminary result)   Collection Time: 09/06/16  2:45 PM  Result Value Ref Range Status   Specimen Description PLEURAL  Final   Special Requests BOTTLES DRAWN AEROBIC AND ANAEROBIC 10CC EACH  Final   Gram Stain   Final    GRAM POSITIVE COCCI Gram Stain Report Called to,Read Back By and Verified With: HERTEAU,L. AT 2344 ON 09/06/2016 BY AGUNDIZ,E. OBTAINED FROM BOTH BOTTLES    Culture NO GROWTH < 24 HOURS  Final   Report Status PENDING  Incomplete  Gram stain     Status: None   Collection Time:  09/06/16  2:45 PM  Result Value Ref Range Status   Specimen Description PLEURAL  Final   Special Requests NONE  Final   Gram Stain   Final    GRAM POSITIVE COCCI Gram Stain Report Called to,Read Back By and Verified With: KNIGHT C. AT [id number] ON 09/06/2016 BY EVA   Report Status 09/06/2016 FINAL  Final   Studies/Results: Dg Chest 1 View  Result Date: 09/06/2016 CLINICAL DATA:  Loculated RIGHT pleural effusion post thoracentesis EXAM: CHEST 1 VIEW COMPARISON:  Chest radiograph and chest CT 09/05/2016 FINDINGS: Stable enlarged cardiac silhouette post AVR. Large RIGHT pleural effusion with significant RIGHT lung atelectasis. Azygos fissure noted. LEFT lung demonstrates basilar atelectasis likely related expiratory technique. No pneumothorax following RIGHT thoracentesis. IMPRESSION: No pneumothorax following RIGHT thoracentesis. Electronically Signed   By: Lavonia Dana M.D.   On: 09/06/2016 14:42   Dg Chest 2 View  Result Date: 09/05/2016 CLINICAL DATA:  Increased shortness of breath with right leg swelling. Chills starting at 10 a.m. today. EXAM: CHEST  2 VIEW COMPARISON:  None. FINDINGS: Two views study shows right base collapse/ consolidation with moderate to large right pleural effusion. Soft tissue fullness is noted in the right mediastinum. Left lung appears clear. Cardiopericardial silhouette is at upper limits of normal for size. Patient is status post median sternotomy bones are diffusely demineralized. IMPRESSION: Moderate to large right pleural effusion with right mediastinal fullness in right base collapse/ consolidation. Unilateral disease raises concern for underlying neoplasm. Electronically Signed   By: Misty Stanley M.D.   On: 09/05/2016 17:48   Ct Angio Chest Pe W And/or Wo Contrast  Result Date: 09/05/2016 CLINICAL DATA:  Chest pain, right neck pain and abdominal pain since this morning. EXAM: CT ANGIOGRAPHY CHEST CT ABDOMEN AND PELVIS WITH CONTRAST TECHNIQUE: Multidetector CT imaging  of the chest was performed using the standard protocol during bolus administration of intravenous contrast. Multiplanar CT image reconstructions and MIPs were obtained to evaluate the vascular anatomy. Multidetector CT imaging of the abdomen and pelvis was performed using the standard protocol during bolus administration of intravenous contrast. CONTRAST:  100 cc Isovue 370 COMPARISON:  None. FINDINGS:  CTA CHEST FINDINGS Chest wall: No breast masses, supraclavicular or axillary lymphadenopathy. The thyroid gland appears normal. Cardiovascular: The heart is normal in size for age. No pericardial effusion. There is tortuosity, ectasia and calcification of the thoracic aorta. No focal aneurysm or dissection. The branch vessels are patent. The pulmonary arterial tree is fairly well opacified. No filling defects to suggest pulmonary embolism. Mediastinum/Nodes: Borderline mediastinal lymph nodes. Largest right paratracheal node measures 12 mm on image number 30. Precarinal lymph node on image number 30 measures 7 mm. Aorticopulmonary window lymph node on image number 31 measures 8 mm. Subcarinal lymph node on image number 42 measures 13 mm. No hilar adenopathy. The esophagus is grossly normal. Lungs/Pleura: Large right-sided pleural effusion with probable loculation. No obvious enhancing split pleura sign may suggest empyema. There is significant compressive atelectasis of the right middle lobe and right lower lobe. No enhancing pleural nodules. Thoracentesis may be helpful for diagnostic and therapeutic purposes. No left-sided pleural effusion. The left lung is clear. No worrisome pulmonary nodules. Azygos fissure is noted. Musculoskeletal: No significant bony findings. Review of the MIP images confirms the above findings. CT ABDOMEN and PELVIS FINDINGS Hepatobiliary: No focal hepatic lesions or intrahepatic biliary dilatation. Gallstones are noted in the gallbladder but no findings for acute cholecystitis. Normal  caliber common bile duct. Pancreas: No mass, inflammation or ductal dilatation. Spleen: Normal size.  No focal lesions. Adrenals/Urinary Tract: The adrenal glands and kidneys are unremarkable. The bladder is normal. Stomach/Bowel: The stomach, duodenum, small bowel and colon are unremarkable. No inflammatory changes, mass lesions or obstructive findings. The terminal ileum is normal. The appendix is normal. Vascular/Lymphatic: No mesenteric or retroperitoneal mass or adenopathy. Moderate atherosclerotic calcifications involving the aorta. The branch vessels patent. The major venous structures are patent. Reproductive: The endometrium is prominent and somewhat irregular for the patient's age. Could not exclude endometrial hyperplasia or neoplasm. Recommend endometrial sampling. The ovaries are normal. Other: No pelvic adenopathy. No free pelvic fluid collections. No inguinal mass or adenopathy. No abdominal wall hernia or subcutaneous lesions. Musculoskeletal:   No significant bony findings. Review of the MIP images confirms the above findings. IMPRESSION: 1. No CT findings for aortic dissection or aneurysm. 2. No CT findings for pulmonary embolism. 3. Large complex right pleural effusion, likely loculated. No obvious enhancing pleural nodules or masses. There is significant compressive atelectasis of the right middle lobe and right lower lobe. Thoracentesis may be helpful for diagnostic and therapeutic purposes. 4. No worrisome lung lesions or endobronchial mass. 5. No acute findings in the abdomen/pelvis. 6. Thickened and somewhat irregular endometrium. Recommend GYN consultation and endometrial sampling. Electronically Signed   By: Marijo Sanes M.D.   On: 09/05/2016 20:30   Ct Abdomen Pelvis W Contrast  Result Date: 09/05/2016 CLINICAL DATA:  Chest pain, right neck pain and abdominal pain since this morning. EXAM: CT ANGIOGRAPHY CHEST CT ABDOMEN AND PELVIS WITH CONTRAST TECHNIQUE: Multidetector CT imaging of  the chest was performed using the standard protocol during bolus administration of intravenous contrast. Multiplanar CT image reconstructions and MIPs were obtained to evaluate the vascular anatomy. Multidetector CT imaging of the abdomen and pelvis was performed using the standard protocol during bolus administration of intravenous contrast. CONTRAST:  100 cc Isovue 370 COMPARISON:  None. FINDINGS: CTA CHEST FINDINGS Chest wall: No breast masses, supraclavicular or axillary lymphadenopathy. The thyroid gland appears normal. Cardiovascular: The heart is normal in size for age. No pericardial effusion. There is tortuosity, ectasia and calcification of the thoracic aorta.  No focal aneurysm or dissection. The branch vessels are patent. The pulmonary arterial tree is fairly well opacified. No filling defects to suggest pulmonary embolism. Mediastinum/Nodes: Borderline mediastinal lymph nodes. Largest right paratracheal node measures 12 mm on image number 30. Precarinal lymph node on image number 30 measures 7 mm. Aorticopulmonary window lymph node on image number 31 measures 8 mm. Subcarinal lymph node on image number 42 measures 13 mm. No hilar adenopathy. The esophagus is grossly normal. Lungs/Pleura: Large right-sided pleural effusion with probable loculation. No obvious enhancing split pleura sign may suggest empyema. There is significant compressive atelectasis of the right middle lobe and right lower lobe. No enhancing pleural nodules. Thoracentesis may be helpful for diagnostic and therapeutic purposes. No left-sided pleural effusion. The left lung is clear. No worrisome pulmonary nodules. Azygos fissure is noted. Musculoskeletal: No significant bony findings. Review of the MIP images confirms the above findings. CT ABDOMEN and PELVIS FINDINGS Hepatobiliary: No focal hepatic lesions or intrahepatic biliary dilatation. Gallstones are noted in the gallbladder but no findings for acute cholecystitis. Normal caliber  common bile duct. Pancreas: No mass, inflammation or ductal dilatation. Spleen: Normal size.  No focal lesions. Adrenals/Urinary Tract: The adrenal glands and kidneys are unremarkable. The bladder is normal. Stomach/Bowel: The stomach, duodenum, small bowel and colon are unremarkable. No inflammatory changes, mass lesions or obstructive findings. The terminal ileum is normal. The appendix is normal. Vascular/Lymphatic: No mesenteric or retroperitoneal mass or adenopathy. Moderate atherosclerotic calcifications involving the aorta. The branch vessels patent. The major venous structures are patent. Reproductive: The endometrium is prominent and somewhat irregular for the patient's age. Could not exclude endometrial hyperplasia or neoplasm. Recommend endometrial sampling. The ovaries are normal. Other: No pelvic adenopathy. No free pelvic fluid collections. No inguinal mass or adenopathy. No abdominal wall hernia or subcutaneous lesions. Musculoskeletal:   No significant bony findings. Review of the MIP images confirms the above findings. IMPRESSION: 1. No CT findings for aortic dissection or aneurysm. 2. No CT findings for pulmonary embolism. 3. Large complex right pleural effusion, likely loculated. No obvious enhancing pleural nodules or masses. There is significant compressive atelectasis of the right middle lobe and right lower lobe. Thoracentesis may be helpful for diagnostic and therapeutic purposes. 4. No worrisome lung lesions or endobronchial mass. 5. No acute findings in the abdomen/pelvis. 6. Thickened and somewhat irregular endometrium. Recommend GYN consultation and endometrial sampling. Electronically Signed   By: Marijo Sanes M.D.   On: 09/05/2016 20:30   US Thoracentesis Asp Pleural Space W/img Guide  Result Date: 09/06/2016 INDICATION: Loculated RIGHT pleural effusion EXAM: ULTRASOUND GUIDED DIAGNOSTIC RIGHT THORACENTESIS MEDICATIONS: None. COMPLICATIONS: None immediate. PROCEDURE: Procedure,  benefits, and risks of procedure were discussed with patient. Written informed consent for procedure was obtained. Time out protocol followed. Pleural effusion localized by ultrasound at the posterior RIGHT hemithorax. Skin prepped and draped in usual sterile fashion. Skin and soft tissues anesthetized with 10 mL of 1% lidocaine. 8 French thoracentesis catheter placed into the RIGHT pleural space. 60 mL of grossly purulent fluid aspirated by syringe pump. Procedure tolerated well by patient without immediate complication. FINDINGS: A total of approximately 60 ml of purulent RIGHT pleural fluid was removed. Fluid was sent to the laboratory as requested by the clinical team. IMPRESSION: Successful ultrasound guided RIGHT thoracentesis yielding 60 cc of purulent RIGHT pleural fluid. Electronically Signed   By: Lavonia Dana M.D.   On: 09/06/2016 14:46   Micro Results: Recent Results (from the past 240 hour(s))  MRSA  PCR Screening     Status: None   Collection Time: 09/05/16 10:49 PM  Result Value Ref Range Status   MRSA by PCR NEGATIVE NEGATIVE Final    Comment:        The GeneXpert MRSA Assay (FDA approved for NASAL specimens only), is one component of a comprehensive MRSA colonization surveillance program. It is not intended to diagnose MRSA infection nor to guide or monitor treatment for MRSA infections.   Culture, body fluid-bottle     Status: None (Preliminary result)   Collection Time: 09/06/16  2:45 PM  Result Value Ref Range Status   Specimen Description PLEURAL  Final   Special Requests BOTTLES DRAWN AEROBIC AND ANAEROBIC 10CC EACH  Final   Gram Stain   Final    GRAM POSITIVE COCCI Gram Stain Report Called to,Read Back By and Verified With: HERTEAU,L. AT 2344 ON 09/06/2016 BY AGUNDIZ,E. OBTAINED FROM BOTH BOTTLES    Culture NO GROWTH < 24 HOURS  Final   Report Status PENDING  Incomplete  Gram stain     Status: None   Collection Time: 09/06/16  2:45 PM  Result Value Ref Range  Status   Specimen Description PLEURAL  Final   Special Requests NONE  Final   Gram Stain   Final    GRAM POSITIVE COCCI Gram Stain Report Called to,Read Back By and Verified With: KNIGHT C. AT [id number] ON 09/06/2016 BY EVA   Report Status 09/06/2016 FINAL  Final   Studies/Results: Dg Chest 1 View  Result Date: 09/06/2016 CLINICAL DATA:  Loculated RIGHT pleural effusion post thoracentesis EXAM: CHEST 1 VIEW COMPARISON:  Chest radiograph and chest CT 09/05/2016 FINDINGS: Stable enlarged cardiac silhouette post AVR. Large RIGHT pleural effusion with significant RIGHT lung atelectasis. Azygos fissure noted. LEFT lung demonstrates basilar atelectasis likely related expiratory technique. No pneumothorax following RIGHT thoracentesis. IMPRESSION: No pneumothorax following RIGHT thoracentesis. Electronically Signed   By: Lavonia Dana M.D.   On: 09/06/2016 14:42   Dg Chest 2 View  Result Date: 09/05/2016 CLINICAL DATA:  Increased shortness of breath with right leg swelling. Chills starting at 10 a.m. today. EXAM: CHEST  2 VIEW COMPARISON:  None. FINDINGS: Two views study shows right base collapse/ consolidation with moderate to large right pleural effusion. Soft tissue fullness is noted in the right mediastinum. Left lung appears clear. Cardiopericardial silhouette is at upper limits of normal for size. Patient is status post median sternotomy bones are diffusely demineralized. IMPRESSION: Moderate to large right pleural effusion with right mediastinal fullness in right base collapse/ consolidation. Unilateral disease raises concern for underlying neoplasm. Electronically Signed   By: Misty Stanley M.D.   On: 09/05/2016 17:48   Ct Angio Chest Pe W And/or Wo Contrast  Result Date: 09/05/2016 CLINICAL DATA:  Chest pain, right neck pain and abdominal pain since this morning. EXAM: CT ANGIOGRAPHY CHEST CT ABDOMEN AND PELVIS WITH CONTRAST TECHNIQUE: Multidetector CT imaging of the chest was performed using the  standard protocol during bolus administration of intravenous contrast. Multiplanar CT image reconstructions and MIPs were obtained to evaluate the vascular anatomy. Multidetector CT imaging of the abdomen and pelvis was performed using the standard protocol during bolus administration of intravenous contrast. CONTRAST:  100 cc Isovue 370 COMPARISON:  None. FINDINGS: CTA CHEST FINDINGS Chest wall: No breast masses, supraclavicular or axillary lymphadenopathy. The thyroid gland appears normal. Cardiovascular: The heart is normal in size for age. No pericardial effusion. There is tortuosity, ectasia and calcification of the thoracic aorta. No  focal aneurysm or dissection. The branch vessels are patent. The pulmonary arterial tree is fairly well opacified. No filling defects to suggest pulmonary embolism. Mediastinum/Nodes: Borderline mediastinal lymph nodes. Largest right paratracheal node measures 12 mm on image number 30. Precarinal lymph node on image number 30 measures 7 mm. Aorticopulmonary window lymph node on image number 31 measures 8 mm. Subcarinal lymph node on image number 42 measures 13 mm. No hilar adenopathy. The esophagus is grossly normal. Lungs/Pleura: Large right-sided pleural effusion with probable loculation. No obvious enhancing split pleura sign may suggest empyema. There is significant compressive atelectasis of the right middle lobe and right lower lobe. No enhancing pleural nodules. Thoracentesis may be helpful for diagnostic and therapeutic purposes. No left-sided pleural effusion. The left lung is clear. No worrisome pulmonary nodules. Azygos fissure is noted. Musculoskeletal: No significant bony findings. Review of the MIP images confirms the above findings. CT ABDOMEN and PELVIS FINDINGS Hepatobiliary: No focal hepatic lesions or intrahepatic biliary dilatation. Gallstones are noted in the gallbladder but no findings for acute cholecystitis. Normal caliber common bile duct. Pancreas: No  mass, inflammation or ductal dilatation. Spleen: Normal size.  No focal lesions. Adrenals/Urinary Tract: The adrenal glands and kidneys are unremarkable. The bladder is normal. Stomach/Bowel: The stomach, duodenum, small bowel and colon are unremarkable. No inflammatory changes, mass lesions or obstructive findings. The terminal ileum is normal. The appendix is normal. Vascular/Lymphatic: No mesenteric or retroperitoneal mass or adenopathy. Moderate atherosclerotic calcifications involving the aorta. The branch vessels patent. The major venous structures are patent. Reproductive: The endometrium is prominent and somewhat irregular for the patient's age. Could not exclude endometrial hyperplasia or neoplasm. Recommend endometrial sampling. The ovaries are normal. Other: No pelvic adenopathy. No free pelvic fluid collections. No inguinal mass or adenopathy. No abdominal wall hernia or subcutaneous lesions. Musculoskeletal:   No significant bony findings. Review of the MIP images confirms the above findings. IMPRESSION: 1. No CT findings for aortic dissection or aneurysm. 2. No CT findings for pulmonary embolism. 3. Large complex right pleural effusion, likely loculated. No obvious enhancing pleural nodules or masses. There is significant compressive atelectasis of the right middle lobe and right lower lobe. Thoracentesis may be helpful for diagnostic and therapeutic purposes. 4. No worrisome lung lesions or endobronchial mass. 5. No acute findings in the abdomen/pelvis. 6. Thickened and somewhat irregular endometrium. Recommend GYN consultation and endometrial sampling. Electronically Signed   By: Marijo Sanes M.D.   On: 09/05/2016 20:30   Ct Abdomen Pelvis W Contrast  Result Date: 09/05/2016 CLINICAL DATA:  Chest pain, right neck pain and abdominal pain since this morning. EXAM: CT ANGIOGRAPHY CHEST CT ABDOMEN AND PELVIS WITH CONTRAST TECHNIQUE: Multidetector CT imaging of the chest was performed using the  standard protocol during bolus administration of intravenous contrast. Multiplanar CT image reconstructions and MIPs were obtained to evaluate the vascular anatomy. Multidetector CT imaging of the abdomen and pelvis was performed using the standard protocol during bolus administration of intravenous contrast. CONTRAST:  100 cc Isovue 370 COMPARISON:  None. FINDINGS: CTA CHEST FINDINGS Chest wall: No breast masses, supraclavicular or axillary lymphadenopathy. The thyroid gland appears normal. Cardiovascular: The heart is normal in size for age. No pericardial effusion. There is tortuosity, ectasia and calcification of the thoracic aorta. No focal aneurysm or dissection. The branch vessels are patent. The pulmonary arterial tree is fairly well opacified. No filling defects to suggest pulmonary embolism. Mediastinum/Nodes: Borderline mediastinal lymph nodes. Largest right paratracheal node measures 12 mm on image number  30. Precarinal lymph node on image number 30 measures 7 mm. Aorticopulmonary window lymph node on image number 31 measures 8 mm. Subcarinal lymph node on image number 42 measures 13 mm. No hilar adenopathy. The esophagus is grossly normal. Lungs/Pleura: Large right-sided pleural effusion with probable loculation. No obvious enhancing split pleura sign may suggest empyema. There is significant compressive atelectasis of the right middle lobe and right lower lobe. No enhancing pleural nodules. Thoracentesis may be helpful for diagnostic and therapeutic purposes. No left-sided pleural effusion. The left lung is clear. No worrisome pulmonary nodules. Azygos fissure is noted. Musculoskeletal: No significant bony findings. Review of the MIP images confirms the above findings. CT ABDOMEN and PELVIS FINDINGS Hepatobiliary: No focal hepatic lesions or intrahepatic biliary dilatation. Gallstones are noted in the gallbladder but no findings for acute cholecystitis. Normal caliber common bile duct. Pancreas: No  mass, inflammation or ductal dilatation. Spleen: Normal size.  No focal lesions. Adrenals/Urinary Tract: The adrenal glands and kidneys are unremarkable. The bladder is normal. Stomach/Bowel: The stomach, duodenum, small bowel and colon are unremarkable. No inflammatory changes, mass lesions or obstructive findings. The terminal ileum is normal. The appendix is normal. Vascular/Lymphatic: No mesenteric or retroperitoneal mass or adenopathy. Moderate atherosclerotic calcifications involving the aorta. The branch vessels patent. The major venous structures are patent. Reproductive: The endometrium is prominent and somewhat irregular for the patient's age. Could not exclude endometrial hyperplasia or neoplasm. Recommend endometrial sampling. The ovaries are normal. Other: No pelvic adenopathy. No free pelvic fluid collections. No inguinal mass or adenopathy. No abdominal wall hernia or subcutaneous lesions. Musculoskeletal:   No significant bony findings. Review of the MIP images confirms the above findings. IMPRESSION: 1. No CT findings for aortic dissection or aneurysm. 2. No CT findings for pulmonary embolism. 3. Large complex right pleural effusion, likely loculated. No obvious enhancing pleural nodules or masses. There is significant compressive atelectasis of the right middle lobe and right lower lobe. Thoracentesis may be helpful for diagnostic and therapeutic purposes. 4. No worrisome lung lesions or endobronchial mass. 5. No acute findings in the abdomen/pelvis. 6. Thickened and somewhat irregular endometrium. Recommend GYN consultation and endometrial sampling. Electronically Signed   By: Marijo Sanes M.D.   On: 09/05/2016 20:30   US Thoracentesis Asp Pleural Space W/img Guide  Result Date: 09/06/2016 INDICATION: Loculated RIGHT pleural effusion EXAM: ULTRASOUND GUIDED DIAGNOSTIC RIGHT THORACENTESIS MEDICATIONS: None. COMPLICATIONS: None immediate. PROCEDURE: Procedure, benefits, and risks of procedure  were discussed with patient. Written informed consent for procedure was obtained. Time out protocol followed. Pleural effusion localized by ultrasound at the posterior RIGHT hemithorax. Skin prepped and draped in usual sterile fashion. Skin and soft tissues anesthetized with 10 mL of 1% lidocaine. 8 French thoracentesis catheter placed into the RIGHT pleural space. 60 mL of grossly purulent fluid aspirated by syringe pump. Procedure tolerated well by patient without immediate complication. FINDINGS: A total of approximately 60 ml of purulent RIGHT pleural fluid was removed. Fluid was sent to the laboratory as requested by the clinical team. IMPRESSION: Successful ultrasound guided RIGHT thoracentesis yielding 60 cc of purulent RIGHT pleural fluid. Electronically Signed   By: Lavonia Dana M.D.   On: 09/06/2016 14:46   Medications:  I have reviewed the patient's current medications Scheduled Meds: . donepezil  10 mg Oral QHS  . enoxaparin (LOVENOX) injection  40 mg Subcutaneous Q24H  . furosemide  20 mg Oral Daily  . metoprolol succinate  25 mg Oral Daily  . piperacillin-tazobactam (ZOSYN)  IV  3.375 g Intravenous Q8H  . potassium chloride  20 mEq Oral TID  . rosuvastatin  5 mg Oral Daily  . vancomycin  750 mg Intravenous Q12H   Continuous Infusions: . sodium chloride 10 mL/hr (09/05/16 2329)   PRN Meds:.acetaminophen, ondansetron **OR** ondansetron (ZOFRAN) IV   Assessment/Plan: #1. Loculated purulent pleural effusion. Thoracentesis obtained 60 mL of purulent fluid. Gram stain is positive for gram-positive cocci. Culture pending. Protein is 3.8 and glucose is less than 20. Cell count is 793,343 with 89% neutrophils. We'll continue antibiotic therapy. Will discuss with pulmonology this morning and consult with thoracic surgery. #2. Hypokalemia. Metabolic profile pending. #3. Mild dementia. Stable. #4. Hypoalbuminemia. #5. Status post aortic valve replacement. Echo reveals normal systolic  function with grade 2 diastolic dysfunction, moderate LVH and a stable aortic valve. Active Problems:   MCI (mild cognitive impairment) with memory loss   Pleural effusion   Pleural effusion on right     LOS: 2 days   Shawne Eskelson 09/07/2016, 7:15 AM

## 2016-09-07 NOTE — Progress Notes (Signed)
Patient Tx to [facility] from [facility]. No Chest Pain. Complaining of CP and coughing. VSS. Dr. Cyndia Bent paged to make aware of arrival. Patient oriented to Unit and room. Call bell within reach. Will continue to monitor.   Domingo Dimes RN

## 2016-09-07 NOTE — Brief Op Note (Signed)
09/05/2016 - 09/07/2016  5:10 PM  PATIENT:  Judith Schroeder  70 y.o. female  PRE-OPERATIVE DIAGNOSIS:  empyema  POST-OPERATIVE DIAGNOSIS:  empyema  PROCEDURE:  Procedure(s):  Right thoracotomy and drainage of empyema, decortication of the right lung  SURGEON:  Surgeon(s) and Role:    * Gaye Pollack, MD - Primary  PHYSICIAN ASSISTANT: none ASSISTANTS:  Allyssa Hegarty, RNFA   ANESTHESIA:   general  EBL:  Total I/O In: 1000 [I.V.:1000] Out: 550 [Urine:400; Blood:150]  BLOOD ADMINISTERED:none  DRAINS: (3 30F) Blake drain(s) in the right pleural space   LOCAL MEDICATIONS USED:  NONE  SPECIMEN:  Source of Specimen:  right pleural effusion, pleural peel  DISPOSITION OF SPECIMEN:  micro  COUNTS:  YES  TOURNIQUET:  * No tourniquets in log *  DICTATION: .Note written in EPIC  PLAN OF CARE: Admit to inpatient   PATIENT DISPOSITION:  PACU - hemodynamically stable.   Delay start of Pharmacological VTE agent (>24hrs) due to surgical blood loss or risk of bleeding: no

## 2016-09-07 NOTE — Anesthesia Preprocedure Evaluation (Addendum)
Anesthesia Evaluation  Patient identified by MRN, date of birth, ID band Patient awake    Reviewed: Allergy & Precautions, NPO status , Patient's Chart, lab work & pertinent test results  Airway Mallampati: II  TM Distance: >3 FB Neck ROM: Full    Dental  (+) Teeth Intact, Dental Advisory Given   Pulmonary     + decreased breath sounds      Cardiovascular hypertension,  Rhythm:Regular Rate:Tachycardia     Neuro/Psych    GI/Hepatic   Endo/Other    Renal/GU      Musculoskeletal   Abdominal (+) + obese,   Peds  Hematology   Anesthesia Other Findings Discussed poss. Post-op ventilation  Reproductive/Obstetrics                           Anesthesia Physical Anesthesia Plan  ASA: III  Anesthesia Plan: General   Post-op Pain Management:    Induction: Intravenous  Airway Management Planned: Double Lumen EBT  Additional Equipment: Arterial line and CVP  Intra-op Plan:   Post-operative Plan: Extubation in OR  Informed Consent: I have reviewed the patients History and Physical, chart, labs and discussed the procedure including the risks, benefits and alternatives for the proposed anesthesia with the patient or authorized representative who has indicated his/her understanding and acceptance.   Dental advisory given  Plan Discussed with: CRNA and Anesthesiologist  Anesthesia Plan Comments:         Anesthesia Quick Evaluation

## 2016-09-07 NOTE — Consult Note (Signed)
[address]       [place] [address]             [phone]      Cardiothoracic Surgery Consultation   Reason for Consult: right empyema Referring Physician:  Dr. Larwance Rote RAIZY AUZENNE is an 70 y.o. female.  HPI:   The patient is a 70 year old woman who had an AVR with a tissue valve at [facility] through a right anterior thoracotomy incision in 2012. She has done well until about a month ago when she started having some shortness of breath and though her heart failure was returning. Over the past week she has had subjective fever, nonproductive cough, shortness of breath and a lot of pain in the right side of her chest. She is an avid golfer and was on a golf trip earlier this week but says she could not even play the last couple days. She presented to [facility] and a CT showed a large loculated right pleural effusion with compressive atelectasis of the right lung. A thoracentesis was performed yesterday removing 60 cc of grossly purulent fluid and gram stain showed gram + cocci. I was called this am by Dr. Willey Blade and the patient was transferred for surgical drainage.  Her history is otherwise significant for hyperlipidemia, HTN, mild cognitive impairment with memory loss on Aricept.     Past Medical History:  Diagnosis Date  . Carpal tunnel syndrome   . H/O aortic valve replacement   . High blood pressure   . High cholesterol   . Kidney stones     Past Surgical History:  Procedure Laterality Date  . AORTIC VALVE REPLACEMENT  2012  . TUBAL LIGATION      Family History  Problem Relation Age of Onset  . Hypertension Mother   . Cancer Father     head and neck     Social History:  reports that she has never smoked. She has never used smokeless tobacco. She reports that she does not drink alcohol or use drugs.  Allergies:  Allergies  Allergen Reactions  . Atorvastatin Other (See Comments)    Muscle pain    Medications:  I have reviewed the patient's  current medications. Prior to Admission:  Prescriptions Prior to Admission  Medication Sig Dispense Refill Last Dose  . aspirin EC 325 MG tablet Take 325 mg by mouth daily.   09/05/2016 at Unknown time  . donepezil (ARICEPT) 10 MG tablet Take 1 tablet (10 mg total) by mouth at bedtime. 30 tablet 12 09/04/2016 at Unknown time  . eplerenone (INSPRA) 25 MG tablet Take 25 mg by mouth daily.   09/05/2016 at Unknown time  . furosemide (LASIX) 20 MG tablet Take 20 mg by mouth daily.    09/05/2016 at Unknown time  . metoprolol succinate (TOPROL-XL) 25 MG 24 hr tablet Take 25 mg by mouth daily.   09/05/2016 at Unknown time  . rosuvastatin (CRESTOR) 5 MG tablet Take 5 mg by mouth daily.   09/05/2016 at Unknown time   Scheduled: . sodium chloride   Intravenous Once  . donepezil  10 mg Oral QHS  . enoxaparin (LOVENOX) injection  40 mg Subcutaneous Q24H  . furosemide  20 mg Oral Daily  . metoprolol succinate  25 mg Oral Daily  . piperacillin-tazobactam (ZOSYN)  IV  3.375 g Intravenous Q8H  . potassium chloride  20 mEq Oral TID  . rosuvastatin  5 mg Oral Daily  .  vancomycin  750 mg Intravenous Q12H   Continuous: . sodium chloride 10 mL/hr (09/05/16 2329)   SAY:[id number], ondansetron **OR** ondansetron (ZOFRAN) IV Anti-infectives    Start     Dose/Rate Route Frequency Ordered Stop   09/06/16 1500  vancomycin (VANCOCIN) IVPB 750 mg/150 ml premix     750 mg 150 mL/hr over 60 Minutes Intravenous Every 12 hours 09/06/16 0934     09/06/16 1100  piperacillin-tazobactam (ZOSYN) IVPB 3.375 g     3.375 g 12.5 mL/hr over 240 Minutes Intravenous Every 8 hours 09/06/16 0934     09/06/16 0500  piperacillin-tazobactam (ZOSYN) IVPB 3.375 g     3.375 g 12.5 mL/hr over 240 Minutes Intravenous  Once 09/06/16 0236 09/06/16 0945   09/06/16 0245  vancomycin (VANCOCIN) IVPB 1000 mg/200 mL premix     1,000 mg 200 mL/hr over 60 Minutes Intravenous  Once 09/06/16 0241 09/06/16 0433   09/05/16 2045  vancomycin  (VANCOCIN) IVPB 1000 mg/200 mL premix     1,000 mg 200 mL/hr over 60 Minutes Intravenous  Once 09/05/16 2039 09/05/16 2212   09/05/16 2045  piperacillin-tazobactam (ZOSYN) IVPB 3.375 g     3.375 g 100 mL/hr over 30 Minutes Intravenous  Once 09/05/16 2039 09/05/16 2142      Results for orders placed or performed during the hospital encounter of 09/05/16 (from the past 48 hour(s))  CBC     Status: Abnormal   Collection Time: 09/05/16  5:11 PM  Result Value Ref Range   WBC 17.2 (H) 4.0 - 10.5 K/uL   RBC 3.63 (L) 3.87 - 5.11 MIL/uL   Hemoglobin 11.4 (L) 12.0 - 15.0 g/dL   HCT 33.7 (L) 36.0 - 46.0 %   MCV 92.8 78.0 - 100.0 fL   MCH 31.4 26.0 - 34.0 pg   MCHC 33.8 30.0 - 36.0 g/dL   RDW 13.0 11.5 - 15.5 %   Platelets 510 (H) 150 - 400 K/uL  Comprehensive metabolic panel     Status: Abnormal   Collection Time: 09/05/16  5:11 PM  Result Value Ref Range   Sodium 131 (L) 135 - 145 mmol/L   Potassium 3.0 (L) 3.5 - 5.1 mmol/L   Chloride 99 (L) 101 - 111 mmol/L   CO2 27 22 - 32 mmol/L   Glucose, Bld 133 (H) 65 - 99 mg/dL   BUN 16 6 - 20 mg/dL   Creatinine, Ser 0.88 0.44 - 1.00 mg/dL   Calcium 7.4 (L) 8.9 - 10.3 mg/dL   Total Protein 6.7 6.5 - 8.1 g/dL   Albumin 2.3 (L) 3.5 - 5.0 g/dL   AST 65 (H) 15 - 41 U/L   ALT 60 (H) 14 - 54 U/L   Alkaline Phosphatase 104 38 - 126 U/L   Total Bilirubin 0.8 0.3 - 1.2 mg/dL   GFR calc non Af Amer >60 >60 mL/min   GFR calc Af Amer >60 >60 mL/min    Comment: (NOTE) The eGFR has been calculated using the CKD EPI equation. This calculation has not been validated in all clinical situations. eGFR's persistently <60 mL/min signify possible Chronic Kidney Disease.    Anion gap 5 5 - 15  Troponin I     Status: None   Collection Time: 09/05/16  5:11 PM  Result Value Ref Range   Troponin I <0.03 <0.03 ng/mL  Brain natriuretic peptide     Status: Abnormal   Collection Time: 09/05/16  5:11 PM  Result Value Ref Range   B  Natriuretic Peptide 127.0 (H)  0.0 - 100.0 pg/mL  Protime-INR     Status: None   Collection Time: 09/05/16  5:11 PM  Result Value Ref Range   Prothrombin Time 15.0 11.4 - 15.2 seconds   INR 1.17   MRSA PCR Screening     Status: None   Collection Time: 09/05/16 10:49 PM  Result Value Ref Range   MRSA by PCR NEGATIVE NEGATIVE    Comment:        The GeneXpert MRSA Assay (FDA approved for NASAL specimens only), is one component of a comprehensive MRSA colonization surveillance program. It is not intended to diagnose MRSA infection nor to guide or monitor treatment for MRSA infections.   CBC     Status: Abnormal   Collection Time: 09/06/16  5:03 AM  Result Value Ref Range   WBC 15.6 (H) 4.0 - 10.5 K/uL   RBC 3.45 (L) 3.87 - 5.11 MIL/uL   Hemoglobin 10.6 (L) 12.0 - 15.0 g/dL   HCT 32.3 (L) 36.0 - 46.0 %   MCV 93.6 78.0 - 100.0 fL   MCH 30.7 26.0 - 34.0 pg   MCHC 32.8 30.0 - 36.0 g/dL   RDW 13.2 11.5 - 15.5 %   Platelets 479 (H) 150 - 400 K/uL  Comprehensive metabolic panel     Status: Abnormal   Collection Time: 09/06/16  5:03 AM  Result Value Ref Range   Sodium 133 (L) 135 - 145 mmol/L   Potassium 3.3 (L) 3.5 - 5.1 mmol/L   Chloride 96 (L) 101 - 111 mmol/L   CO2 30 22 - 32 mmol/L   Glucose, Bld 151 (H) 65 - 99 mg/dL   BUN 11 6 - 20 mg/dL   Creatinine, Ser 0.79 0.44 - 1.00 mg/dL   Calcium 7.8 (L) 8.9 - 10.3 mg/dL   Total Protein 6.0 (L) 6.5 - 8.1 g/dL   Albumin 2.0 (L) 3.5 - 5.0 g/dL   AST 36 15 - 41 U/L   ALT 45 14 - 54 U/L   Alkaline Phosphatase 92 38 - 126 U/L   Total Bilirubin 1.1 0.3 - 1.2 mg/dL   GFR calc non Af Amer >60 >60 mL/min   GFR calc Af Amer >60 >60 mL/min    Comment: (NOTE) The eGFR has been calculated using the CKD EPI equation. This calculation has not been validated in all clinical situations. eGFR's persistently <60 mL/min signify possible Chronic Kidney Disease.    Anion gap 7 5 - 15  Protein, pleural or peritoneal fluid     Status: None   Collection Time: 09/06/16  2:25  PM  Result Value Ref Range   Total protein, fluid 3.8 g/dL    Comment: (NOTE) No normal range established for this test Results should be evaluated in conjunction with serum values    Fluid Type-FTP Pleural R   Glucose, pleural or peritoneal fluid     Status: None   Collection Time: 09/06/16  2:45 PM  Result Value Ref Range   Glucose, Fluid <20 mg/dL    Comment: (NOTE) No normal range established for this test Results should be evaluated in conjunction with serum values    Fluid Type-FGLU Pleural Fld   Body fluid cell count with differential     Status: Abnormal   Collection Time: 09/06/16  2:45 PM  Result Value Ref Range   Fluid Type-FCT PLEURAL     Comment: CORRECTED ON 09/22 AT 1518: PREVIOUSLY REPORTED AS Body Fluid   Color,  Fluid BROWN (A) YELLOW   Appearance, Fluid TURBID (A) CLEAR   WBC, Fluid 793,343 (H) 0 - 1,000 cu mm    Comment: RESULTS CONFIRMED BY MANUAL DILUTION   Neutrophil Count, Fluid 89 (H) 0 - 25 %   Lymphs, Fluid 6 %   Monocyte-Macrophage-Serous Fluid 5 (L) 50 - 90 %   Eos, Fluid 0 %   Other Cells, Fluid 0 %  Culture, body fluid-bottle     Status: None (Preliminary result)   Collection Time: 09/06/16  2:45 PM  Result Value Ref Range   Specimen Description PLEURAL    Special Requests BOTTLES DRAWN AEROBIC AND ANAEROBIC 10CC EACH    Gram Stain      GRAM POSITIVE COCCI Gram Stain Report Called to,Read Back By and Verified With: HERTEAU,L. AT 2344 ON 09/06/2016 BY AGUNDIZ,E. OBTAINED FROM BOTH BOTTLES    Culture NO GROWTH < 24 HOURS    Report Status PENDING   Gram stain     Status: None   Collection Time: 09/06/16  2:45 PM  Result Value Ref Range   Specimen Description PLEURAL    Special Requests NONE    Gram Stain      GRAM POSITIVE COCCI Gram Stain Report Called to,Read Back By and Verified With: KNIGHT C. AT [id number] ON 09/06/2016 BY EVA   Report Status 09/06/2016 FINAL   Lactate dehydrogenase (CSF, pleural or peritoneal fluid)     Status: Abnormal    Collection Time: 09/06/16  2:47 PM  Result Value Ref Range   LD, Fluid >10,000 (H) 3 - 23 U/L    Comment: RESULTS CONFIRMED BY MANUAL DILUTION (NOTE) Results should be evaluated in conjunction with serum values    Fluid Type-FLDH Pleural R   Basic metabolic panel     Status: Abnormal   Collection Time: 09/07/16  5:53 AM  Result Value Ref Range   Sodium 135 135 - 145 mmol/L   Potassium 3.6 3.5 - 5.1 mmol/L   Chloride 98 (L) 101 - 111 mmol/L   CO2 30 22 - 32 mmol/L   Glucose, Bld 157 (H) 65 - 99 mg/dL   BUN 13 6 - 20 mg/dL   Creatinine, Ser 0.84 0.44 - 1.00 mg/dL   Calcium 8.0 (L) 8.9 - 10.3 mg/dL   GFR calc non Af Amer >60 >60 mL/min   GFR calc Af Amer >60 >60 mL/min    Comment: (NOTE) The eGFR has been calculated using the CKD EPI equation. This calculation has not been validated in all clinical situations. eGFR's persistently <60 mL/min signify possible Chronic Kidney Disease.    Anion gap 7 5 - 15    Dg Chest 1 View  Result Date: 09/06/2016 CLINICAL DATA:  Loculated RIGHT pleural effusion post thoracentesis EXAM: CHEST 1 VIEW COMPARISON:  Chest radiograph and chest CT 09/05/2016 FINDINGS: Stable enlarged cardiac silhouette post AVR. Large RIGHT pleural effusion with significant RIGHT lung atelectasis. Azygos fissure noted. LEFT lung demonstrates basilar atelectasis likely related expiratory technique. No pneumothorax following RIGHT thoracentesis. IMPRESSION: No pneumothorax following RIGHT thoracentesis. Electronically Signed   By: Lavonia Dana M.D.   On: 09/06/2016 14:42   Dg Chest 2 View  Result Date: 09/05/2016 CLINICAL DATA:  Increased shortness of breath with right leg swelling. Chills starting at 10 a.m. today. EXAM: CHEST  2 VIEW COMPARISON:  None. FINDINGS: Two views study shows right base collapse/ consolidation with moderate to large right pleural effusion. Soft tissue fullness is noted in the right mediastinum. Left lung  appears clear. Cardiopericardial silhouette is  at upper limits of normal for size. Patient is status post median sternotomy bones are diffusely demineralized. IMPRESSION: Moderate to large right pleural effusion with right mediastinal fullness in right base collapse/ consolidation. Unilateral disease raises concern for underlying neoplasm. Electronically Signed   By: Misty Stanley M.D.   On: 09/05/2016 17:48   Ct Angio Chest Pe W And/or Wo Contrast  Result Date: 09/05/2016 CLINICAL DATA:  Chest pain, right neck pain and abdominal pain since this morning. EXAM: CT ANGIOGRAPHY CHEST CT ABDOMEN AND PELVIS WITH CONTRAST TECHNIQUE: Multidetector CT imaging of the chest was performed using the standard protocol during bolus administration of intravenous contrast. Multiplanar CT image reconstructions and MIPs were obtained to evaluate the vascular anatomy. Multidetector CT imaging of the abdomen and pelvis was performed using the standard protocol during bolus administration of intravenous contrast. CONTRAST:  100 cc Isovue 370 COMPARISON:  None. FINDINGS: CTA CHEST FINDINGS Chest wall: No breast masses, supraclavicular or axillary lymphadenopathy. The thyroid gland appears normal. Cardiovascular: The heart is normal in size for age. No pericardial effusion. There is tortuosity, ectasia and calcification of the thoracic aorta. No focal aneurysm or dissection. The branch vessels are patent. The pulmonary arterial tree is fairly well opacified. No filling defects to suggest pulmonary embolism. Mediastinum/Nodes: Borderline mediastinal lymph nodes. Largest right paratracheal node measures 12 mm on image number 30. Precarinal lymph node on image number 30 measures 7 mm. Aorticopulmonary window lymph node on image number 31 measures 8 mm. Subcarinal lymph node on image number 42 measures 13 mm. No hilar adenopathy. The esophagus is grossly normal. Lungs/Pleura: Large right-sided pleural effusion with probable loculation. No obvious enhancing split pleura sign may  suggest empyema. There is significant compressive atelectasis of the right middle lobe and right lower lobe. No enhancing pleural nodules. Thoracentesis may be helpful for diagnostic and therapeutic purposes. No left-sided pleural effusion. The left lung is clear. No worrisome pulmonary nodules. Azygos fissure is noted. Musculoskeletal: No significant bony findings. Review of the MIP images confirms the above findings. CT ABDOMEN and PELVIS FINDINGS Hepatobiliary: No focal hepatic lesions or intrahepatic biliary dilatation. Gallstones are noted in the gallbladder but no findings for acute cholecystitis. Normal caliber common bile duct. Pancreas: No mass, inflammation or ductal dilatation. Spleen: Normal size.  No focal lesions. Adrenals/Urinary Tract: The adrenal glands and kidneys are unremarkable. The bladder is normal. Stomach/Bowel: The stomach, duodenum, small bowel and colon are unremarkable. No inflammatory changes, mass lesions or obstructive findings. The terminal ileum is normal. The appendix is normal. Vascular/Lymphatic: No mesenteric or retroperitoneal mass or adenopathy. Moderate atherosclerotic calcifications involving the aorta. The branch vessels patent. The major venous structures are patent. Reproductive: The endometrium is prominent and somewhat irregular for the patient's age. Could not exclude endometrial hyperplasia or neoplasm. Recommend endometrial sampling. The ovaries are normal. Other: No pelvic adenopathy. No free pelvic fluid collections. No inguinal mass or adenopathy. No abdominal wall hernia or subcutaneous lesions. Musculoskeletal:   No significant bony findings. Review of the MIP images confirms the above findings. IMPRESSION: 1. No CT findings for aortic dissection or aneurysm. 2. No CT findings for pulmonary embolism. 3. Large complex right pleural effusion, likely loculated. No obvious enhancing pleural nodules or masses. There is significant compressive atelectasis of the right  middle lobe and right lower lobe. Thoracentesis may be helpful for diagnostic and therapeutic purposes. 4. No worrisome lung lesions or endobronchial mass. 5. No acute findings in the abdomen/pelvis. 6. Thickened  and somewhat irregular endometrium. Recommend GYN consultation and endometrial sampling. Electronically Signed   By: Marijo Sanes M.D.   On: 09/05/2016 20:30   Ct Abdomen Pelvis W Contrast  Result Date: 09/05/2016 CLINICAL DATA:  Chest pain, right neck pain and abdominal pain since this morning. EXAM: CT ANGIOGRAPHY CHEST CT ABDOMEN AND PELVIS WITH CONTRAST TECHNIQUE: Multidetector CT imaging of the chest was performed using the standard protocol during bolus administration of intravenous contrast. Multiplanar CT image reconstructions and MIPs were obtained to evaluate the vascular anatomy. Multidetector CT imaging of the abdomen and pelvis was performed using the standard protocol during bolus administration of intravenous contrast. CONTRAST:  100 cc Isovue 370 COMPARISON:  None. FINDINGS: CTA CHEST FINDINGS Chest wall: No breast masses, supraclavicular or axillary lymphadenopathy. The thyroid gland appears normal. Cardiovascular: The heart is normal in size for age. No pericardial effusion. There is tortuosity, ectasia and calcification of the thoracic aorta. No focal aneurysm or dissection. The branch vessels are patent. The pulmonary arterial tree is fairly well opacified. No filling defects to suggest pulmonary embolism. Mediastinum/Nodes: Borderline mediastinal lymph nodes. Largest right paratracheal node measures 12 mm on image number 30. Precarinal lymph node on image number 30 measures 7 mm. Aorticopulmonary window lymph node on image number 31 measures 8 mm. Subcarinal lymph node on image number 42 measures 13 mm. No hilar adenopathy. The esophagus is grossly normal. Lungs/Pleura: Large right-sided pleural effusion with probable loculation. No obvious enhancing split pleura sign may suggest  empyema. There is significant compressive atelectasis of the right middle lobe and right lower lobe. No enhancing pleural nodules. Thoracentesis may be helpful for diagnostic and therapeutic purposes. No left-sided pleural effusion. The left lung is clear. No worrisome pulmonary nodules. Azygos fissure is noted. Musculoskeletal: No significant bony findings. Review of the MIP images confirms the above findings. CT ABDOMEN and PELVIS FINDINGS Hepatobiliary: No focal hepatic lesions or intrahepatic biliary dilatation. Gallstones are noted in the gallbladder but no findings for acute cholecystitis. Normal caliber common bile duct. Pancreas: No mass, inflammation or ductal dilatation. Spleen: Normal size.  No focal lesions. Adrenals/Urinary Tract: The adrenal glands and kidneys are unremarkable. The bladder is normal. Stomach/Bowel: The stomach, duodenum, small bowel and colon are unremarkable. No inflammatory changes, mass lesions or obstructive findings. The terminal ileum is normal. The appendix is normal. Vascular/Lymphatic: No mesenteric or retroperitoneal mass or adenopathy. Moderate atherosclerotic calcifications involving the aorta. The branch vessels patent. The major venous structures are patent. Reproductive: The endometrium is prominent and somewhat irregular for the patient's age. Could not exclude endometrial hyperplasia or neoplasm. Recommend endometrial sampling. The ovaries are normal. Other: No pelvic adenopathy. No free pelvic fluid collections. No inguinal mass or adenopathy. No abdominal wall hernia or subcutaneous lesions. Musculoskeletal:   No significant bony findings. Review of the MIP images confirms the above findings. IMPRESSION: 1. No CT findings for aortic dissection or aneurysm. 2. No CT findings for pulmonary embolism. 3. Large complex right pleural effusion, likely loculated. No obvious enhancing pleural nodules or masses. There is significant compressive atelectasis of the right middle  lobe and right lower lobe. Thoracentesis may be helpful for diagnostic and therapeutic purposes. 4. No worrisome lung lesions or endobronchial mass. 5. No acute findings in the abdomen/pelvis. 6. Thickened and somewhat irregular endometrium. Recommend GYN consultation and endometrial sampling. Electronically Signed   By: Marijo Sanes M.D.   On: 09/05/2016 20:30   US Thoracentesis Asp Pleural Space W/img Guide  Result Date: 09/06/2016 INDICATION: Loculated  RIGHT pleural effusion EXAM: ULTRASOUND GUIDED DIAGNOSTIC RIGHT THORACENTESIS MEDICATIONS: None. COMPLICATIONS: None immediate. PROCEDURE: Procedure, benefits, and risks of procedure were discussed with patient. Written informed consent for procedure was obtained. Time out protocol followed. Pleural effusion localized by ultrasound at the posterior RIGHT hemithorax. Skin prepped and draped in usual sterile fashion. Skin and soft tissues anesthetized with 10 mL of 1% lidocaine. 8 French thoracentesis catheter placed into the RIGHT pleural space. 60 mL of grossly purulent fluid aspirated by syringe pump. Procedure tolerated well by patient without immediate complication. FINDINGS: A total of approximately 60 ml of purulent RIGHT pleural fluid was removed. Fluid was sent to the laboratory as requested by the clinical team. IMPRESSION: Successful ultrasound guided RIGHT thoracentesis yielding 60 cc of purulent RIGHT pleural fluid. Electronically Signed   By: Lavonia Dana M.D.   On: 09/06/2016 14:46    Review of Systems  Constitutional: Positive for chills, diaphoresis, fever and malaise/fatigue. Negative for weight loss.  HENT: Negative.   Eyes: Negative.   Respiratory: Positive for cough and shortness of breath. Negative for hemoptysis and sputum production.   Cardiovascular: Positive for chest pain, orthopnea and leg swelling.  Gastrointestinal: Negative.   Musculoskeletal: Negative.   Skin: Negative.   Neurological: Negative.        Some memory loss   Endo/Heme/Allergies: Negative.   Psychiatric/Behavioral: Negative.    Blood pressure (!) 142/67, pulse 86, temperature 98.2 F (36.8 C), temperature source Oral, resp. rate 20, height _0  (1.6 m), weight 102 kg (224 lb 13.9 oz), SpO2 91 %. Physical Exam  Constitutional: She is oriented to person, place, and time.  Obese woman in no distress, coughing alot  HENT:  Head: Normocephalic and atraumatic.  Mouth/Throat: Oropharynx is clear and moist.  Eyes: EOM are normal. Pupils are equal, round, and reactive to light.  Neck: Normal range of motion. Neck supple. No JVD present. No tracheal deviation present. No thyromegaly present.  Cardiovascular: Normal rate, regular rhythm, normal heart sounds and intact distal pulses.   No murmur heard. Respiratory: Effort normal.  Decreased breath sounds over the right chest  GI: Soft. Bowel sounds are normal. She exhibits no distension and no mass. There is no tenderness.  Musculoskeletal: Normal range of motion. She exhibits edema.  In lower legs  Lymphadenopathy:    She has no cervical adenopathy.  Neurological: She is alert and oriented to person, place, and time. She has normal strength. No cranial nerve deficit or sensory deficit.  Skin: Skin is warm and dry.  Psychiatric: She has a normal mood and affect.   [place], [place] [address]                            [phone]  ------------------------------------------------------------------- Transthoracic Echocardiography  Patient:    Merial, Moritz MR #:       [id number] Study Date: 09/06/2016 Gender:     F Age:        3 Height: Weight: BSA: Pt. Status: Room:       A315   ADMITTING    [place], [place] Charon  S  SONOGRAPHER  Leavy Cella  PERFORMING   [facility], [facility]  ATTENDING    Mesner,  Corene Cornea  cc:  ------------------------------------------------------------------- LV EF: 60% -   65%  ------------------------------------------------------------------- History:   PMH:  Bovine Aortic Valve Replacement, Pleural Effusion. Cardiomyopathy.  Risk factors:  Dyslipidemia.  ------------------------------------------------------------------- Study Conclusions  - Left ventricle: The cavity size was normal. Wall thickness was   increased in a pattern of moderate LVH. Systolic function was   normal. The estimated ejection fraction was in the range of 60%   to 65%. Wall motion was normal; there were no regional wall   motion abnormalities. Features are consistent with a pseudonormal   left ventricular filling pattern, with concomitant abnormal   relaxation and increased filling pressure (grade 2 diastolic   dysfunction). Doppler parameters are consistent with high   ventricular filling pressure. - Aortic valve: There is a 54m Magna pericardial valve in the AV   position. Normal gradients across prosthetic valve. Mean gradient   (S): 12 mm Hg. Valve area (VTI): 2.12 cm^2. Valve area (Vmax):   1.84 cm^2. - Left atrium: The atrium was moderately dilated. - Pulmonary arteries: Systolic pressure was mildly increased. PA   peak pressure: 38 mm Hg (S). - Systemic veins: IVC is small, suggesting low RA pressure and and   hypovolemia. - Technically adequate study.  ------------------------------------------------------------------- Study data:  No prior study was available for comparison.  Study status:  Routine.  Procedure:  Transthoracic echocardiography. Image quality was adequate.          Transthoracic echocardiography.  M-mode, complete 2D, spectral Doppler, and color Doppler.  Birthdate:  Patient birthdate: 1Jun 08, 1947  Age:  Patient is 70yr old.  Sex:  Gender: female.  Blood pressure:     142/57 Patient status:  Inpatient.  Study date:  Study date:  09/06/2016. Study time: 04:11 PM.  Location:  Bedside.  -------------------------------------------------------------------  ------------------------------------------------------------------- Left ventricle:  The cavity size was normal. Wall thickness was increased in a pattern of moderate LVH. Systolic function was normal. The estimated ejection fraction was in the range of 60% to 65%. Wall motion was normal; there were no regional wall motion abnormalities. Features are consistent with a pseudonormal left ventricular filling pattern, with concomitant abnormal relaxation and increased filling pressure (grade 2 diastolic dysfunction). Doppler parameters are consistent with high ventricular filling pressure.  ------------------------------------------------------------------- Aortic valve:  There is a 255mMagna pericardial valve in the AV position. Normal gradients across prosthetic valve.  Doppler: There was no stenosis.   There was no significant regurgitation. VTI ratio of LVOT to aortic valve: 0.51. Valve area (VTI): 2.12 cm^2. Peak velocity ratio of LVOT to aortic valve: 0.44. Valve area (Vmax): 1.84 cm^2. Mean velocity ratio of LVOT to aortic valve: 0.4.    Mean gradient (S): 12 mm Hg. Peak gradient (S): 22 mm Hg.   ------------------------------------------------------------------- Aorta:  Aortic root: The aortic root was normal in size.  ------------------------------------------------------------------- Mitral valve:   Normal thickness leaflets .  Doppler:   There was no evidence for stenosis.   There was trivial regurgitation. Peak gradient (D): 7 mm Hg.  ------------------------------------------------------------------- Left atrium:  The atrium was moderately dilated.  ------------------------------------------------------------------- Right ventricle:  The cavity size was normal. Systolic function  was normal.  ------------------------------------------------------------------- Pulmonic valve:   Not well visualized.  Doppler:   There was no evidence for stenosis.   There was no significant regurgitation.   ------------------------------------------------------------------- Tricuspid valve:   Normal thickness leaflets.  Doppler:  There was no evidence for stenosis.   There was mild regurgitation.  ------------------------------------------------------------------- Pulmonary artery:   Systolic pressure was mildly increased.  ------------------------------------------------------------------- Right atrium:  The atrium was normal in size.  ------------------------------------------------------------------- Pericardium:  Not well visualized. There was no pericardial effusion.  ------------------------------------------------------------------- Systemic veins:  IVC is small, suggesting low RA pressure and and hypovolemia.  ------------------------------------------------------------------- Measurements   Left ventricle                           Value        Reference  LV ID, ED, PLAX chordal          (L)     34.2   mm    43 - 52  LV ID, ES, PLAX chordal          (L)     19.1   mm    23 - 38  LV fx shortening, PLAX chordal           44     %     >=29  LV PW thickness, ED                      12     mm    ---------  IVS/LV PW ratio, ED                      1            <=1.3  LV ejection time                         290    ms    ---------    Ventricular septum                       Value        Reference  IVS thickness, ED                        12     mm    ---------    LVOT                                     Value        Reference  LVOT ID, S                               23     mm    ---------  LVOT area                                4.15   cm^2  ---------  LVOT peak velocity, S                    102.82 cm/s  ---------  LVOT mean velocity, S                     67.1   cm/s  ---------  LVOT VTI, S  24.87  cm    ---------    Aortic valve                             Value        Reference  Aortic valve peak velocity, S            232.79 cm/s  ---------  Aortic valve mean velocity, S            168.77 cm/s  ---------  Aortic valve VTI, S                      48.73  cm    ---------  Aortic mean gradient, S                  12     mm Hg ---------  Aortic peak gradient, S                  22     mm Hg ---------  VTI ratio, LVOT/AV                       0.51         ---------  Aortic valve area, VTI                   2.12   cm^2  ---------  Velocity ratio, peak, LVOT/AV            0.44         ---------  Aortic valve area, peak velocity         1.84   cm^2  ---------  Velocity ratio, mean, LVOT/AV            0.4          ---------    Aorta                                    Value        Reference  Aortic root ID, ED                       28     mm    ---------    Left atrium                              Value        Reference  LA ID, A-P, ES                           38     mm    ---------  LA volume, S                             72.1   ml    ---------  LA volume, ES, 1-p A4C                   64.5   ml    ---------  LA volume, ES, 1-p A2C                   74.8   ml    ---------    Mitral valve  Value        Reference  Mitral E-wave peak velocity              129    cm/s  ---------  Mitral A-wave peak velocity              104    cm/s  ---------  Mitral deceleration time         (H)     236    ms    150 - 230  Mitral peak gradient, D                  7      mm Hg ---------  Mitral E/A ratio, peak                   1.2          ---------    Pulmonary arteries                       Value        Reference  PA pressure, S, DP               (H)     38     mm Hg <=30    Right ventricle                          Value        Reference  TAPSE                                    21.7   mm     ---------  RV pressure, S, DP               (H)     38     mm Hg <=30  Legend: (L)  and  (H)  mark values outside specified reference range.  ------------------------------------------------------------------- Prepared and Electronically Authenticated by  Kerry Hough, M.D. 2017-09-22T17:03:26  CLINICAL DATA:  Chest pain, right neck pain and abdominal pain since this morning.  EXAM: CT ANGIOGRAPHY CHEST  CT ABDOMEN AND PELVIS WITH CONTRAST  TECHNIQUE: Multidetector CT imaging of the chest was performed using the standard protocol during bolus administration of intravenous contrast. Multiplanar CT image reconstructions and MIPs were obtained to evaluate the vascular anatomy. Multidetector CT imaging of the abdomen and pelvis was performed using the standard protocol during bolus administration of intravenous contrast.  CONTRAST:  100 cc Isovue 370  COMPARISON:  None.  FINDINGS: CTA CHEST FINDINGS  Chest wall: No breast masses, supraclavicular or axillary lymphadenopathy. The thyroid gland appears normal.  Cardiovascular: The heart is normal in size for age. No pericardial effusion. There is tortuosity, ectasia and calcification of the thoracic aorta. No focal aneurysm or dissection. The branch vessels are patent.  The pulmonary arterial tree is fairly well opacified. No filling defects to suggest pulmonary embolism.  Mediastinum/Nodes: Borderline mediastinal lymph nodes. Largest right paratracheal node measures 12 mm on image number 30. Precarinal lymph node on image number 30 measures 7 mm. Aorticopulmonary window lymph node on image number 31 measures 8 mm. Subcarinal lymph node on image number 42 measures 13 mm. No hilar adenopathy. The esophagus is grossly normal.  Lungs/Pleura: Large right-sided pleural effusion with probable loculation. No obvious enhancing split pleura sign may suggest empyema. There is significant  compressive atelectasis  of the right middle lobe and right lower lobe. No enhancing pleural nodules. Thoracentesis may be helpful for diagnostic and therapeutic purposes.  No left-sided pleural effusion. The left lung is clear. No worrisome pulmonary nodules. Azygos fissure is noted.  Musculoskeletal: No significant bony findings.  Review of the MIP images confirms the above findings.  CT ABDOMEN and PELVIS FINDINGS  Hepatobiliary: No focal hepatic lesions or intrahepatic biliary dilatation. Gallstones are noted in the gallbladder but no findings for acute cholecystitis. Normal caliber common bile duct.  Pancreas: No mass, inflammation or ductal dilatation.  Spleen: Normal size.  No focal lesions.  Adrenals/Urinary Tract: The adrenal glands and kidneys are unremarkable. The bladder is normal.  Stomach/Bowel: The stomach, duodenum, small bowel and colon are unremarkable. No inflammatory changes, mass lesions or obstructive findings. The terminal ileum is normal. The appendix is normal.  Vascular/Lymphatic: No mesenteric or retroperitoneal mass or adenopathy. Moderate atherosclerotic calcifications involving the aorta. The branch vessels patent. The major venous structures are patent.  Reproductive: The endometrium is prominent and somewhat irregular for the patient's age. Could not exclude endometrial hyperplasia or neoplasm. Recommend endometrial sampling. The ovaries are normal.  Other: No pelvic adenopathy. No free pelvic fluid collections. No inguinal mass or adenopathy. No abdominal wall hernia or subcutaneous lesions.  Musculoskeletal:   No significant bony findings.  Review of the MIP images confirms the above findings.  IMPRESSION: 1. No CT findings for aortic dissection or aneurysm. 2. No CT findings for pulmonary embolism. 3. Large complex right pleural effusion, likely loculated. No obvious enhancing pleural nodules or masses. There is significant compressive  atelectasis of the right middle lobe and right lower lobe. Thoracentesis may be helpful for diagnostic and therapeutic purposes. 4. No worrisome lung lesions or endobronchial mass. 5. No acute findings in the abdomen/pelvis. 6. Thickened and somewhat irregular endometrium. Recommend GYN consultation and endometrial sampling.   Electronically Signed   By: Marijo Sanes M.D.   On: 09/05/2016 20:30   Assessment/Plan:  I have personally reviewed and interpreted her CT scan and echo. She has a large right empyema yielding frank pus on thoracentesis with gram + cocci. 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 Her LV function is normal. This should be drained today to minimize the chance of bacteremia and infection of her valve. I discussed the surgery with her including alternatives, benefits and risks including but not limited tobleeding, blood transfusion, infection, prolonged air leak from the lung and respiratory failure.   [name] understands and agrees to proceed.  We will schedule surgery for this afternoon.  I spent 60 minutes performing this consultation and > 50% of this time was spent face to face counseling and coordinating the care of this patient's right empyema.  Gaye Pollack 09/07/2016, 12:21 PM

## 2016-09-07 NOTE — Op Note (Signed)
09/07/2016 Judith Schroeder [id number]  Surgeon: Gaye Pollack, MD   First Assistant: Filiberto Pinks, RNFA  Preoperative Diagnosis: Right empyema   Postoperative Diagnosis: Right empyema   Procedure:  1. Right thoracotomy  2. Drainage of empyema  3. Decortication of the Right lung   Anesthesia: General Endotracheal   Clinical History/Surgical Indication:   The patient is a 70 year old woman who had an AVR with a tissue valve at [facility] through a right anterior thoracotomy incision in 2012. She has done well until about a month ago when she started having some shortness of breath and though her heart failure was returning. Over the past week she has had subjective fever, nonproductive cough, shortness of breath and a lot of pain in the right side of her chest. She is an avid golfer and was on a golf trip earlier this week but says she could not even play the last couple days. She presented to [facility] and a CT showed a large loculated right pleural effusion with compressive atelectasis of the right lung. A thoracentesis was performed yesterday removing 60 cc of grossly purulent fluid and gram stain showed gram + cocci. I was called this am by Dr. Willey Blade and the patient was transferred for surgical drainage.  I have personally reviewed and interpreted her CT scan and echo. She has a large right empyema yielding frank pus on thoracentesis with gram + cocci. 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 Her LV function is normal. This should be drained today to minimize the chance of bacteremia and infection of her valve. I discussed the surgery with her including alternatives, benefits and risks including but not limited tobleeding, blood transfusion, infection, prolonged air leak from the lung and respiratory failure.   Judith Schroeder understands and agrees to proceed.   Preparation:  The patient was seen in the preoperative holding area and the correct patient,  correct operation, correct operative sidewere confirmed with the patient after reviewing the medical record and CT scan. The consent was signed by me. Preoperative antibiotics were given. The right side of the chest was signed by me. The patient was taken back to the operating room and positioned supine on the operating room table. After being placed under general endotracheal anesthesia by the anesthesia team using a single lumen tube and a bronchial blocker. The double lumen tube would not stay in position. A foley catheter was placed. The patient was turned into the left lateral decubitus position. The chest was prepped with betadine soap and solution. A surgical time-out was taken and the correct patient,operative side, and operative procedure were confirmed with the nursing and anesthesia staff.   Operative Procedure:  A short lateral muscle-sparing thoracotomy incision was made and the chest entered through the 7th ICS. The pleural space was filled with a yellowish green multiloculated empyema that was sero-purulent with a thick fibrinous peel over the lung. There was a collection of frank pus posteriorly that appeared to be eroding into the chest wall. Cultures of the empyema fluid and the fibrinous peel were sent to micro. The empyema was completely drained and the right lung decorticated. This allowed complete expansion of the lung. The chest was irrigated with warm saline. Hemostasis was complete. Three 50 F Bard drains were placed through separate stab incisions and were positioned posteriorly,anteriorly, and in the costophrenic recess in the pleural space. The ribs were reapproximated with # 2 vicryl pericostal sutures.  The muscles were returned to their normal position. The subcutaneous tissue was closed with 2-0 vicryl continuous suture. The skin was closed with staples.  All sponge, needle, and instrument counts were reported correct at the end of the case. Dry sterile dressings were placed over  the incisions and around the chest tubes which were connected to pleurevac suction. The patient was turned supine, extubated,then transported to the PACU in satisfactory and stable condition.

## 2016-09-07 NOTE — Anesthesia Procedure Notes (Signed)
Procedure Name: Intubation Date/Time: 09/07/2016 3:14 PM Performed by: Marinda Elk A Pre-anesthesia Checklist: Patient identified, Emergency Drugs available, Suction available, Patient being monitored and Timeout performed Patient Re-evaluated:Patient Re-evaluated prior to inductionOxygen Delivery Method: Circle system utilized Preoxygenation: Pre-oxygenation with 100% oxygen Intubation Type: IV induction Ventilation: Mask ventilation without difficulty Laryngoscope Size: Mac, 3 and Glidescope Grade View: Grade II Tube type: Oral Endobronchial tube: Bronchial Blocker placed under direct vision Tube size: 8.0 mm Number of attempts: 4 Airway Equipment and Method: Stylet,  Video-laryngoscopy and Fiberoptic brochoscope Placement Confirmation: ETT inserted through vocal cords under direct vision,  positive ETCO2 and breath sounds checked- equal and bilateral Secured at: 21 cm Dental Injury: Bloody posterior oropharynx  Difficulty Due To: Difficulty was unanticipated Comments: Induction, then laryngoscopy with Mac 3, 69fr lt endobronch tube placed, esophageal, removed immed.  Laryngoscopy with video glide scope, 8.0 single lumen ETT placed, then exchanged over tube changer for 35 fr. Lt endobronchial tube.  Unable to advance tube into lt. Mainstem bronchus even with fiberoptic scope.   Tube removed, replaced with 8.0 single  Lumen ETT and bronchial blocker passed with good isolation.

## 2016-09-07 NOTE — Transfer of Care (Signed)
Immediate Anesthesia Transfer of Care Note  Patient: Judith Schroeder  Procedure(s) Performed: Procedure(s): VIDEO ASSISTED THORACOSCOPY (VATS)/EMPYEMA (Right)  Patient Location: PACU  Anesthesia Type:General  Level of Consciousness: awake  Airway & Oxygen Therapy: Patient Spontanous Breathing and Patient connected to nasal cannula oxygen  Post-op Assessment: Report given to RN and Post -op Vital signs reviewed and stable  Post vital signs: Reviewed and stable  Last Vitals:  Vitals:   09/07/16 0541 09/07/16 1110  BP: (!) 142/67 (!) 154/59  Pulse: 86 82  Resp: 20 16  Temp: 36.8 C 36.5 C    Last Pain:  Vitals:   09/07/16 1110  TempSrc: Oral  PainSc:          Complications: No apparent anesthesia complications

## 2016-09-08 ENCOUNTER — Inpatient Hospital Stay (HOSPITAL_COMMUNITY): Payer: Medicare Other

## 2016-09-08 LAB — CBC
HCT: 32 % — ABNORMAL LOW (ref 36.0–46.0)
Hemoglobin: 9.9 g/dL — ABNORMAL LOW (ref 12.0–15.0)
MCH: 29.7 pg (ref 26.0–34.0)
MCHC: 30.9 g/dL (ref 30.0–36.0)
MCV: 96.1 fL (ref 78.0–100.0)
PLATELETS: 468 10*3/uL — AB (ref 150–400)
RBC: 3.33 MIL/uL — AB (ref 3.87–5.11)
RDW: 13.1 % (ref 11.5–15.5)
WBC: 17.5 10*3/uL — AB (ref 4.0–10.5)

## 2016-09-08 LAB — BASIC METABOLIC PANEL
Anion gap: 7 (ref 5–15)
BUN: 10 mg/dL (ref 6–20)
CALCIUM: 8 mg/dL — AB (ref 8.9–10.3)
CHLORIDE: 101 mmol/L (ref 101–111)
CO2: 29 mmol/L (ref 22–32)
CREATININE: 0.76 mg/dL (ref 0.44–1.00)
GFR calc Af Amer: >60 mL/min (ref >60)
GFR calc non Af Amer: >60 mL/min (ref >60)
Glucose, Bld: 209 mg/dL — ABNORMAL HIGH (ref 65–99)
Potassium: 4.4 mmol/L (ref 3.5–5.1)
Sodium: 137 mmol/L (ref 135–145)

## 2016-09-08 LAB — BLOOD GAS, ARTERIAL
ACID-BASE EXCESS: 4.6 mmol/L — AB (ref 0.0–2.0)
BICARBONATE: 28.4 mmol/L — AB (ref 20.0–28.0)
DRAWN BY: 437071
O2 CONTENT: 6 L/min
O2 SAT: 96.6 %
PCO2 ART: 40.2 mmHg (ref 32.0–48.0)
PH ART: 7.461 — AB (ref 7.350–7.450)
Patient temperature: 97.7
pO2, Arterial: 78.6 mmHg — ABNORMAL LOW (ref 83.0–108.0)

## 2016-09-08 LAB — GLUCOSE, CAPILLARY
GLUCOSE-CAPILLARY: 188 mg/dL — AB (ref 65–99)
GLUCOSE-CAPILLARY: 155 mg/dL — AB (ref 65–99)
Glucose-Capillary: 228 mg/dL — ABNORMAL HIGH (ref 65–99)
Glucose-Capillary: 148 mg/dL — ABNORMAL HIGH (ref 65–99)

## 2016-09-08 MED ORDER — ORAL CARE MOUTH RINSE
15.0000 mL | Freq: Two times a day (BID) | OROMUCOSAL | Status: DC
  Administered 2016-09-08: 15 mL via OROMUCOSAL

## 2016-09-08 MED ORDER — PIPERACILLIN-TAZOBACTAM 3.375 G IVPB
3.3750 g | Freq: Three times a day (TID) | INTRAVENOUS | Status: DC
  Administered 2016-09-08 – 2016-09-10 (×6): 3.375 g via INTRAVENOUS
  Filled 2016-09-08 (×8): qty 50

## 2016-09-08 MED ORDER — LEVALBUTEROL HCL 0.63 MG/3ML IN NEBU
0.6300 mg | INHALATION_SOLUTION | Freq: Two times a day (BID) | RESPIRATORY_TRACT | Status: DC
  Administered 2016-09-09 – 2016-09-11 (×5): 0.63 mg via RESPIRATORY_TRACT
  Filled 2016-09-08 (×5): qty 3

## 2016-09-08 MED ORDER — VANCOMYCIN HCL IN DEXTROSE 750-5 MG/150ML-% IV SOLN
750.0000 mg | Freq: Two times a day (BID) | INTRAVENOUS | Status: DC
  Administered 2016-09-08 – 2016-09-10 (×4): 750 mg via INTRAVENOUS
  Filled 2016-09-08 (×5): qty 150

## 2016-09-08 NOTE — Progress Notes (Signed)
Patient ID: Judith Schroeder, female   DOB: 07-May-1946, 70 y.o.   MRN: [id number]   SICU Evening Rounds:   Hemodynamically stable    Urine output good  CT output low  CBC    Component Value Date/Time   WBC 17.5 (H) 09/08/2016 0410   RBC 3.33 (L) 09/08/2016 0410   HGB 9.9 (L) 09/08/2016 0410   HCT 32.0 (L) 09/08/2016 0410   PLT 468 (H) 09/08/2016 0410   MCV 96.1 09/08/2016 0410   MCH 29.7 09/08/2016 0410   MCHC 30.9 09/08/2016 0410   RDW 13.1 09/08/2016 0410     BMET    Component Value Date/Time   NA 137 09/08/2016 0410   K 4.4 09/08/2016 0410   CL 101 09/08/2016 0410   CO2 29 09/08/2016 0410   GLUCOSE 209 (H) 09/08/2016 0410   BUN 10 09/08/2016 0410   CREATININE 0.76 09/08/2016 0410   CALCIUM 8.0 (L) 09/08/2016 0410   GFRNONAA >60 09/08/2016 0410   GFRAA >60 09/08/2016 0410     A/P:  Stable postop course. Continue current plans

## 2016-09-08 NOTE — Progress Notes (Signed)
MD aware of low DBP/MAP.

## 2016-09-08 NOTE — Plan of Care (Signed)
Problem: Activity: Goal: Risk for activity intolerance will decrease Outcome: Progressing Pt dangled x2 and stood at bedside.  Problem: Cardiac: Goal: Hemodynamic stability will improve Outcome: Progressing WNL  Problem: Respiratory: Goal: Pain level will decrease with appropriate interventions Outcome: Progressing Pt pain controlled on PCA

## 2016-09-08 NOTE — Progress Notes (Signed)
1 Day Post-Op Procedure(s) (LRB): VIDEO ASSISTED THORACOSCOPY (VATS)/EMPYEMA (Right) Subjective:  No complaints. Pain is well-controlled.  Objective: Vital signs in last 24 hours: Temp:  [97.2 F (36.2 C)-98.6 F (37 C)] 98.3 F (36.8 C) (09/24 0800) Pulse Rate:  [74-91] 84 (09/24 0900) Cardiac Rhythm: Normal sinus rhythm (09/24 0800) Resp:  [11-41] 21 (09/24 0900) BP: (92-154)/(42-78) 93/56 (09/24 0900) SpO2:  [90 %-100 %] 94 % (09/24 0900) Arterial Line BP: (147)/(56) 147/56 (09/23 1723) Weight:  [102.3 kg (225 lb 8.5 oz)] 102.3 kg (225 lb 8.5 oz) (09/24 0530)  Hemodynamic parameters for last 24 hours:    Intake/Output from previous day: 09/23 0701 - 09/24 0700 In: 2488.3 [I.V.:2488.3] Out: 1255 [Urine:815; Blood:150; Chest Tube:290] Intake/Output this shift: Total I/O In: 800 [P.O.:600; I.V.:200] Out: 50 [Urine:50]  General appearance: alert and cooperative Neurologic: intact Heart: regular rate and rhythm, S1, S2 normal, no murmur, click, rub or gallop Lungs: decreased at right base. Extremities: extremities normal, atraumatic, no cyanosis or edema Wound: dressing dry Chest tube output low, thin serosanguinous.  Lab Results:  Recent Labs  09/07/16 1900 09/08/16 0410  WBC 19.5* 17.5*  HGB 11.1* 9.9*  HCT 34.9* 32.0*  PLT 507* 468*   BMET:  Recent Labs  09/07/16 0553 09/08/16 0410  NA 135 137  K 3.6 4.4  CL 98* 101  CO2 30 29  GLUCOSE 157* 209*  BUN 13 10  CREATININE 0.84 0.76  CALCIUM 8.0* 8.0*    PT/INR:  Recent Labs  09/05/16 1711  LABPROT 15.0  INR 1.17   ABG    Component Value Date/Time   PHART 7.461 (H) 09/08/2016 0305   HCO3 28.4 (H) 09/08/2016 0305   TCO2 34 09/07/2016 1850   O2SAT 96.6 09/08/2016 0305   CBG (last 3)   Recent Labs  09/07/16 2347 09/08/16 0418 09/08/16 0839  GLUCAP 210* 188* 155*   CLINICAL DATA:  Status post right VATS.  EXAM: PORTABLE CHEST 1 VIEW  COMPARISON:  Yesterday.  FINDINGS: Right  jugular catheter tip in the superior vena cava. 3 right chest tubes remain in place with a small amount of subcutaneous emphysema at the location of skin clips. No pneumothorax. The cardiac silhouette remains borderline enlarged with a prosthetic aortic valve noted. Minimally increased left basilar atelectasis. No significant change in right lung airspace opacity and pleural fluid. Thoracic spine degenerative changes.  IMPRESSION: 1. Decreased inspiration with mildly increased left basilar atelectasis. 2. No significant change in right pleural fluid and right basilar atelectasis.   Electronically Signed   By: Claudie Revering M.D.   On: 09/08/2016 08:22  Assessment/Plan:  S/P right thoracotomy and drainage of empyema  POD 1  She is doing well.  Gram stain of pleural peel shows G+ cocci. Cultures pending Will continue vanc and Zosyn pending cultures. Keep chest tubes in today Work on IS, ambulation  LOS: 3 days    Gaye Pollack 09/08/2016

## 2016-09-08 NOTE — Progress Notes (Signed)
Pharmacy Antibiotic Note  Judith Schroeder is a 70 y.o. female admitted on 09/05/2016 with pneumonia, now with GPC in pleural fluid. Pharmacy has been consulted for vancomycin and zosyn dosing - day #3 with brief interruption 9/23 PM - 9/24 PM, antibiotics were inadvertently d/c'd post-op on 9/23. Patient is s/p VATS. Confirmed with Dr. Cyndia Bent, continue until cultures result. Afebrile, wbc trend down to 17.5, SCr stable wnl.  Plan: Vancomycin 750mg  IV every 12 hours. Goal trough 15-20 Zosyn 3.375g IV q8h (4 hour infusion). F/U cultures, renal function, clinical progress, LOT/de-escalation plan Vancomycin levels as indicated  Height: 5\' 3"  (160 cm) Weight: 225 lb 8.5 oz (102.3 kg) IBW/kg (Calculated) : 52.4  Temp (24hrs), Avg:97.9 F (36.6 C), Min:97.2 F (36.2 C), Max:98.6 F (37 C)   Recent Labs Lab 09/05/16 1711 09/06/16 0503 09/07/16 0553 09/07/16 1900 09/08/16 0410  WBC 17.2* 15.6*  --  19.5* 17.5*  CREATININE 0.88 0.79 0.84  --  0.76    Estimated Creatinine Clearance: 75.9 mL/min (by C-G formula based on SCr of 0.76 mg/dL).    Allergies  Allergen Reactions  . Atorvastatin Other (See Comments)    Muscle pain    Antimicrobials this admission: Vancomyin 9/22 >>  Zosyn 9/22 >>   Dose adjustments this admission: n/a  Microbiology results: 9/21 MRSA PCR: negative 9/22 pleural fluid: GPC (S-pending) 9/23 R pleural fluid tissue:  9/23 R pleural fluid: ngtd   Elicia Lamp, PharmD, [facility] Clinical Pharmacist Pager [phone] 09/08/2016 12:51 PM

## 2016-09-09 ENCOUNTER — Inpatient Hospital Stay (HOSPITAL_COMMUNITY): Payer: Medicare Other

## 2016-09-09 ENCOUNTER — Encounter (HOSPITAL_COMMUNITY): Payer: Self-pay | Admitting: Surgery

## 2016-09-09 LAB — COMPREHENSIVE METABOLIC PANEL
ALK PHOS: 72 U/L (ref 38–126)
ALT: 26 U/L (ref 14–54)
ANION GAP: 6 (ref 5–15)
AST: 22 U/L (ref 15–41)
Albumin: 1.6 g/dL — ABNORMAL LOW (ref 3.5–5.0)
BILIRUBIN TOTAL: 0.5 mg/dL (ref 0.3–1.2)
BUN: 9 mg/dL (ref 6–20)
CALCIUM: 7.9 mg/dL — AB (ref 8.9–10.3)
CO2: 28 mmol/L (ref 22–32)
CREATININE: 0.85 mg/dL (ref 0.44–1.00)
Chloride: 103 mmol/L (ref 101–111)
Glucose, Bld: 118 mg/dL — ABNORMAL HIGH (ref 65–99)
Potassium: 3.7 mmol/L (ref 3.5–5.1)
Sodium: 137 mmol/L (ref 135–145)
TOTAL PROTEIN: 5.3 g/dL — AB (ref 6.5–8.1)

## 2016-09-09 LAB — GLUCOSE, CAPILLARY
GLUCOSE-CAPILLARY: 127 mg/dL — AB (ref 65–99)
GLUCOSE-CAPILLARY: 148 mg/dL — AB (ref 65–99)
GLUCOSE-CAPILLARY: 208 mg/dL — AB (ref 65–99)
Glucose-Capillary: 99 mg/dL (ref 65–99)
Glucose-Capillary: 122 mg/dL — ABNORMAL HIGH (ref 65–99)

## 2016-09-09 LAB — CBC
HCT: 30.4 % — ABNORMAL LOW (ref 36.0–46.0)
HEMOGLOBIN: 9.4 g/dL — AB (ref 12.0–15.0)
MCH: 30.1 pg (ref 26.0–34.0)
MCHC: 30.9 g/dL (ref 30.0–36.0)
MCV: 97.4 fL (ref 78.0–100.0)
Platelets: 454 10*3/uL — ABNORMAL HIGH (ref 150–400)
RBC: 3.12 MIL/uL — AB (ref 3.87–5.11)
RDW: 13.5 % (ref 11.5–15.5)
WBC: 13.9 10*3/uL — ABNORMAL HIGH (ref 4.0–10.5)

## 2016-09-09 LAB — CULTURE, BODY FLUID-BOTTLE

## 2016-09-09 LAB — CULTURE, BODY FLUID W GRAM STAIN -BOTTLE

## 2016-09-09 MED ORDER — FUROSEMIDE 10 MG/ML IJ SOLN
40.0000 mg | Freq: Once | INTRAMUSCULAR | Status: AC
  Administered 2016-09-09: 40 mg via INTRAVENOUS
  Filled 2016-09-09: qty 4

## 2016-09-09 NOTE — Care Management Important Message (Signed)
Important Message  Patient Details  Name: Judith Schroeder MRN: [id number] Date of Birth: 05/05/1946   Medicare Important Message Given:  Yes    Nathen May 09/09/2016, 10:56 AM

## 2016-09-09 NOTE — Progress Notes (Signed)
2 Days Post-Op Procedure(s) (LRB): VIDEO ASSISTED THORACOSCOPY (VATS)/EMPYEMA (Right) Subjective: No complaints, pain under good control.  Objective: Vital signs in last 24 hours: Temp:  [97.4 F (36.3 C)-98.3 F (36.8 C)] 97.9 F (36.6 C) (09/25 0400) Pulse Rate:  [75-93] 80 (09/25 0700) Cardiac Rhythm: Normal sinus rhythm (09/25 0600) Resp:  [17-34] 34 (09/25 0700) BP: (93-132)/(42-64) 123/56 (09/25 0700) SpO2:  [92 %-97 %] 94 % (09/25 0724) FiO2 (%):  [21 %] 21 % (09/24 1953)  Hemodynamic parameters for last 24 hours:    Intake/Output from previous day: 09/24 0701 - 09/25 0700 In: 2460 [P.O.:660; I.V.:1350; IV Piggyback:450] Out: 1010 [Urine:890; Chest Tube:120] Intake/Output this shift: No intake/output data recorded.  General appearance: alert and cooperative Heart: regular rate and rhythm, S1, S2 normal, no murmur, click, rub or gallop Lungs: diminished breath sounds right base Extremities: edema mild  Wound: dressing dry chest tube output thin and serosanguinous  Lab Results:  Recent Labs  09/08/16 0410 09/09/16 0427  WBC 17.5* 13.9*  HGB 9.9* 9.4*  HCT 32.0* 30.4*  PLT 468* 454*   BMET:  Recent Labs  09/08/16 0410 09/09/16 0427  NA 137 137  K 4.4 3.7  CL 101 103  CO2 29 28  GLUCOSE 209* 118*  BUN 10 9  CREATININE 0.76 0.85  CALCIUM 8.0* 7.9*    PT/INR: No results for input(s): LABPROT, INR in the last 72 hours. ABG    Component Value Date/Time   PHART 7.461 (H) 09/08/2016 0305   HCO3 28.4 (H) 09/08/2016 0305   TCO2 34 09/07/2016 1850   O2SAT 96.6 09/08/2016 0305   CBG (last 3)   Recent Labs  09/08/16 2029 09/09/16 0014 09/09/16 0348  GLUCAP 148* 127* 99   CLINICAL DATA:  Chest tube treatment of right-sided empyema.  EXAM: PORTABLE CHEST 1 VIEW  COMPARISON:  Portable chest x-ray of September 24th 2017  FINDINGS: The left lung is adequately inflated. There is persistent left basilar atelectasis. On the right the lung is  better aerated. There is decreased pleural thickening -pleural fluid. The chest tubes are in stable position. There remain some subcutaneous air in the right axillary region. There is no pneumothorax. There is an azygos lobe anatomy on the right. The right internal jugular venous catheter tip projects over the midportion of the SVC. The cardiac silhouette is top-normal in size but stable.  IMPRESSION: Slight interval improvement in the pleural thickening -pleural fluid on the right. Stable positioning of the chest tubes.  Persistent left basilar atelectasis or infiltrate.   Electronically Signed   By: David  Martinique M.D.   On: 09/09/2016 07:41  Assessment/Plan: S/P Procedure(s) (LRB): VIDEO ASSISTED THORACOSCOPY (VATS)/EMPYEMA (Right)  She is doing well. Chest tube output low. Will remove middle tube today.  Cultures still pending ID. Few gram + cocci in pairs. Continue vanc and Zosyn pending ID and sens.  Continue IS and ambulation  Diurese   DC foley today.   LOS: 4 days    Gaye Pollack 09/09/2016

## 2016-09-09 NOTE — Progress Notes (Signed)
CT surgery p.m. Rounds  Patient examined and record reviewed.Hemodynamics stable,labs satisfactory.Patient had stable day.Continue current care. Judith Schroeder 09/09/2016

## 2016-09-10 ENCOUNTER — Inpatient Hospital Stay (HOSPITAL_COMMUNITY): Payer: Medicare Other

## 2016-09-10 MED ORDER — DEXTROSE 5 % IV SOLN
2.0000 g | INTRAVENOUS | Status: DC
  Administered 2016-09-10 – 2016-09-11 (×2): 2 g via INTRAVENOUS
  Filled 2016-09-10 (×3): qty 2

## 2016-09-10 NOTE — Progress Notes (Signed)
Patient ID: Judith Schroeder, female   DOB: January 18, 1946, 70 y.o.   MRN: [id number] EVENING ROUNDS NOTE :     [address]       [place] [address]             [phone]                 3 Days Post-Op Procedure(s) (LRB): VIDEO ASSISTED THORACOSCOPY (VATS)/EMPYEMA (Right)  Total Length of Stay:  LOS: 5 days  BP (!) 146/66   Pulse 82   Temp 98.2 F (36.8 C) (Oral)   Resp (!) 30   Ht 5\' 3"  (1.6 m)   Wt 222 lb 10.6 oz (101 kg)   SpO2 94%   BMI 39.44 kg/m   .Intake/Output      09/25 0701 - 09/26 0700 09/26 0701 - 09/27 0700   P.O. 660    I.V. (mL/kg) 178.7 (1.8) 90 (0.9)   IV Piggyback 450 50   Total Intake(mL/kg) 1288.7 (12.8) 140 (1.4)   Urine (mL/kg/hr) 3130 (1.3) 300 (0.3)   Stool 1 (0)    Chest Tube 50 (0) 30 (0)   Total Output 3181 330   Net -1892.3 -190        Stool Occurrence 2 x      . dextrose 5 % and 0.9 % NaCl with KCl 20 mEq/L 10 mL/hr at 09/10/16 0800     Lab Results  Component Value Date   WBC 13.9 (H) 09/09/2016   HGB 9.4 (L) 09/09/2016   HCT 30.4 (L) 09/09/2016   PLT 454 (H) 09/09/2016   GLUCOSE 118 (H) 09/09/2016   ALT 26 09/09/2016   AST 22 09/09/2016   NA 137 09/09/2016   K 3.7 09/09/2016   CL 103 09/09/2016   CREATININE 0.85 09/09/2016   BUN 9 09/09/2016   CO2 28 09/09/2016   TSH 1.740 11/23/2014   INR 1.17 09/05/2016   Stable day   Grace Isaac MD  Beeper [phone] Office [phone] 09/10/2016 6:40 PM

## 2016-09-10 NOTE — Progress Notes (Signed)
3 Days Post-Op Procedure(s) (LRB): VIDEO ASSISTED THORACOSCOPY (VATS)/EMPYEMA (Right) Subjective:  No complaints. Anxious to go home.  Objective: Vital signs in last 24 hours: Temp:  [97.9 F (36.6 C)-98.5 F (36.9 C)] 98.4 F (36.9 C) (09/26 0752) Pulse Rate:  [87-98] 88 (09/26 0700) Cardiac Rhythm: Normal sinus rhythm (09/26 0400) Resp:  [18-33] 29 (09/26 0730) BP: (66-155)/(31-67) 134/67 (09/26 0700) SpO2:  [90 %-96 %] 93 % (09/26 0846) Weight:  [101 kg (222 lb 10.6 oz)] 101 kg (222 lb 10.6 oz) (09/26 0500)  Hemodynamic parameters for last 24 hours:    Intake/Output from previous day: 09/25 0701 - 09/26 0700 In: 1288.7 [P.O.:660; I.V.:178.7; IV Piggyback:450] Out: [id number] [Urine:3130; Stool:1; Chest Tube:50] Intake/Output this shift: Total I/O In: -  Out: 20 [Chest Tube:20]  General appearance: alert and cooperative Heart: regular rate and rhythm, S1, S2 normal, no murmur, click, rub or gallop Lungs: diminished breath sounds RLL Wound: dressing dry chest tube output low.  Lab Results:  Recent Labs  09/08/16 0410 09/09/16 0427  WBC 17.5* 13.9*  HGB 9.9* 9.4*  HCT 32.0* 30.4*  PLT 468* 454*   BMET:  Recent Labs  09/08/16 0410 09/09/16 0427  NA 137 137  K 4.4 3.7  CL 101 103  CO2 29 28  GLUCOSE 209* 118*  BUN 10 9  CREATININE 0.76 0.85  CALCIUM 8.0* 7.9*    PT/INR: No results for input(s): LABPROT, INR in the last 72 hours. ABG    Component Value Date/Time   PHART 7.461 (H) 09/08/2016 0305   HCO3 28.4 (H) 09/08/2016 0305   TCO2 34 09/07/2016 1850   O2SAT 96.6 09/08/2016 0305   CBG (last 3)   Recent Labs  09/09/16 0855 09/09/16 1148 09/09/16 1538  GLUCAP 122* 148* 208*   CLINICAL DATA:  Right thoracotomy for drainage of right empyema with decortication of the right lung  EXAM: PORTABLE CHEST 1 VIEW  COMPARISON:  Portable chest x-ray of 09/09/2016  FINDINGS: The lungs remain poorly aerated with patchy opacities particularly at  the lung bases consistent with atelectasis and possible postop change. Opacity at the right lung base most likely is postoperative with right chest tube present. No pneumothorax is seen. Right central venous line tip overlies the mid lower SVC. Heart size is stable.  IMPRESSION: Poor aeration with bibasilar opacities most likely due to postoperative atelectasis and postoperative change at the right lung base with right chest tube remaining. No pneumothorax.   Electronically Signed   By: Ivar Drape M.D.   On: 09/10/2016 08:30  Assessment/Plan:  Doing well POD 3 s/p right thoracotomy and drainage of empyema. Will remove posterior chest tube today.  Cultures grew microaerophilic strep so will change antibiotic to Rocephin and plan home on Augmentin.  Continue IS and ambulation  LOS: 5 days    Gaye Pollack 09/10/2016

## 2016-09-10 NOTE — Care Management Note (Addendum)
Case Management Note  Patient Details  Name: Judith Schroeder MRN: [id number] Date of Birth: Aug 18, 1946  Original CM note completed by Lidia Collum [place] 09/06/16 Subjective/Objective:  Patient adm from home with pleural effusion. She is ind with ADL's. Daughter present at bedside during assessment. Patient has a PCP and insurance, reports no issues.                  Action/Plan: Anticipate will DC home with self care. Will follow for further needs. Pending thoracentesis today.  Expected Discharge Date:         09/07/2016         Expected Discharge Plan:  Home/Self Care  In-House Referral:  NA  Discharge planning Services  CM Consult  Post Acute Care Choice:  NA Choice offered to:  NA  DME Arranged:    DME Agency:     HH Arranged:    HH Agency:     Status of Service:  In process, will continue to follow  If discussed at Long Length of Stay Meetings, dates discussed:    Additional Comments: 09/10/2016  Elenor Quinones, RN, BSN [phone] Pt transferred to [facility] for surgery consideration.  Pt is now s/p VATS.  CM will continue to follow for discharge needs Maryclare Labrador, RN 09/10/2016, 11:45 AM

## 2016-09-11 ENCOUNTER — Inpatient Hospital Stay (HOSPITAL_COMMUNITY): Payer: Medicare Other

## 2016-09-11 LAB — BODY FLUID CULTURE: CULTURE: NO GROWTH

## 2016-09-11 NOTE — Progress Notes (Signed)
Report called to 2W, spoke with Adrianne RN. Have spoken with Blain Pais, case manager, she will follow up with the daughter today concerning discharge tomorrow.

## 2016-09-11 NOTE — Progress Notes (Addendum)
PCA discontinued. Wasted 34ml with Psychologist, prison and probation services. Wasted in pixis.   Removed chest tube, no complications. Removed IJ, no complications.

## 2016-09-11 NOTE — Progress Notes (Signed)
4 Days Post-Op Procedure(s) (LRB): VIDEO ASSISTED THORACOSCOPY (VATS)/EMPYEMA (Right) Subjective: No complaints  Objective: Vital signs in last 24 hours: Temp:  [97.6 F (36.4 C)-98.4 F (36.9 C)] 97.6 F (36.4 C) (09/27 0743) Pulse Rate:  [72-94] 72 (09/27 0400) Cardiac Rhythm: Normal sinus rhythm (09/27 0400) Resp:  [12-95] 17 (09/27 0743) BP: (126-159)/(55-97) 129/59 (09/27 0400) SpO2:  [91 %-96 %] 96 % (09/27 0743)  Hemodynamic parameters for last 24 hours:    Intake/Output from previous day: 09/26 0701 - 09/27 0700 In: 280 [I.V.:230; IV Piggyback:50] Out: 1280 [Urine:1250; Chest Tube:30] Intake/Output this shift: No intake/output data recorded.  General appearance: alert and cooperative Heart: regular rate and rhythm, S1, S2 normal, no murmur, click, rub or gallop Lungs: diminished breath sounds RLL Wound: dressing dry minimal chest tube output  Lab Results:  Recent Labs  09/09/16 0427  WBC 13.9*  HGB 9.4*  HCT 30.4*  PLT 454*   BMET:  Recent Labs  09/09/16 0427  NA 137  K 3.7  CL 103  CO2 28  GLUCOSE 118*  BUN 9  CREATININE 0.85  CALCIUM 7.9*    PT/INR: No results for input(s): LABPROT, INR in the last 72 hours. ABG    Component Value Date/Time   PHART 7.461 (H) 09/08/2016 0305   HCO3 28.4 (H) 09/08/2016 0305   TCO2 34 09/07/2016 1850   O2SAT 96.6 09/08/2016 0305   CBG (last 3)   Recent Labs  09/09/16 0855 09/09/16 1148 09/09/16 1538  GLUCAP 122* 148* 208*    Assessment/Plan:  POD 4 s/p right thoracotomy for drainage of empyema after CAP  Cultures grew microaerophilic strep. Will continue Rocephin here and send home on Augmentin for a week  Remove remaining chest tube today  Transfer to 2W and continue mobilization and IS  Plan home tomorrow. She will need staple and chest tube removal 1 week after discharge in the office and I will need to see her in 3 weeks with a CXR.  LOS: 6 days    Gaye Pollack 09/11/2016

## 2016-09-11 NOTE — Progress Notes (Signed)
Pt states she anticipates discharging back to her home - states she does not want to go home with dtr but that dtr plans to be available tomorrow for her discharge.  Pt states she normally has multiple animals she feeds @ her home and across the street - future son-in-law will be managing this until she fully recovers. TC to dtr, [name] [phone]) per pt's request.  [name] has concerns about signs of dementia that she has noted during the last several months, noted that pt had been started on Aricept.  CM answered questions surrounding community resources, legal and financial issues.  Dtr was very appreciative and plans to follow up.

## 2016-09-11 NOTE — Evaluation (Signed)
Physical Therapy Evaluation Patient Details Name: DENIA STORTZ MRN: [id number] DOB: 1946-09-07 Today's Date: 09/11/2016   History of Present Illness  The patient is a 70 year old woman who had an AVR with a tissue valve at [facility] through a right anterior thoracotomy incision in 2012. She has done well until about a month ago when she started having some shortness of breath and though her heart failure was returning. Over the past week she has had subjective fever, nonproductive cough, shortness of breath and a lot of pain in the right side of her chest.Had large right empyema with gram +cocci, underwent VATS on 09/07/16.  Clinical Impression  Patient evaluated by Physical Therapy with no further acute PT needs identified. All education has been completed and the patient has no further questions. Pt independent with mobility, encouraged to ambulate in hall 3-5x/ day and relayed to RN.  See below for any follow-up Physical Therapy or equipment needs. PT is signing off. Thank you for this referral.     Follow Up Recommendations No PT follow up    Equipment Recommendations  None recommended by PT    Recommendations for Other Services       Precautions / Restrictions Restrictions Weight Bearing Restrictions: No      Mobility  Bed Mobility               General bed mobility comments: pt up to chair independently  Transfers Overall transfer level: Independent               General transfer comment: no difficulty with transfers  Ambulation/Gait Ambulation/Gait assistance: Modified independent (Device/Increase time) Ambulation Distance (Feet): 500 Feet Assistive device: None Gait Pattern/deviations: WFL(Within Functional Limits) Gait velocity: decreased Gait velocity interpretation: Below normal speed for age/gender General Gait Details: pt ambulated with slightly decreased pace but no LOB or gross defictis noted. Rest stops taken as needed, 2x  Stairs Stairs:  Yes Stairs assistance: Modified independent (Device/Increase time) Stair Management: One rail Right;Forwards;Alternating pattern Number of Stairs: 4 General stair comments: safe on stairs, HR up to 100 bpm, 1 min to recover  Wheelchair Mobility    Modified Rankin (Stroke Patients Only)       Balance Overall balance assessment: No apparent balance deficits (not formally assessed)                                           Pertinent Vitals/Pain Pain Assessment: No/denies pain         HR standing 100 bpm, 110 after stairs, 96 bpm after rest  Home Living Family/patient expects to be discharged to:: Private residence Living Arrangements: Alone Available Help at Discharge: Family;Available PRN/intermittently Type of Home: House Home Access: Stairs to enter Entrance Stairs-Rails: None Entrance Stairs-Number of Steps: 2 Home Layout: Multi-level;Able to live on main level with bedroom/bathroom Home Equipment: None Additional Comments: pt is an avid golfer (4x/ week) as well as taking care of dog, cat, and horse. Daughter lives nearby and is supportive    Prior Function Level of Independence: Independent               Hand Dominance        Extremity/Trunk Assessment   Upper Extremity Assessment: Overall WFL for tasks assessed           Lower Extremity Assessment: Overall WFL for tasks assessed  Cervical / Trunk Assessment: Normal  Communication   Communication: No difficulties  Cognition Arousal/Alertness: Awake/alert Behavior During Therapy: WFL for tasks assessed/performed Overall Cognitive Status: Within Functional Limits for tasks assessed                      General Comments General comments (skin integrity, edema, etc.): discussed activity progression at home    Exercises     Assessment/Plan    PT Assessment Patent does not need any further PT services  PT Problem List            PT Treatment Interventions       PT Goals (Current goals can be found in the Care Plan section)  Acute Rehab PT Goals Patient Stated Goal: return home and to golf PT Goal Formulation: All assessment and education complete, DC therapy    Frequency     Barriers to discharge        Co-evaluation               End of Session   Activity Tolerance: Patient tolerated treatment well Patient left: Other (comment) (bathroom) Nurse Communication: Mobility status         Time: [id number] PT Time Calculation (min) (ACUTE ONLY): 21 min   Charges:   PT Evaluation $PT Eval Moderate Complexity: 1 Procedure     PT G Codes:      Leighton Roach, PT  Acute Rehab Services  [place], [place] 09/11/2016, 4:13 PM

## 2016-09-12 ENCOUNTER — Inpatient Hospital Stay (HOSPITAL_COMMUNITY): Payer: Medicare Other

## 2016-09-12 ENCOUNTER — Other Ambulatory Visit: Payer: Self-pay | Admitting: Physician Assistant

## 2016-09-12 LAB — PH, BODY FLUID: pH, Body Fluid: 5.8

## 2016-09-12 LAB — AEROBIC/ANAEROBIC CULTURE W GRAM STAIN (SURGICAL/DEEP WOUND)

## 2016-09-12 LAB — ANAEROBIC CULTURE

## 2016-09-12 LAB — AEROBIC/ANAEROBIC CULTURE (SURGICAL/DEEP WOUND)

## 2016-09-12 MED ORDER — OXYCODONE HCL 5 MG PO TABS: 5.0000 mg | ORAL_TABLET | ORAL | 0 refills | Status: DC | PRN

## 2016-09-12 MED ORDER — ACETAMINOPHEN 500 MG PO TABS: 1000.0000 mg | ORAL_TABLET | Freq: Four times a day (QID) | ORAL | 0 refills | Status: DC | PRN

## 2016-09-12 NOTE — Discharge Summary (Signed)
Physician Discharge Summary  Patient ID: Judith Schroeder MRN: [id number] DOB/AGE: 08-12-1946 70 y.o.  Admit date: 09/05/2016 Discharge date: 09/12/2016  Admission Diagnoses:  Patient Active Problem List   Diagnosis Date Noted  . Empyema ([place]) 09/07/2016  . Pleural effusion 09/05/2016  . Pleural effusion on right 09/05/2016  . MCI (mild cognitive impairment) with memory loss 07/31/2016  . Cognitive changes 11/23/2014  . Fatigue 11/23/2014  . Cold feeling 11/23/2014   Discharge Diagnoses:   Patient Active Problem List   Diagnosis Date Noted  . Empyema ([place]) 09/07/2016  . Pleural effusion 09/05/2016  . Pleural effusion on right 09/05/2016  . MCI (mild cognitive impairment) with memory loss 07/31/2016  . Cognitive changes 11/23/2014  . Fatigue 11/23/2014  . Cold feeling 11/23/2014   Discharged Condition: good  History of Present Illness:  is a 70 year old woman who had an AVR with a tissue valve at [facility] through a right anterior thoracotomy incision in 2012. She has done well until about a month ago when she started having some shortness of breath and though her heart failure was returning. Over the past week she has had subjective fever, nonproductive cough, shortness of breath and a lot of pain in the right side of her chest. She is an avid golfer and was on a golf trip earlier this week but says she could not even play the last couple days. She presented to [facility] and a CT showed a large loculated right pleural effusion with compressive atelectasis of the right lung. A thoracentesis was performed yesterday removing 60 cc of grossly purulent fluid and gram stain showed gram + cocci.  It was felt drainage of a empyema would be necessary and the patient was transferred to [facility] Course:   Upon arrival the patient was evaluated by Dr. Cyndia Bent.  He felt the patient should go to the OR for VATS procedure with drainage of empyema.  Being that she has a valve  prosthesis in place that was free from vegetations per ECHO, this should be done immediately.  She was taken to the operating room on 09/07/2016.  She underwent Right Thoracotomy with drainage of empyema and decortication of the right lung.  She the procedure without difficulty, was extubated, and taken to the SICU in stable condition.  The patient progressed without difficulty post operatively.  Her anterior chest tube was removed on  POD #2.  Her final chest tube was removed on POD #3.  Cultures obtained in the OR revealed the presence of Microaerophilic strep.  She was treated with Rocephin during her hospitalization and transitioned to oral Augmentin at discharge.  Follow up CXR was obtained and showed no evidence of pneumothorax and stable post surgical changes.  She was ambulating independently and her pain was well controlled.  She was tolerating a diet and felt medically stable for discharge home today.      Significant Diagnostic Studies:   MODERATE MICROAEROPHILIC STREPTOCOCCI NO ANAEROBES ISOLATED;   Treatments: surgery:   1. Right thoracotomy  2. Drainage of empyema  3. Decortication of the Right lung   Disposition: 01-Home or Self Care   Discharge medications:     Medication List    TAKE these medications   acetaminophen 500 MG tablet Commonly known as:  TYLENOL Take 2 tablets (1,000 mg total) by mouth every 6 (six) hours as needed for mild pain or fever.   aspirin EC 325 MG tablet Take 325 mg by mouth  daily.   donepezil 10 MG tablet Commonly known as:  ARICEPT Take 1 tablet (10 mg total) by mouth at bedtime.   eplerenone 25 MG tablet Commonly known as:  INSPRA Take 25 mg by mouth daily.   furosemide 20 MG tablet Commonly known as:  LASIX Take 20 mg by mouth daily.   metoprolol succinate 25 MG 24 hr tablet Commonly known as:  TOPROL-XL Take 25 mg by mouth daily.   oxyCODONE 5 MG immediate release tablet Commonly known as:  Oxy IR/ROXICODONE Take 1-2 tablets  (5-10 mg total) by mouth every 4 (four) hours as needed for severe pain.   rosuvastatin 5 MG tablet Commonly known as:  CRESTOR Take 5 mg by mouth daily.      Augmentin 875-125 take 1 tablet by mouth every 12 hours for 7 days  Follow-up Information    Gaye Pollack, MD Follow up in 3 week(s).   Specialty:  Cardiothoracic Surgery Why:  Office will contact you with appointment.  Please get CXR at [facility] imaging located on the first floor of our office building 30 min prior to your appointment  Contact information: [address] [place] [address] [phone]        [facility] Follow up in 1 week(s).   Specialty:  Cardiothoracic Surgery Why:  For staple, suture removal with nurse... office will contact you with appointment Contact information: [place], [place] [phone]          Signed: Ellwood Handler 09/12/2016, 12:16 PM

## 2016-09-12 NOTE — Progress Notes (Signed)
      [address]       [place] [address]             [phone]      5 Days Post-Op Procedure(s) (LRB): VIDEO ASSISTED THORACOSCOPY (VATS)/EMPYEMA (Right)   Subjective:  Ms. Baselice has no complaints.  States she feels pretty good and is ready to go home.  Objective: Vital signs in last 24 hours: Temp:  [97.5 F (36.4 C)-98.4 F (36.9 C)] 97.5 F (36.4 C) (09/28 0531) Pulse Rate:  [81-102] 81 (09/28 0531) Cardiac Rhythm: Normal sinus rhythm (09/27 1938) Resp:  [14-29] 18 (09/27 2051) BP: (153-165)/(63-67) 153/63 (09/28 0531) SpO2:  [93 %-96 %] 95 % (09/27 2051) Weight:  [218 lb 14.7 oz (99.3 kg)] 218 lb 14.7 oz (99.3 kg) (09/28 0531)  Intake/Output from previous day: 09/27 0701 - 09/28 0700 In: 380 [P.O.:360; I.V.:20] Out: 1200 [Urine:1200]  General appearance: alert, cooperative and no distress Heart: regular rate and rhythm Lungs: diminished breath sounds bibasilar Abdomen: soft, non-tender; bowel sounds normal; no masses,  no organomegaly Extremities: extremities normal, atraumatic, no cyanosis or edema Wound: clean and dry, staples in tact  Lab Results: No results for input(s): WBC, HGB, HCT, PLT in the last 72 hours. BMET: No results for input(s): NA, K, CL, CO2, GLUCOSE, BUN, CREATININE, CALCIUM in the last 72 hours.  PT/INR: No results for input(s): LABPROT, INR in the last 72 hours. ABG    Component Value Date/Time   PHART 7.461 (H) 09/08/2016 0305   HCO3 28.4 (H) 09/08/2016 0305   TCO2 34 09/07/2016 1850   O2SAT 96.6 09/08/2016 0305   CBG (last 3)   Recent Labs  09/09/16 0855 09/09/16 1148 09/09/16 1538  GLUCAP 122* 148* 208*    Assessment/Plan: S/P Procedure(s) (LRB): VIDEO ASSISTED THORACOSCOPY (VATS)/EMPYEMA (Right)  1. Empyema- chest tube removed yesterday, no pneumothorax, stable post surgical changes 2. CV- HTN- will resume home Toprol at discharge 3. ID- on IV Rocephin, will change to Augmentin for discharge 4.  Dispo- patient stable, will d/c home today   LOS: 7 days    Ahmed Prima, ERIN 09/12/2016

## 2016-09-12 NOTE — Care Management Note (Signed)
Case Management Note Per Maryclare Labrador, RN 09/10/2016, 11:45 AM  Patient Details  Name: Judith Schroeder MRN: [id number] Date of Birth: [birth year]  Original CM note completed by Lidia Collum Hunnicutt 09/06/16 Subjective/Objective:  Patient adm from home with pleural effusion. She is ind with ADL's. Daughter present at bedside during assessment. Patient has a PCP and insurance, reports no issues.                  Action/Plan: Anticipate will DC home with self care. Will follow for further needs. Pending thoracentesis today.  Expected Discharge Date:      09/12/16            Expected Discharge Plan:  Home/Self Care  In-House Referral:  NA  Discharge planning Services  CM Consult  Post Acute Care Choice:  NA Choice offered to:  NA  DME Arranged:    DME Agency:     HH Arranged:    HH Agency:     Status of Service:  Completed, signed off  If discussed at [employer] of Stay Meetings, dates discussed:    Additional Comments:  09/12/16- 0950- Marvetta Gibbons RN, CM- pt for d/c home today, no CM needs noted.    Maryclare Labrador, RN 09/10/2016, 11:45 [phone]--Pt transferred to [facility] for surgery consideration.  Pt is now s/p VATS.  CM will continue to follow for discharge needs   Dawayne Patricia, RN 09/12/2016, 9:51 AM [phone]

## 2016-09-16 DIAGNOSIS — I10 Essential (primary) hypertension: Secondary | ICD-10-CM | POA: Diagnosis not present

## 2016-09-16 DIAGNOSIS — I359 Nonrheumatic aortic valve disorder, unspecified: Secondary | ICD-10-CM | POA: Diagnosis not present

## 2016-09-16 DIAGNOSIS — Z23 Encounter for immunization: Secondary | ICD-10-CM | POA: Diagnosis not present

## 2016-09-16 DIAGNOSIS — E782 Mixed hyperlipidemia: Secondary | ICD-10-CM | POA: Diagnosis not present

## 2016-09-16 DIAGNOSIS — J869 Pyothorax without fistula: Secondary | ICD-10-CM | POA: Diagnosis not present

## 2016-09-19 ENCOUNTER — Encounter (INDEPENDENT_AMBULATORY_CARE_PROVIDER_SITE_OTHER): Payer: Self-pay

## 2016-09-19 DIAGNOSIS — Z9889 Other specified postprocedural states: Secondary | ICD-10-CM

## 2016-09-19 DIAGNOSIS — Z4802 Encounter for removal of sutures: Secondary | ICD-10-CM

## 2016-09-19 NOTE — Anesthesia Postprocedure Evaluation (Signed)
Anesthesia Post Note  Patient: Judith Schroeder  Procedure(s) Performed: Procedure(s) (LRB): VIDEO ASSISTED THORACOSCOPY (VATS)/EMPYEMA (Right)  Patient location during evaluation: PACU Anesthesia Type: General Level of consciousness: awake, awake and alert and oriented Pain management: pain level controlled Vital Signs Assessment: post-procedure vital signs reviewed and stable Respiratory status: spontaneous breathing and respiratory function stable Cardiovascular status: blood pressure returned to baseline    Last Vitals:  Vitals:   09/11/16 2051 09/12/16 0531  BP: (!) 165/64 (!) 153/63  Pulse: 88 81  Resp: 18   Temp: 36.8 C 36.4 C    Last Pain:  Vitals:   09/12/16 0531  TempSrc: Oral  PainSc:                  [name]

## 2016-10-01 ENCOUNTER — Other Ambulatory Visit: Payer: Self-pay | Admitting: Surgery

## 2016-10-01 DIAGNOSIS — K859 Acute pancreatitis without necrosis or infection, unspecified: Principal | ICD-10-CM

## 2016-10-01 DIAGNOSIS — J918 Pleural effusion in other conditions classified elsewhere: Secondary | ICD-10-CM

## 2016-10-01 DIAGNOSIS — J90 Pleural effusion, not elsewhere classified: Secondary | ICD-10-CM

## 2016-10-02 ENCOUNTER — Ambulatory Visit (INDEPENDENT_AMBULATORY_CARE_PROVIDER_SITE_OTHER): Payer: Self-pay | Admitting: Surgery

## 2016-10-02 ENCOUNTER — Ambulatory Visit
Admission: RE | Admit: 2016-10-02 | Discharge: 2016-10-02 | Disposition: A | Discharge status: Home / Self Care | Payer: Medicare Other | Source: Ambulatory Visit | Attending: Surgery | Admitting: Surgery

## 2016-10-02 ENCOUNTER — Encounter: Payer: Self-pay | Admitting: Surgery

## 2016-10-02 VITALS — BP 139/80 | HR 73 | Resp 20 | Ht 63.0 in | Wt 209.0 lb

## 2016-10-02 DIAGNOSIS — J9811 Atelectasis: Secondary | ICD-10-CM | POA: Diagnosis not present

## 2016-10-02 DIAGNOSIS — J90 Pleural effusion, not elsewhere classified: Secondary | ICD-10-CM

## 2016-10-02 DIAGNOSIS — J869 Pyothorax without fistula: Secondary | ICD-10-CM

## 2016-10-02 DIAGNOSIS — Z9889 Other specified postprocedural states: Secondary | ICD-10-CM

## 2016-10-05 ENCOUNTER — Encounter: Payer: Self-pay | Admitting: Surgery

## 2016-10-05 NOTE — Progress Notes (Signed)
     HPI: Patient returns for routine postoperative follow-up having undergone right thoracotomy and drainage of an empyema on 09/07/2016. The patient's early postoperative recovery while in the hospital was notable for an uncomplicated postop course. Since hospital discharge the patient reports that she has been feeling well. She is anxious to return to playing golf.   Current Outpatient Prescriptions  Medication Sig Dispense Refill  . acetaminophen (TYLENOL) 500 MG tablet Take 2 tablets (1,000 mg total) by mouth every 6 (six) hours as needed for mild pain or fever. 30 tablet 0  . aspirin EC 325 MG tablet Take 325 mg by mouth daily.    Marland Kitchen donepezil (ARICEPT) 10 MG tablet Take 1 tablet (10 mg total) by mouth at bedtime. 30 tablet 12  . eplerenone (INSPRA) 25 MG tablet Take 25 mg by mouth daily.    . furosemide (LASIX) 20 MG tablet Take 20 mg by mouth daily.     . metoprolol succinate (TOPROL-XL) 25 MG 24 hr tablet Take 25 mg by mouth daily.    . rosuvastatin (CRESTOR) 5 MG tablet Take 5 mg by mouth daily.     No current facility-administered medications for this visit.     Physical Exam: BP 139/80 (BP Location: Left Arm, Patient Position: Sitting, Cuff Size: Large)   Pulse 73   Resp 20   Ht 5\' 3"  (1.6 m)   Wt 209 lb (94.8 kg)   SpO2 98% Comment: RA  BMI 37.02 kg/m  She looks well. Lung exam is clear. Cardiac exam shows a regular rate and rhythm with normal heart sounds. Chest incision is healing well    Diagnostic Tests:  CLINICAL DATA:  Pleural effusion status post VATS for empyema. History of aortic valve replacement 2012. Nonsmoker.  EXAM: CHEST  2 VIEW  COMPARISON:  Chest radiographs dating back through 09/05/2016, chest CT 09/05/2016  FINDINGS: The cardiac silhouette is top-normal in size. The patient is status post aortic valvular replacement. Skin staples have been removed from the right thoracic soft tissues. Faint minimal residual opacity at the right  lung base laterally, best seen on the frontal view possibly representing some fluid in the major fissure. No significant recurrence of pleural fluid. Azygos lobe of incidental note. Atelectasis at the each lung base I in pneumo with and lingula.  IMPRESSION: Small faint opacity peripherally at the right lung base may correspond with a small amount of fluid in the right major fissure. Minimal bibasilar atelectasis.   Electronically Signed   By: Ashley Royalty M.D.   On: 10/02/2016 16:02   Impression:  Overall she is doing well following her surgery. She remains active and has no shortness of breath. I told her that she can return to driving but should not play golf or do any strenuous physical activity or lifting more than 10 lbs for 8 weeks postop.  Plan:  She will continue to follow up with Dr. Willey Blade and will contact me if she has any problems with her incisions.   Gaye Pollack, MD [facility] [phone]

## 2016-12-16 DIAGNOSIS — F0390 Unspecified dementia without behavioral disturbance: Secondary | ICD-10-CM

## 2016-12-16 HISTORY — DX: Unspecified dementia, unspecified severity, without behavioral disturbance, psychotic disturbance, mood disturbance, and anxiety: F03.90

## 2016-12-23 DIAGNOSIS — Z23 Encounter for immunization: Secondary | ICD-10-CM | POA: Diagnosis not present

## 2016-12-23 DIAGNOSIS — R0602 Shortness of breath: Secondary | ICD-10-CM | POA: Diagnosis not present

## 2016-12-23 DIAGNOSIS — E782 Mixed hyperlipidemia: Secondary | ICD-10-CM | POA: Diagnosis not present

## 2016-12-23 DIAGNOSIS — I359 Nonrheumatic aortic valve disorder, unspecified: Secondary | ICD-10-CM | POA: Diagnosis not present

## 2016-12-23 DIAGNOSIS — N179 Acute kidney failure, unspecified: Secondary | ICD-10-CM | POA: Diagnosis not present

## 2016-12-23 DIAGNOSIS — I10 Essential (primary) hypertension: Secondary | ICD-10-CM | POA: Diagnosis not present

## 2017-02-04 ENCOUNTER — Telehealth: Payer: Self-pay | Admitting: Neurology

## 2017-02-04 ENCOUNTER — Ambulatory Visit (INDEPENDENT_AMBULATORY_CARE_PROVIDER_SITE_OTHER): Payer: Medicare Other | Admitting: Neurology

## 2017-02-04 ENCOUNTER — Encounter: Payer: Self-pay | Admitting: Neurology

## 2017-02-04 VITALS — BP 133/70 | HR 68 | Ht 63.0 in | Wt 214.2 lb

## 2017-02-04 DIAGNOSIS — F0390 Unspecified dementia without behavioral disturbance: Secondary | ICD-10-CM

## 2017-02-04 DIAGNOSIS — R4189 Other symptoms and signs involving cognitive functions and awareness: Secondary | ICD-10-CM

## 2017-02-04 DIAGNOSIS — G3184 Mild cognitive impairment, so stated: Secondary | ICD-10-CM | POA: Diagnosis not present

## 2017-02-04 DIAGNOSIS — R413 Other amnesia: Secondary | ICD-10-CM

## 2017-02-04 DIAGNOSIS — E538 Deficiency of other specified B group vitamins: Secondary | ICD-10-CM | POA: Diagnosis not present

## 2017-02-04 NOTE — Patient Instructions (Signed)
Remember to drink plenty of fluid, eat healthy meals and do not skip any meals. Try to eat protein with a every meal and eat a healthy snack such as fruit or nuts in between meals. Try to keep a regular sleep-wake schedule and try to exercise daily, particularly in the form of walking, 20-30 minutes a day, if you can.   As far as your medications are concerned, I would like to suggest: Can increase Aricept  As far as diagnostic testing: FDG PET Scan for evaluation of Dementia  I would like to see you back in 6-9 months, sooner if we need to. Please call us with any interim questions, concerns, problems, updates or refill requests.   Our phone number is [phone]. We also have an after hours call service for urgent matters and there is a physician on-call for urgent questions. For any emergencies you know to call 911 or go to the nearest emergency room

## 2017-02-04 NOTE — Telephone Encounter (Signed)
Judith Schroeder, I order a PET scan for this patient, an FDG Metabolic PET Scan for Dementia/Alzheimers. We had some issues with confusion about the order in the past. Can you ensure I placed the order correctly and they understand this is for dementia? Thanks!

## 2017-02-04 NOTE — Progress Notes (Signed)
[id number]    Provider:  Dr Jaynee Eagles Referring Provider: [name], MD Primary Care Physician:  [name], MD  CC: Memory loss  Interval history 02/04/2017: Memory changes are stable. She is better since last appointment. Daughter is here and she is handling her medications and refills and making sure that is filled, she is eating better, she is making more lists and using calendars. She has been on top of things but facts of day to day activities have been worse, sister with stage 4 cancer but she kept saying she didn't know sister had cancer, stories she tells may not be true. She is trying to be more social. Depression still may be playing a part. Discussed FDG PET scan as a diagnostic measure and our   Interval history 07/30/2016: Patient here for followup of memory loss. Had neurocognitive testing with [facility] in 2016. She did perform poorly but diagnostic possibilities included cognitive dysfunction associated with depression, he noted that her cognitive difficulties first manifested coincidently with changes in her mood and behavior suggestive of depression, that her cognitive difficulties had been occurring on an intermittent basis and that she reported subjectively improved cognitive functioning around the time she hired caregivers for her mother by reducing her stress level. However given the findings it was concerning for cerebral dysfunction. Patient was advised to follow up for depression and then come back to clinic for further evaluation   Patient returns today. She feels her depression is improved, she is playing golf, being social. Still her memory continues to decline. She has a significant FHx of dementia. Sister has dementia. Father has dementia. Daughter is here who provides information, says patient tells the same story multiple times. Confused. She can "zone out". She will say things that are random. Especially the last 6 months significant decline. Daughter  says her depression is not worse, better despite the memory changes.    [id number] S Wilsonis a 71 y.o.femalehere as a follow up.  Patient here for follow up of memory loss. She was evaluated by Dr. Valentina Shaggy and this is likely due to stress and depression. Talked about the results, diagnosis, stressed life changes, therapy, psychiatry, treatment for depression. She is making changes in her life. She is caring for her mother, she has a lot of stress.   b12 and folate nml tsh nml Unremarkable MRI of the brain  Reviewed notes, labs and imaging from outside physicians, which showed: per preliminary report by Dr. Valentina Shaggy, At this point, it is likely that her attention and memory functioning are being disrupted by ongoing stress and depression although an incipient neurodegenerative process cannot be excluded. In support of the role of mood factors was the report that her cognitive difficulties first manifested coincident with changes in her mood and behavior suggestive of depression; and that she reported subjectively improved cognitive functioning around the time she hired caregivers for her mother thereby reducing her level of life stress. In contrast, the extent of her memory deficit on neurocognitive testing in context of her consistent alertness and attentiveness, relaxed demeanor and apparent steady effort was troubling.  It is recommended that she be treated for depression with antidepressant medication, if medically appropriate. She agreed. Psychological counseling should be deferred for the time being given that she was not overly interested in pursuing counseling at this point. If her cognitive difficulties should persist after improvement in mood, then a repeat neuropsychological evaluation should be considered, perhaps in one year.   11/23/2014 initiial visit: [name]  is a 71 y.o. female here as a referral from Dr. Willey Blade for cognitive changes. PMHx of HTN, HLD, Aotic valve  disorder s/p replacement. This past summer she noticed that at times ahe would get distracted and her mind would wander. For example, even playing golf she would get distracted and think of a tax return. She is under a lot of stress, mother is 7 and calling her every day. She loses things, was going to bring a folder today and couldn't find it. She is forgetting appointments, she has to keep them documented. Lives alone. Independent. She pays bills on time, she keeps good records. She is paying her mother's bills. She can't spell, this is chronic. She is losing more recent memory, her memory "goes for a split second" and she has to think hard to remember what she was going into a room to do or where she put something. She thinks about something else and her mind switches over. No confusion or lapses of time or episodes of altered consciousness. She has episodes where she has a cold feeling over her body and she shivers. Memory problems "come and go". Not getting worse. Just happening more often. Father with parkinson's disease. Father with dementia. Fatigued, some days she wants to sleep all days and other days it is fine. She sleeps well. Doesn't know if she snores. She takes her fluid pills at night and gets up at night to urinate but doesn't bother her. She can't take fluid pilld during the day or she wets her pants, she can't urinate a lot like when she is playing golf. She is still social, plays golf. No focal neurologic symptoms.    Reviewed notes, labs and imaging from outside physicians, which showed: HgbA1c 5.6 cbc/cmp unremarkable  Review of Systems: Patient complains of symptoms per HPI as well as the following symptoms: swelling in legs, easy bruising, confusion, memory loss, feeling cold. Pertinent negatives per HPI. All others negative.    Social History   Social History  . Marital status: Widowed    Spouse name: N/A  . Number of children: 1  . Years of education: 12+    Occupational History  . Retired    Social History Main Topics  . Smoking status: Never Smoker  . Smokeless tobacco: Never Used  . Alcohol use No  . Drug use: No  . Sexual activity: Not on file   Other Topics Concern  . Not on file   Social History Narrative   Patient lives at home alone.    Patient is widowed.    Patient has 1 child   Patient has 2 years of college   Patient is right handed    Caffeine use: Drinks 2 cups coffee per day   2-3 glasses tea per day   No soda        Family History  Problem Relation Age of Onset  . Hypertension Mother   . Cancer Father     head and neck     Past Medical History:  Diagnosis Date  . Carpal tunnel syndrome   . H/O aortic valve replacement   . High blood pressure   . High cholesterol   . Pleural effusion     Past Surgical History:  Procedure Laterality Date  . AORTIC VALVE REPLACEMENT  2012  . EMPYEMA DRAINAGE  2017  . TUBAL LIGATION    . VIDEO ASSISTED THORACOSCOPY (VATS)/EMPYEMA Right 09/07/2016   Procedure: VIDEO ASSISTED THORACOSCOPY (VATS)/EMPYEMA;  Surgeon: Fernande Boyden  Cyndia Bent, MD;  Location: [facility] OR;  Service: Thoracic;  Laterality: Right;    Current Outpatient Prescriptions  Medication Sig Dispense Refill  . acetaminophen (TYLENOL) 500 MG tablet Take 2 tablets (1,000 mg total) by mouth every 6 (six) hours as needed for mild pain or fever. 30 tablet 0  . aspirin EC 81 MG tablet Take by mouth.    . donepezil (ARICEPT) 10 MG tablet Take 1 tablet (10 mg total) by mouth at bedtime. 30 tablet 12  . eplerenone (INSPRA) 25 MG tablet Take 25 mg by mouth daily.    . metoprolol succinate (TOPROL-XL) 25 MG 24 hr tablet Take 25 mg by mouth daily.    . rosuvastatin (CRESTOR) 5 MG tablet Take 5 mg by mouth daily.     No current facility-administered medications for this visit.     Allergies as of 02/04/2017 - Review Complete 02/04/2017  Allergen Reaction Noted  . Atorvastatin Other (See Comments) 09/11/2015     Vitals: BP 133/70   Pulse 68   Ht 5\' 3"  (1.6 m)   Wt 214 lb 3.2 oz (97.2 kg)   BMI 37.94 kg/m  Last Weight:  Wt Readings from Last 1 Encounters:  02/04/17 214 lb 3.2 oz (97.2 kg)   Last Height:   Ht Readings from Last 1 Encounters:  02/04/17 5\' 3"  (1.6 m)   MMSE - Mini Mental State Exam 02/04/2017 07/30/2016  Orientation to time 1 2  Orientation to Place 3 3  Registration 3 3  Attention/ Calculation 4 5  Recall 2 1  Language- name 2 objects 2 2  Language- repeat 1 1  Language- follow 3 step command 3 3  Language- read & follow direction 1 1  Write a sentence 1 1  Copy design 1 1  Total score 22 23      Assessment/Plan: ZAILEE ARA is a 71 y.o. female here as a referral from Dr. Willey Blade for cognitive changes. PMHx of HTN, HLD, Aortic valve disorder s/p replacement. Here with her daughter. Memory is declining despite resolution of depression and stress which likely points to a neurocognitive degenerative disorder especially given her FHx of dementia and sister who has significant dementia at this point. Had a discussion with patient and daughter who understood.   b12 and folate nml tsh nml Unremarkable MRI of the brain Will order FDG PET scan  Can consider increassing aricept to 2x a day. Consider repeat Neurocognitive testing   Cc: Dr. Cecille Aver, MD  [facility] [address] [place], [place] [address]  Phone [phone] Fax [phone]  A total of 30 minutes was spent face-to-face with this patient. Over half this time was spent on counseling patient on the [facility] possibly dementia diagnosis and different diagnostic and therapeutic options available.

## 2017-02-05 ENCOUNTER — Telehealth: Payer: Self-pay

## 2017-02-05 NOTE — Telephone Encounter (Signed)
Dr. Jaynee Eagles Order prefect patient is scheduled for 02/14/2017. Thanks Hinton Dyer.

## 2017-02-05 NOTE — Telephone Encounter (Signed)
Called dtr w/ unremarkable lab results. Verbalized understanding and appreciation for call.

## 2017-02-05 NOTE — Telephone Encounter (Signed)
-----   Message from Melvenia Beam, MD sent at 02/05/2017 11:48 AM EST ----- Labs unremarkable thanks

## 2017-02-07 ENCOUNTER — Telehealth: Payer: Self-pay

## 2017-02-07 LAB — CBC
Hematocrit: 43.4 % (ref 34.0–46.6)
Hemoglobin: 14.7 g/dL (ref 11.1–15.9)
MCH: 31.2 pg (ref 26.6–33.0)
MCHC: 33.9 g/dL (ref 31.5–35.7)
MCV: 92 fL (ref 79–97)
PLATELETS: 204 10*3/uL (ref 150–379)
RBC: 4.71 x10E6/uL (ref 3.77–5.28)
RDW: 13.9 % (ref 12.3–15.4)
WBC: 7.1 10*3/uL (ref 3.4–10.8)

## 2017-02-07 LAB — COMPREHENSIVE METABOLIC PANEL
A/G RATIO: 1.9 (ref 1.2–2.2)
ALK PHOS: 101 IU/L (ref 39–117)
ALT: 16 IU/L (ref 0–32)
AST: 20 IU/L (ref 0–40)
Albumin: 4.4 g/dL (ref 3.5–4.8)
BILIRUBIN TOTAL: 2.7 mg/dL — AB (ref 0.0–1.2)
BUN/Creatinine Ratio: 15 (ref 12–28)
BUN: 14 mg/dL (ref 8–27)
CHLORIDE: 102 mmol/L (ref 96–106)
CO2: 20 mmol/L (ref 18–29)
Calcium: 9.6 mg/dL (ref 8.7–10.3)
Creatinine, Ser: 0.93 mg/dL (ref 0.57–1.00)
GFR calc Af Amer: 72 (ref >59)
GFR calc non Af Amer: 62 (ref >59)
GLOBULIN, TOTAL: 2.3 (ref 1.5–4.5)
Glucose: 103 mg/dL — ABNORMAL HIGH (ref 65–99)
Potassium: 4.5 mmol/L (ref 3.5–5.2)
SODIUM: 143 mmol/L (ref 134–144)
Total Protein: 6.7 g/dL (ref 6.0–8.5)

## 2017-02-07 LAB — B12 AND FOLATE PANEL
FOLATE: 8 ng/mL (ref >3.0)
Vitamin B-12: 454 pg/mL (ref 232–1245)

## 2017-02-07 LAB — METHYLMALONIC ACID, SERUM: METHYLMALONIC ACID: 164 nmol/L (ref 0–378)

## 2017-02-07 NOTE — Telephone Encounter (Signed)
I called pt, spoke to Ambrose Finland, pt's daughter, per DPR. I advised her that pt's labs were unremarkable. Pt's daughter verbalized understanding.

## 2017-02-07 NOTE — Telephone Encounter (Signed)
-----   Message from Melvenia Beam, MD sent at 02/07/2017  9:46 AM EST ----- Labs unremarkable thanks

## 2017-02-14 ENCOUNTER — Ambulatory Visit (HOSPITAL_COMMUNITY)
Admission: RE | Admit: 2017-02-14 | Discharge: 2017-02-14 | Disposition: A | Discharge status: Home / Self Care | Payer: Medicare Other | Source: Ambulatory Visit | Attending: Neurology | Admitting: Neurology

## 2017-02-14 DIAGNOSIS — G3184 Mild cognitive impairment, so stated: Secondary | ICD-10-CM | POA: Diagnosis not present

## 2017-02-14 DIAGNOSIS — F0280 Dementia in other diseases classified elsewhere without behavioral disturbance: Secondary | ICD-10-CM | POA: Insufficient documentation

## 2017-02-14 DIAGNOSIS — G319 Degenerative disease of nervous system, unspecified: Secondary | ICD-10-CM | POA: Diagnosis not present

## 2017-02-14 DIAGNOSIS — R4189 Other symptoms and signs involving cognitive functions and awareness: Secondary | ICD-10-CM | POA: Diagnosis not present

## 2017-02-14 DIAGNOSIS — R413 Other amnesia: Secondary | ICD-10-CM

## 2017-02-14 MED ORDER — FLUDEOXYGLUCOSE F - 18 (FDG) INJECTION
10.0000 | Freq: Once | INTRAVENOUS | Status: AC | PRN
  Administered 2017-02-14: 10 via INTRAVENOUS

## 2017-02-20 ENCOUNTER — Telehealth: Payer: Self-pay | Admitting: *Deleted

## 2017-02-20 NOTE — Telephone Encounter (Signed)
Per Dr Jaynee Eagles, LVM for patient's daughter, [name] informing her that her mothers brain scan results are normal however, this doesn't entirely rule out dementia. Left number for any questions.

## 2017-02-26 ENCOUNTER — Encounter: Payer: Self-pay | Admitting: Neurology

## 2017-03-02 ENCOUNTER — Other Ambulatory Visit: Payer: Self-pay | Admitting: Neurology

## 2017-03-02 DIAGNOSIS — F0390 Unspecified dementia without behavioral disturbance: Secondary | ICD-10-CM

## 2017-03-03 ENCOUNTER — Encounter: Payer: Self-pay | Admitting: Psychology

## 2017-04-08 ENCOUNTER — Ambulatory Visit (INDEPENDENT_AMBULATORY_CARE_PROVIDER_SITE_OTHER): Payer: Medicare Other | Admitting: Psychology

## 2017-04-08 DIAGNOSIS — R4189 Other symptoms and signs involving cognitive functions and awareness: Secondary | ICD-10-CM | POA: Diagnosis not present

## 2017-04-08 DIAGNOSIS — F339 Major depressive disorder, recurrent, unspecified: Secondary | ICD-10-CM

## 2017-04-08 DIAGNOSIS — R4689 Other symptoms and signs involving appearance and behavior: Secondary | ICD-10-CM
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
## 2017-04-09 ENCOUNTER — Encounter: Payer: Self-pay | Admitting: Psychology

## 2017-04-15 IMAGING — CR DG CHEST 2V
2 series · 2 of 2 positions shown · non-contrast
Comparison: Chest radiographs dating back through 09/05/2016, chest
CT 09/05/2016

CLINICAL DATA: Pleural effusion status post VATS for empyema.
History of aortic valve replacement 3053. Nonsmoker.

EXAM:
CHEST  2 VIEW

[w chest pa]
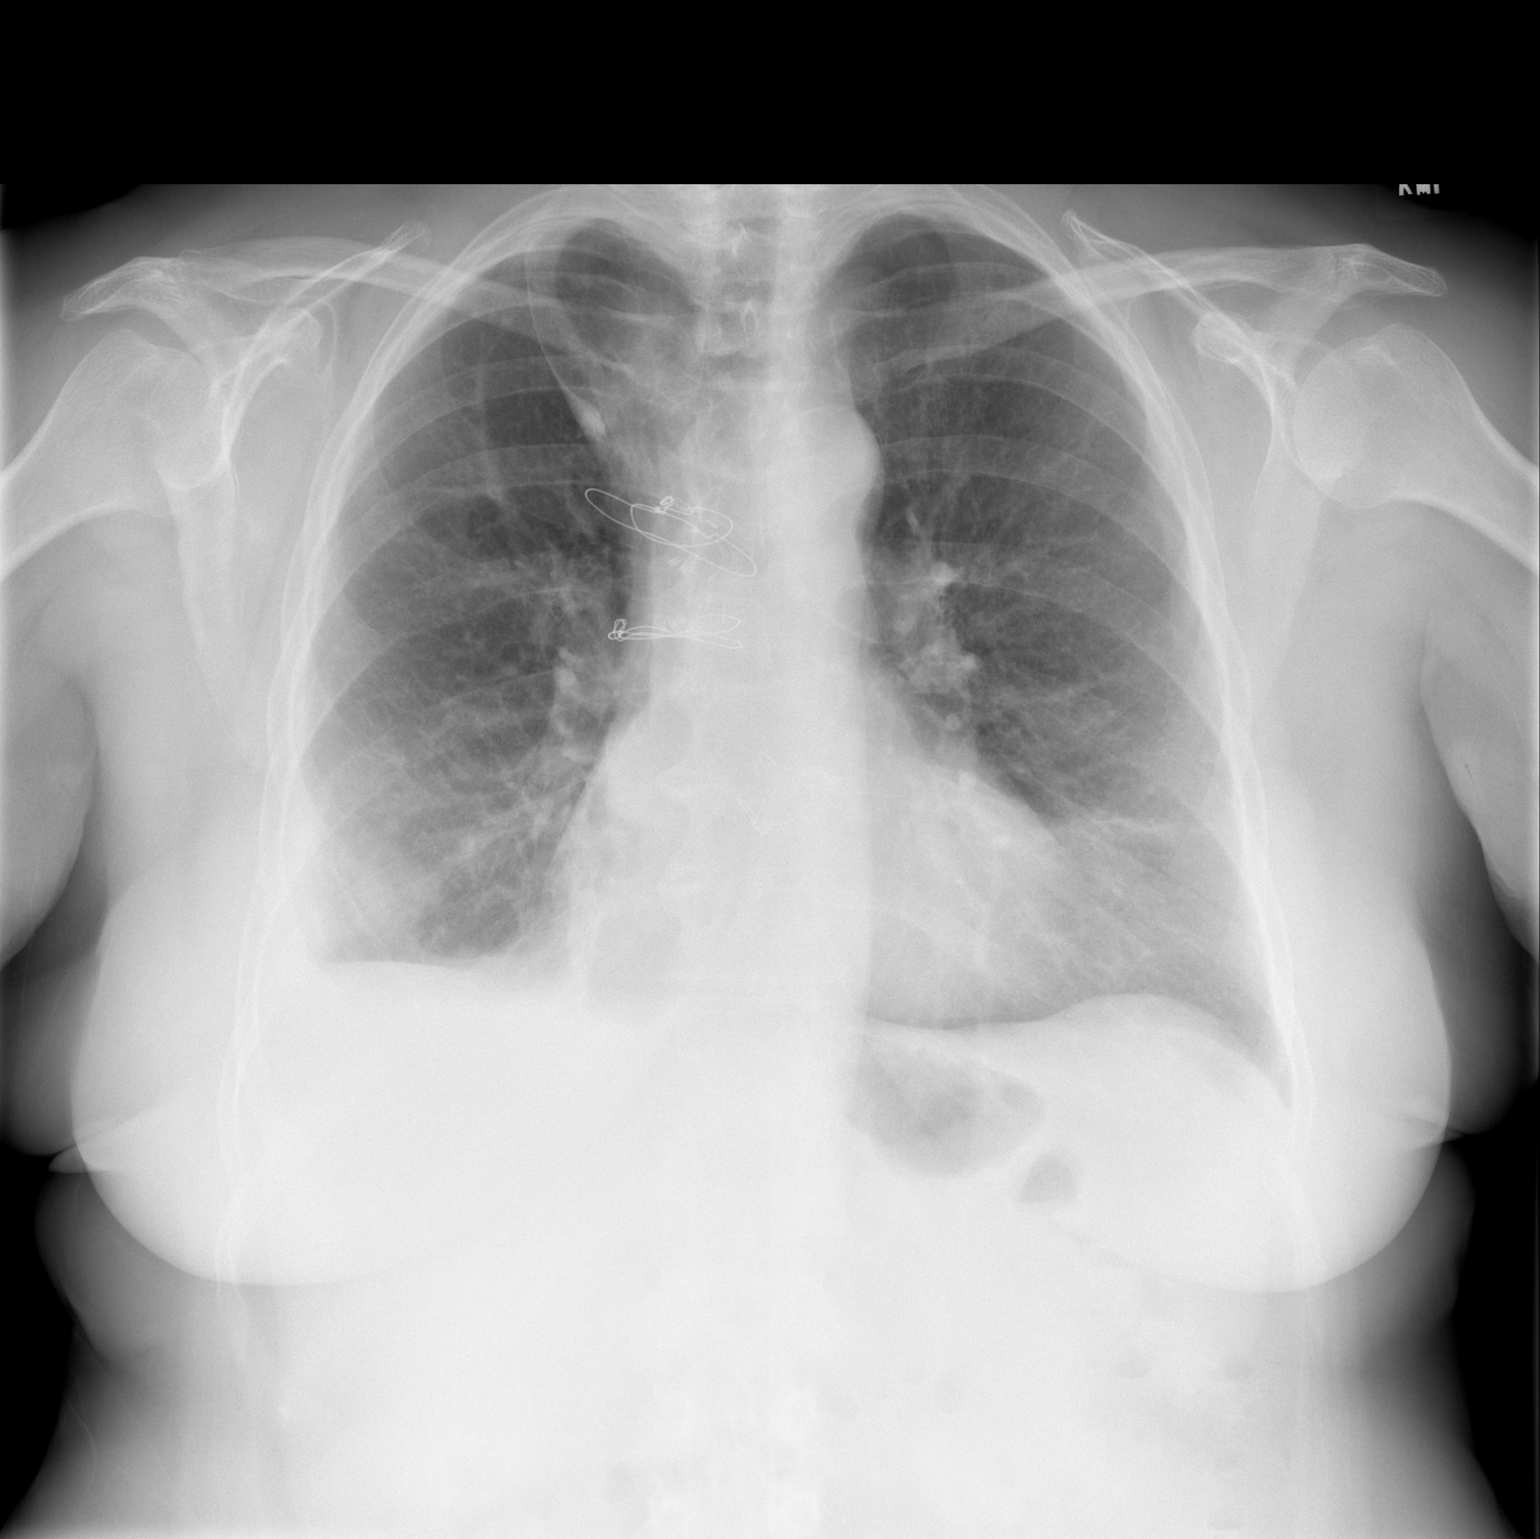

[w chest lat]
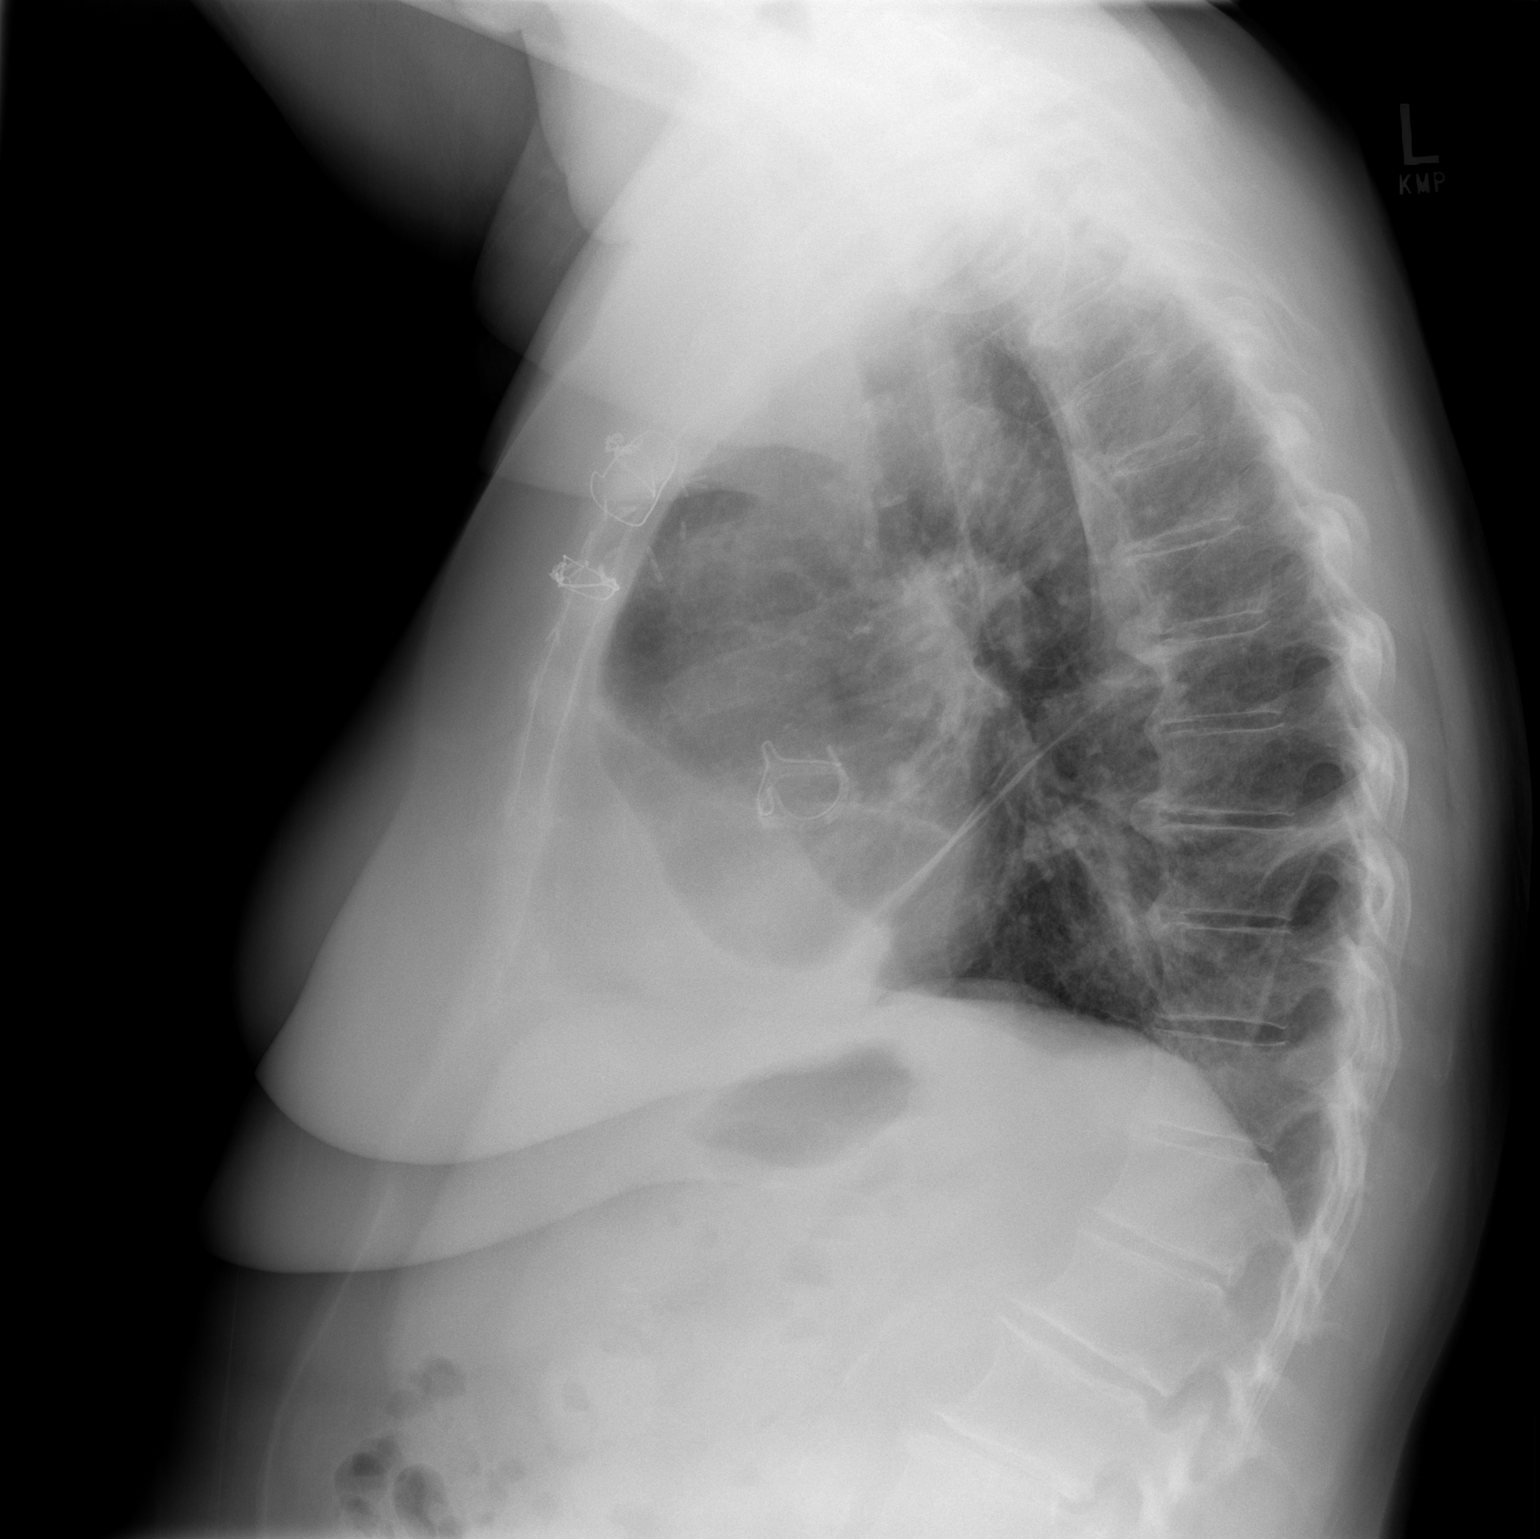

[2 of 2 positions shown; findings below may reference images not displayed]

FINDINGS: The cardiac silhouette is top-normal in size. The patient is status
post aortic valvular replacement. Skin staples have been removed
from the right thoracic soft tissues. Faint minimal residual opacity
at the right lung base laterally, best seen on the frontal view
possibly representing some fluid in the major fissure. No
significant recurrence of pleural fluid. Azygos lobe of incidental
note. Atelectasis at the each lung base I in pneumo with and
lingula.
IMPRESSION: Small faint opacity peripherally at the right lung base may
correspond with a small amount of fluid in the right major fissure.
Minimal bibasilar atelectasis.

## 2017-04-16 ENCOUNTER — Ambulatory Visit (INDEPENDENT_AMBULATORY_CARE_PROVIDER_SITE_OTHER): Payer: Medicare Other | Admitting: Psychology

## 2017-04-16 DIAGNOSIS — R4189 Other symptoms and signs involving cognitive functions and awareness: Secondary | ICD-10-CM

## 2017-04-16 NOTE — Progress Notes (Signed)
   Neuropsychology Note  Judith Schroeder returned today for 1 hour of neuropsychological testing with technician, Milana Kidney, BS, under the supervision of Dr. Macarthur Critchley. The patient did not appear overtly distressed by the testing session, per behavioral observation or via self-report to the technician. Rest breaks were offered. Judith Schroeder will return within 2 weeks for a feedback session with Dr. Si Raider at which time her test performances, clinical impressions and treatment recommendations will be reviewed in detail. The patient understands she can contact our office should she require our assistance before this time.  Full report to follow.

## 2017-04-21 ENCOUNTER — Encounter: Payer: Self-pay | Admitting: Psychology
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
## 2017-04-28 ENCOUNTER — Ambulatory Visit (INDEPENDENT_AMBULATORY_CARE_PROVIDER_SITE_OTHER): Payer: Medicare Other | Admitting: Psychology

## 2017-04-28 DIAGNOSIS — G301 Alzheimer's disease with late onset: Secondary | ICD-10-CM | POA: Diagnosis not present

## 2017-04-28 DIAGNOSIS — F331 Major depressive disorder, recurrent, moderate: Secondary | ICD-10-CM

## 2017-04-28 DIAGNOSIS — F0281 Dementia in other diseases classified elsewhere with behavioral disturbance: Secondary | ICD-10-CM

## 2017-04-28 NOTE — Patient Instructions (Signed)
Clinical Impressions: Mild dementia, most likely secondary to Alzheimer's disease, with behavioral disturbance. Major depressive disorder, moderate to severe. Generalized Anxiety Disorder. Results of the current evaluation reveal deficits in orientation to time, memory consolidation and retrieval, semantic fluency and clock drawing. There is also evidence that her cognitive deficits are interfering with her ability to perform complex IADLs. As such, diagnostic criteria for a dementia syndrome are met. Her cognitive profile on both prior and current testing is highly suggestive of hippocampal consolidation dysfunction as seen in Alzheimer's disease. Her significant depression and anxiety are likely exacerbating cognitive deficits.   Recommendations/Plan: Based on the findings of the present evaluation, the following recommendations are offered:  1. The patient is considered an appropriate candidate for cholinesterase inhibitor therapy. She is taking Aricept. 2. The patient should receive assistance with complex ADLs, including medication management, management of finances and meals, assistance with appointments and transportation to unfamiliar locations. At this time, I think it would be best if the patient allowed her daughter to manage her bank accounts and bills for her. I am glad to hear that the patient's daughter is going to be moving onto the property in the near future. 3. Education regarding Alzheimer's disease was provided to the patient and her daughter. Her daughter may wish to attend a local dementia caregiver support group and/or seek additional information from the Alzheimer's Association ([employer]).  4. The patient should continue to participate in activities which provide mental stimulation, safe cardiovascular exercise, and social interaction. However, traveling out of town overnight is not advised as this has caused such extreme confusion in the past. 5. Treatment for depression and  anxiety: The patient is not treated for either of these conditions. I would recommend consideration of SSRI and/or referral to geriatric psychiatry for evaluation/treatment.

## 2017-04-29 ENCOUNTER — Telehealth: Payer: Self-pay | Admitting: Neurology

## 2017-04-29 ENCOUNTER — Encounter: Payer: Self-pay | Admitting: Neurology

## 2017-04-29 NOTE — Telephone Encounter (Signed)
Delsa Sale, can you fill out a DMV form for a driving test please? thanks

## 2017-04-30 NOTE — Telephone Encounter (Signed)
Driver Re-examination form completed, awaiting MD signature.

## 2017-05-05 ENCOUNTER — Encounter: Payer: Self-pay | Admitting: Psychology

## 2017-06-30 DIAGNOSIS — I359 Nonrheumatic aortic valve disorder, unspecified: Secondary | ICD-10-CM | POA: Diagnosis not present

## 2017-06-30 DIAGNOSIS — I10 Essential (primary) hypertension: Secondary | ICD-10-CM | POA: Diagnosis not present

## 2017-06-30 DIAGNOSIS — E782 Mixed hyperlipidemia: Secondary | ICD-10-CM | POA: Diagnosis not present

## 2017-08-12 ENCOUNTER — Encounter: Payer: Self-pay | Admitting: Neurology

## 2017-08-12 ENCOUNTER — Ambulatory Visit (INDEPENDENT_AMBULATORY_CARE_PROVIDER_SITE_OTHER): Payer: Medicare Other | Admitting: Neurology

## 2017-08-12 VITALS — BP 171/66 | HR 63 | Ht 63.0 in | Wt 211.6 lb

## 2017-08-12 DIAGNOSIS — F0280 Dementia in other diseases classified elsewhere without behavioral disturbance: Secondary | ICD-10-CM

## 2017-08-12 DIAGNOSIS — G301 Alzheimer's disease with late onset: Secondary | ICD-10-CM | POA: Diagnosis not present

## 2017-08-12 MED ORDER — MEMANTINE HCL 10 MG PO TABS: 10.0000 mg | ORAL_TABLET | Freq: Two times a day (BID) | ORAL | 11 refills | Status: DC

## 2017-08-12 NOTE — Patient Instructions (Addendum)
Start Namenda 10mg  daily and in 2-3 weeks increase to 10mg  twice daily Driving evaluation  Memantine Tablets What is this medicine? MEMANTINE (MEM an teen) is used to treat dementia caused by Alzheimer's disease. This medicine may be used for other purposes; ask your health care provider or pharmacist if you have questions. COMMON BRAND NAME(S): Namenda What should I tell my health care provider before I take this medicine? They need to know if you have any of these conditions: -difficulty passing urine -kidney disease -liver disease -seizures -an unusual or allergic reaction to memantine, other medicines, foods, dyes, or preservatives -pregnant or trying to get pregnant -breast-feeding How should I use this medicine? Take this medicine by mouth with a glass of water. Follow the directions on the prescription label. You may take this medicine with or without food. Take your doses at regular intervals. Do not take your medicine more often than directed. Continue to take your medicine even if you feel better. Do not stop taking except on the advice of your doctor or health care professional. Talk to your pediatrician regarding the use of this medicine in children. Special care may be needed. Overdosage: If you think you have taken too much of this medicine contact a poison control center or emergency room at once. NOTE: This medicine is only for you. Do not share this medicine with others. What if I miss a dose? If you miss a dose, take it as soon as you can. If it is almost time for your next dose, take only that dose. Do not take double or extra doses. If you do not take your medicine for several days, contact your health care provider. Your dose may need to be changed. What may interact with this medicine? -acetazolamide -amantadine -cimetidine -dextromethorphan -dofetilide -hydrochlorothiazide -ketamine -metformin -methazolamide -quinidine -ranitidine -sodium  bicarbonate -triamterene This list may not describe all possible interactions. Give your health care provider a list of all the medicines, herbs, non-prescription drugs, or dietary supplements you use. Also tell them if you smoke, drink alcohol, or use illegal drugs. Some items may interact with your medicine. What should I watch for while using this medicine? Visit your doctor or health care professional for regular checks on your progress. Check with your doctor or health care professional if there is no improvement in your symptoms or if they get worse. You may get drowsy or dizzy. Do not drive, use machinery, or do anything that needs mental alertness until you know how this drug affects you. Do not stand or sit up quickly, especially if you are an older patient. This reduces the risk of dizzy or fainting spells. Alcohol can make you more drowsy and dizzy. Avoid alcoholic drinks. What side effects may I notice from receiving this medicine? Side effects that you should report to your doctor or health care professional as soon as possible: -allergic reactions like skin rash, itching or hives, swelling of the face, lips, or tongue -agitation or a feeling of restlessness -depressed mood -dizziness -hallucinations -redness, blistering, peeling or loosening of the skin, including inside the mouth -seizures -vomiting Side effects that usually do not require medical attention (report to your doctor or health care professional if they continue or are bothersome): -constipation -diarrhea -headache -nausea -trouble sleeping This list may not describe all possible side effects. Call your doctor for medical advice about side effects. You may report side effects to FDA at 1-800-FDA-1088. Where should I keep my medicine? Keep out of the reach of children.  Store at room temperature between 15 degrees and 30 degrees C (59 degrees and 86 degrees F). Throw away any unused medicine after the expiration  date. NOTE: This sheet is a summary. It may not cover all possible information. If you have questions about this medicine, talk to your doctor, pharmacist, or health care provider.  2018 Elsevier/Gold Standard (2013-09-20 14:10:42)

## 2017-08-12 NOTE — Progress Notes (Signed)
Judith Schroeder    Provider:  Dr Judith Schroeder Referring Provider: [name], Schroeder Primary Care Physician:  Judith Schroeder   CC: Memory loss  Interval history: Mild dementia, most likely secondary to Alzheimer'Judith disease, with behavioral disturbance. Major depressive disorder, moderate to severe. Generalized Anxiety Disorder. Discussed driving a driving evaluation yearly. Start Namenda. She is making mistakes financially. Discussed POA and HCPOA. Discussed daughter having access to all acounts.   Interval history 08/12/2017: PET CT Scan was negative. Patient reports she has to do a lot of writing down to remember things. She is writing things more.   Interval history 02/04/2017: Memory changes are stable. She is better since last appointment. Daughter is here and she is handling her medications and refills and making sure that is filled, she is eating better, she is making more lists and using calendars. She has been on top of things but facts of day to day activities have been worse, sister with stage 4 cancer but she kept saying she didn't know sister had cancer, stories she tells may not be true. She is trying to be more social. Depression still may be playing a part. Discussed FDG PET scan as a diagnostic measure and our   Interval history 07/30/2016:Patient here for followup of memory loss. Had neurocognitive testing with Judith Schroeder in 2016. She did perform poorly but diagnostic possibilities includedcognitive dysfunction associated with depression, he noted that her cognitive difficulties first manifested coincidently withchanges in her mood and behavior suggestive of depression, that her cognitive difficulties had been occurring on an intermittent basis and that she reported subjectively improved cognitive functioning around the time she hired caregivers for her mother by reducing her stress level. However given the findings it was concerning for cerebral dysfunction. Patient was  advised to follow up for depression and then come back to clinic for further evaluation   Patient returns today. She feels her depression is improved, she is playing golf, being social. Still her memory continues to decline. She has a significant FHx of dementia.Sister has dementia. Father has dementia. Daughter is here who provides information, says patienttells the same story multiple times. Confused. She can "zone out". She will say things that are random. Especially the last 6 months significant decline. Daughter says her depression is not worse, better despite the memory changes.    SHF:WYOVZC Judith Wilsonis a 71 y.o.femalehere as a follow up.  Patient here for follow up of memory loss. She was evaluated by Judith Schroeder and this is likely due to stress and depression. Talked about the results, diagnosis, stressed life changes, therapy, psychiatry, treatment for depression. She is making changes in her life. She is caring for her mother, she has a lot of stress.   b12 and folate nml tsh nml Unremarkable MRI of the brain  Reviewed notes, labs and imaging from outside physicians, which showed: per preliminary report by Judith Schroeder, At this point, it is likely that her attention and memory functioning are being disrupted by ongoing stress and depression although an incipient neurodegenerative process cannot be excluded. In support of the role of mood factors was the report that her cognitive difficulties first manifested coincident with changes in her mood and behavior suggestive of depression; and that she reported subjectively improved cognitive functioning around the time she hired caregivers for her mother thereby reducing her level of life stress. In contrast, the extent of her memory deficit on neurocognitive testing in context of her consistent alertness and attentiveness, relaxed demeanor and apparent  steady effort was troubling.  It is recommended that she be treated for depression with  antidepressant medication, if medically appropriate. She agreed. Psychological counseling should be deferred for the time being given that she was not overly interested in pursuing counseling at this point. If her cognitive difficulties should persist after improvement in mood, then a repeat neuropsychological evaluation should be considered, perhaps in one year.   11/23/2014 initiial visit: Judith Schroeder is a 71 y.o. female here as a referral from Judith Schroeder for cognitive changes. PMHx of HTN, HLD, Aotic valve disorder Judith/p replacement. This past summer she noticed that at times ahe would get distracted and her mind would wander. For example, even playing golf she would get distracted and think of a tax return. She is under a lot of stress, mother is 74 and calling her every day. She loses things, was going to bring a folder today and couldn't find it. She is forgetting appointments, she has to keep them documented. Lives alone. Independent. She pays bills on time, she keeps good records. She is paying her mother'Judith bills. She can't spell, this is chronic. She is losing more recent memory, her memory "goes for a split second" and she has to think hard to remember what she was going into a room to do or where she put something. She thinks about something else and her mind switches over. No confusion or lapses of time or episodes of altered consciousness. She has episodes where she has a cold feeling over her body and she shivers. Memory problems "come and go". Not getting worse. Just happening more often. Father with parkinson'Judith disease. Father with dementia. Fatigued, some days she wants to sleep all days and other days it is fine. She sleeps well. Doesn't know if she snores. She takes her fluid pills at night and gets up at night to urinate but doesn't bother her. She can't take fluid pilld during the day or she wets her pants, she can't urinate a lot like when she is playing golf. She is still social, plays  golf. No focal neurologic symptoms.    Reviewed notes, labs and imaging from outside physicians, which showed: HgbA1c 5.6 cbc/cmp unremarkable  Review of Systems: Patient complains of symptoms per HPI as well as the following symptoms: swelling in legs, easy bruising, confusion, memory loss, feeling cold. Pertinent negatives per HPI. All others negative.    Social History   Social History  . Marital status: Widowed    Spouse name: N/A  . Number of children: 1  . Years of education: 12+   Occupational History  . Retired    Social History Main Topics  . Smoking status: Never Smoker  . Smokeless tobacco: Never Used  . Alcohol use No  . Drug use: No  . Sexual activity: Not on file   Other Topics Concern  . Not on file   Social History Narrative   Patient lives at home alone.    Patient is widowed.    Patient has 1 child   Patient has 2 years of college   Patient is right handed    Caffeine use: Drinks 2 cups coffee per day   2-3 glasses tea per day   No soda        Family History  Problem Relation Age of Onset  . Hypertension Mother   . Cancer Father        head and neck     Past Medical History:  Diagnosis Date  .  Carpal tunnel syndrome   . H/O aortic valve replacement   . High blood pressure   . High cholesterol   . Pleural effusion     Past Surgical History:  Procedure Laterality Date  . AORTIC VALVE REPLACEMENT  2012  . EMPYEMA DRAINAGE  2017  . TUBAL LIGATION    . VIDEO ASSISTED THORACOSCOPY (VATS)/EMPYEMA Right 09/07/2016   Procedure: VIDEO ASSISTED THORACOSCOPY (VATS)/EMPYEMA;  Surgeon: Gaye Pollack, Schroeder;  Location: [facility] OR;  Service: Thoracic;  Laterality: Right;    Current Outpatient Prescriptions  Medication Sig Dispense Refill  . acetaminophen (TYLENOL) 500 MG tablet Take 2 tablets (1,000 mg total) by mouth every 6 (six) hours as needed for mild pain or fever. 30 tablet 0  . aspirin EC 81 MG tablet Take by mouth.    . donepezil  (ARICEPT) 10 MG tablet Take 1 tablet (10 mg total) by mouth at bedtime. 30 tablet 12  . eplerenone (INSPRA) 25 MG tablet Take 25 mg by mouth daily.    . metoprolol succinate (TOPROL-XL) 25 MG 24 hr tablet Take 25 mg by mouth daily.    . rosuvastatin (CRESTOR) 5 MG tablet Take 5 mg by mouth daily.    . memantine (NAMENDA) 10 MG tablet Take 1 tablet (10 mg total) by mouth 2 (two) times daily. 60 tablet 11   No current facility-administered medications for this visit.     Allergies as of 08/12/2017 - Review Complete 08/12/2017  Allergen Reaction Noted  . Atorvastatin Other (See Comments) 09/11/2015    Vitals: BP (!) 171/66 (BP Location: Right Arm, Patient Position: Sitting, Cuff Size: Large)   Pulse 63   Ht 5\' 3"  (1.6 m)   Wt 211 lb 9.6 oz (96 kg)   BMI 37.48 kg/m  Last Weight:  Wt Readings from Last 1 Encounters:  08/12/17 211 lb 9.6 oz (96 kg)   Last Height:   Ht Readings from Last 1 Encounters:  08/12/17 5\' 3"  (1.6 m)    Physical exam: Exam: Gen: NAD, conversant, well nourised, obese, well groomed                      Neuro: Detailed Neurologic Exam  Speech:    Speech is normal; fluent and spontaneous  Cranial Nerves:    The pupils are equal, round, and reactive to light. Visual fields are full to finger confrontation. Extraocular movements are intact. Trigeminal sensation is intact and the muscles of mastication are normal. The face is symmetric. The palate elevates in the midline. Hearing intact. Voice is normal. Shoulder shrug is normal. The tongue has normal motion without fasciculations.   Motor Observation:    No asymmetry, no atrophy, and no involuntary movements noted. Tone:    Normal muscle tone.    Posture:    Posture is normal. normal erect    Strength:    Strength is V/V in the upper and lower limbs.       MMSE - Mini Mental State Exam 08/12/2017 02/04/2017 07/30/2016  Orientation to time 2 1 2   Orientation to Place 4 3 3   Registration 3 3 3     Attention/ Calculation 5 4 5   Recall 0 2 1  Language- name 2 objects 2 2 2   Language- repeat 1 1 1   Language- follow 3 step command 3 3 3   Language- read & follow direction 1 1 1   Write a sentence 1 1 1   Copy design 1 1 1   Total score 23  11 23      Assessment/Plan: Judith Schroeder is a 71y.o. female here as a referral from Judith Schroeder for Dementia, diagnosed by recent neurocognitive testing. PMHx of HTN, HLD, Aortic valve disorder Judith/p replacement. Here with her daughter.     b12 and folate nml tsh nml Unremarkable MRI of the brain Will order FDG PET scan: Normal Can consider increassing aricept to 2x a day. Neurocognitive testing: Mild dementia Discussed Clinical trials, they decline at this time Discussed POA and HCPOA and assisted living [facility] Needs a driving evaluation, provided the information  Cc: Dr. Cecille Aver, [facility] Schroeder [address] [place], [place] [address]  Phone [phone] Fax [phone] Sarina Ill, Schroeder  [facility] [address] [place], [place] [address]  Phone [phone] Fax [phone]  A total of 30 minutes was spent face-to-face with this patient. Over half this time was spent on counseling patient on the dementia diagnosis and different diagnostic and therapeutic options available.

## 2017-08-13 ENCOUNTER — Other Ambulatory Visit: Payer: Self-pay | Admitting: Neurology

## 2017-08-13 ENCOUNTER — Telehealth: Payer: Self-pay | Admitting: *Deleted

## 2017-08-13 NOTE — Telephone Encounter (Signed)
Faxed referral for driving eval to [facility] in [place], [place]. Fax: [phone]. Received confirmation. Phone: [phone]

## 2017-09-22 ENCOUNTER — Other Ambulatory Visit: Payer: Self-pay | Admitting: Neurology

## 2017-10-27 ENCOUNTER — Other Ambulatory Visit: Payer: Self-pay | Admitting: Neurology

## 2017-12-03 ENCOUNTER — Other Ambulatory Visit: Payer: Self-pay | Admitting: Neurology

## 2017-12-16 DIAGNOSIS — B029 Zoster without complications: Secondary | ICD-10-CM

## 2017-12-16 HISTORY — DX: Zoster without complications: B02.9

## 2018-01-02 DIAGNOSIS — Z23 Encounter for immunization: Secondary | ICD-10-CM | POA: Diagnosis not present

## 2018-01-02 DIAGNOSIS — I10 Essential (primary) hypertension: Secondary | ICD-10-CM | POA: Diagnosis not present

## 2018-01-02 DIAGNOSIS — I359 Nonrheumatic aortic valve disorder, unspecified: Secondary | ICD-10-CM | POA: Diagnosis not present

## 2018-01-02 DIAGNOSIS — R0602 Shortness of breath: Secondary | ICD-10-CM | POA: Diagnosis not present

## 2018-03-11 ENCOUNTER — Other Ambulatory Visit: Payer: Self-pay

## 2018-03-11 ENCOUNTER — Emergency Department (HOSPITAL_COMMUNITY)
Admission: EM | Admit: 2018-03-11 | Discharge: 2018-03-11 | Disposition: A | Discharge status: Home / Self Care | Payer: Medicare Other | Attending: Emergency Medicine | Admitting: Emergency Medicine

## 2018-03-11 ENCOUNTER — Encounter (HOSPITAL_COMMUNITY): Payer: Self-pay | Admitting: Emergency Medicine

## 2018-03-11 DIAGNOSIS — L539 Erythematous condition, unspecified: Secondary | ICD-10-CM | POA: Diagnosis not present

## 2018-03-11 DIAGNOSIS — B0230 Zoster ocular disease, unspecified: Secondary | ICD-10-CM | POA: Insufficient documentation

## 2018-03-11 DIAGNOSIS — B0239 Other herpes zoster eye disease: Secondary | ICD-10-CM | POA: Diagnosis not present

## 2018-03-11 DIAGNOSIS — R21 Rash and other nonspecific skin eruption: Secondary | ICD-10-CM | POA: Diagnosis present

## 2018-03-11 MED ORDER — FLUORESCEIN SODIUM 1 MG OP STRP
1.0000 | ORAL_STRIP | Freq: Once | OPHTHALMIC | Status: AC
  Administered 2018-03-11: 1 via OPHTHALMIC
  Filled 2018-03-11: qty 1

## 2018-03-11 MED ORDER — PREDNISONE 10 MG (21) PO TBPK: ORAL_TABLET | Freq: Every day | ORAL | 0 refills | Status: DC

## 2018-03-11 MED ORDER — ACYCLOVIR 800 MG PO TABS
800.0000 mg | ORAL_TABLET | Freq: Once | ORAL | Status: AC
Start: 2018-03-11 — End: 2018-03-11
  Administered 2018-03-11: 800 mg via ORAL
  Filled 2018-03-11: qty 1

## 2018-03-11 MED ORDER — ACYCLOVIR 800 MG PO TABS: 800.0000 mg | ORAL_TABLET | Freq: Every day | ORAL | 0 refills | Status: AC

## 2018-03-11 NOTE — ED Triage Notes (Signed)
Pt reports rash to face "off and on for weeks" today worse and into eye. States hasn't been painful until today.

## 2018-03-11 NOTE — ED Notes (Signed)
Fluorescein strip at bedside

## 2018-03-11 NOTE — Discharge Instructions (Signed)
You were seen in the ED today with a rash on the face called shingles. This rash is painful and will not go away immediately. I am starting you on antiviral medications and steroids. The steroids can cause confusion in someone your age so call your PCP immediately if this develops. You have an appointment with the eye doctor, Dr. Posey Pronto, for tomorrow at 1 PM. Call in the AM to confirm. The address is attached. Take Tylenol as needed for pain.   Return to the ED with any fever, severe headache, vision changes, vomiting, or abdominal pain.

## 2018-03-11 NOTE — ED Provider Notes (Signed)
Emergency Department Provider Note   I have reviewed the triage vital signs and the nursing notes.   HISTORY  Chief Complaint Rash (face )   HPI Judith Schroeder is a 72 y.o. female with PMH of HTN, HLD, and mild cognitive changes since to the emergency department for evaluation of sudden onset left upper face rash with eye redness.  The rash is painful and itching.  No fevers or chills.  Patient began to have some left eye redness yesterday.  Denies any similar rash in the last weeks as was reported in triage.  No changes to vision.  No pain with extraocular movements.  No prior history of zoster.  The rash worsened over the left face today. No worsening confusion or HA.    Past Medical History:  Diagnosis Date  . Carpal tunnel syndrome   . H/O aortic valve replacement   . High blood pressure   . High cholesterol   . Pleural effusion     Patient Active Problem List   Diagnosis Date Noted  . Empyema ([place]) 09/07/2016  . Pleural effusion 09/05/2016  . Pleural effusion on right 09/05/2016  . MCI (mild cognitive impairment) with memory loss 07/31/2016  . Cognitive changes 11/23/2014  . Fatigue 11/23/2014  . Cold feeling 11/23/2014    Past Surgical History:  Procedure Laterality Date  . AORTIC VALVE REPLACEMENT  2012  . EMPYEMA DRAINAGE  2017  . TUBAL LIGATION    . VIDEO ASSISTED THORACOSCOPY (VATS)/EMPYEMA Right 09/07/2016   Procedure: VIDEO ASSISTED THORACOSCOPY (VATS)/EMPYEMA;  Surgeon: Gaye Pollack, MD;  Location: [facility] OR;  Service: Thoracic;  Laterality: Right;    Current Outpatient Rx  . Order #: [id number] Class: Historical Med  . Order #: [id number] Class: Normal  . Order #: [id number] Class: Historical Med  . Order #: [id number] Class: Normal  . Order #: [id number] Class: Historical Med  . Order #: [id number] Class: Historical Med  . Order #: [id number] Class: No Print  . Order #: [id number] Class: Print  . Order #: [id number] Class: Print     Allergies Atorvastatin  Family History  Problem Relation Age of Onset  . Hypertension Mother   . Cancer Father        head and neck     Social History Social History   Tobacco Use  . Smoking status: Never Smoker  . Smokeless tobacco: Never Used  Substance Use Topics  . Alcohol use: No    Alcohol/week: 0.0 oz  . Drug use: No    Review of Systems  Constitutional: No fever/chills Eyes: No visual changes. Positive left eye redness and pain.  ENT: No sore throat.  Cardiovascular: Denies chest pain. Respiratory: Denies shortness of breath. Gastrointestinal: No abdominal pain.  No nausea, no vomiting.  No diarrhea.  No constipation. Genitourinary: Negative for dysuria. Musculoskeletal: Negative for back pain. Skin: Painful/itchy rash over the left upper face.  Neurological: Negative for headaches, focal weakness or numbness.  10-point ROS otherwise negative.  ____________________________________________   PHYSICAL EXAM:  VITAL SIGNS: ED Triage Vitals  Enc Vitals Group     BP 03/11/18 1923 (!) 169/76     Pulse Rate 03/11/18 1923 79     Resp 03/11/18 1923 20     Temp 03/11/18 1923 99.2 F (37.3 C)     Temp Source 03/11/18 1923 Oral     SpO2 03/11/18 1923 98 %     Weight 03/11/18 1924 210 lb (95.3 kg)     Height 03/11/18 1924 5\' 2"  (1.575 m)  Pain Score 03/11/18 1924 5   Constitutional: Alert and oriented. Well appearing and in no acute distress. Eyes: Conjunctivae are injected on the left. On fluorescein staining there are no dendritic lesions visible.  Head: Atraumatic. Nose: No congestion/rhinnorhea. Mouth/Throat: Mucous membranes are moist.  Oropharynx non-erythematous. Neck: No stridor.  No meningeal signs.  Cardiovascular: Normal rate, regular rhythm. Good peripheral circulation. Grossly normal heart sounds.   Respiratory: Normal respiratory effort.   Gastrointestinal:  No distention.  Musculoskeletal: No lower extremity tenderness nor edema. No  gross deformities of extremities. Neurologic:  Normal speech and language. No gross focal neurologic deficits are appreciated.  Skin: Left upper face erythema without patechiea in a dermatomal distribution involving the forehead, scalp, and tip of the nose.   ____________________________________________  RADIOLOGY  None ____________________________________________   PROCEDURES  Procedure(s) performed:   Procedures  None ____________________________________________   INITIAL IMPRESSION / ASSESSMENT AND PLAN / ED COURSE  Pertinent labs & imaging results that were available during my care of the patient were reviewed by me and considered in my medical decision making (see chart for details).  Patient presents to the emergency department for evaluation of left upper face rash.  The borders of the rash are sharply dermatomal and rash quality is consistent with zoster.  The left conjunctive are injected.  Plan for fluorescein staining and discussion with ophthalmology.  No headache, confusion, signs of developing encephalopathy.  08:49 PM No dendritic lesions on my floor seen exam.  I spoke with ophthalmology Dr. Posey Pronto who will see the patient in office tomorrow afternoon at 1 PM.  He advises no topical I steroid at this time pending his exam.  I provided the contact information to the patient and daughter at bedside.  Patient will be started on acyclovir and steroid.  Discussed that with the patient's age steroid could cause some confusion but given the location of her zoster and the fact that the rash began only today I believe she may benefit from this and that the potential benefits outweigh any risk.  Patient to also follow-up with the PCP.  Advised that she is contagious with this rash and should avoid newborns and other compromised individuals.  At this time, I do not feel there is any life-threatening condition present. I have reviewed and discussed all results (EKG, imaging, lab,  urine as appropriate), exam findings with patient. I have reviewed nursing notes and appropriate previous records.  I feel the patient is safe to be discharged home without further emergent workup. Discussed usual and customary return precautions. Patient and family (if present) verbalize understanding and are comfortable with this plan.  Patient will follow-up with their primary care provider. If they do not have a primary care provider, information for follow-up has been provided to them. All questions have been answered.  ____________________________________________  FINAL CLINICAL IMPRESSION(S) / ED DIAGNOSES  Final diagnoses:  Herpes zoster with ophthalmic complication, unspecified herpes zoster eye disease     MEDICATIONS GIVEN DURING THIS VISIT:  Medications  fluorescein ophthalmic strip 1 strip (has no administration in time range)  acyclovir (ZOVIRAX) tablet 800 mg (800 mg Oral Given 03/11/18 2117)     NEW OUTPATIENT MEDICATIONS STARTED DURING THIS VISIT:  New Prescriptions   ACYCLOVIR (ZOVIRAX) 800 MG TABLET    Take 1 tablet (800 mg total) by mouth 5 (five) times daily for 10 days.   PREDNISONE (STERAPRED UNI-PAK 21 TAB) 10 MG (21) TBPK TABLET    Take by mouth daily. Take 6 tabs  by mouth daily  for 2 days, then 5 tabs for 2 days, then 4 tabs for 2 days, then 3 tabs for 2 days, 2 tabs for 2 days, then 1 tab by mouth daily for 2 days    Note:  This document was prepared using Dragon voice recognition software and may include unintentional dictation errors.  Nanda Quinton, MD Emergency Medicine    Ceriah Kohler, [facility], MD 03/11/18 2124

## 2018-03-12 DIAGNOSIS — B0239 Other herpes zoster eye disease: Secondary | ICD-10-CM | POA: Diagnosis not present

## 2018-03-12 DIAGNOSIS — S0502XA Injury of conjunctiva and corneal abrasion without foreign body, left eye, initial encounter: Secondary | ICD-10-CM | POA: Diagnosis not present

## 2018-03-16 ENCOUNTER — Encounter: Payer: Self-pay | Admitting: Neurology

## 2018-03-16 ENCOUNTER — Encounter: Payer: Self-pay | Admitting: Psychology

## 2018-03-16 DIAGNOSIS — B0233 Zoster keratitis: Secondary | ICD-10-CM | POA: Diagnosis not present

## 2018-03-16 DIAGNOSIS — B0239 Other herpes zoster eye disease: Secondary | ICD-10-CM | POA: Diagnosis not present

## 2018-03-16 DIAGNOSIS — B0232 Zoster iridocyclitis: Secondary | ICD-10-CM | POA: Diagnosis not present

## 2018-03-27 DIAGNOSIS — B0239 Other herpes zoster eye disease: Secondary | ICD-10-CM | POA: Diagnosis not present

## 2018-03-27 DIAGNOSIS — B0232 Zoster iridocyclitis: Secondary | ICD-10-CM | POA: Diagnosis not present

## 2018-03-27 DIAGNOSIS — B0233 Zoster keratitis: Secondary | ICD-10-CM | POA: Diagnosis not present

## 2018-04-13 DIAGNOSIS — H3589 Other specified retinal disorders: Secondary | ICD-10-CM | POA: Diagnosis not present

## 2018-04-13 DIAGNOSIS — B0239 Other herpes zoster eye disease: Secondary | ICD-10-CM | POA: Diagnosis not present

## 2018-04-13 DIAGNOSIS — B0233 Zoster keratitis: Secondary | ICD-10-CM | POA: Diagnosis not present

## 2018-04-13 DIAGNOSIS — B0232 Zoster iridocyclitis: Secondary | ICD-10-CM | POA: Diagnosis not present

## 2018-05-01 DIAGNOSIS — Z6839 Body mass index (BMI) 39.0-39.9, adult: Secondary | ICD-10-CM | POA: Diagnosis not present

## 2018-05-01 DIAGNOSIS — B0229 Other postherpetic nervous system involvement: Secondary | ICD-10-CM | POA: Diagnosis not present

## 2018-05-01 DIAGNOSIS — N390 Urinary tract infection, site not specified: Secondary | ICD-10-CM | POA: Diagnosis not present

## 2018-05-22 DIAGNOSIS — B0229 Other postherpetic nervous system involvement: Secondary | ICD-10-CM | POA: Diagnosis not present

## 2018-05-22 DIAGNOSIS — F0391 Unspecified dementia with behavioral disturbance: Secondary | ICD-10-CM | POA: Diagnosis not present

## 2018-05-27 DIAGNOSIS — B0239 Other herpes zoster eye disease: Secondary | ICD-10-CM | POA: Diagnosis not present

## 2018-05-27 DIAGNOSIS — B0232 Zoster iridocyclitis: Secondary | ICD-10-CM | POA: Diagnosis not present

## 2018-05-27 DIAGNOSIS — H3589 Other specified retinal disorders: Secondary | ICD-10-CM | POA: Diagnosis not present

## 2018-05-27 DIAGNOSIS — B0233 Zoster keratitis: Secondary | ICD-10-CM | POA: Diagnosis not present

## 2018-06-17 DIAGNOSIS — R51 Headache: Secondary | ICD-10-CM | POA: Diagnosis not present

## 2018-06-17 DIAGNOSIS — B0239 Other herpes zoster eye disease: Secondary | ICD-10-CM | POA: Diagnosis not present

## 2018-06-17 DIAGNOSIS — B0232 Zoster iridocyclitis: Secondary | ICD-10-CM | POA: Diagnosis not present

## 2018-07-17 DIAGNOSIS — G300 Alzheimer's disease with early onset: Secondary | ICD-10-CM | POA: Diagnosis not present

## 2018-07-17 DIAGNOSIS — B0229 Other postherpetic nervous system involvement: Secondary | ICD-10-CM | POA: Diagnosis not present

## 2018-08-13 ENCOUNTER — Ambulatory Visit: Payer: Self-pay | Admitting: Neurology

## 2018-09-14 ENCOUNTER — Other Ambulatory Visit: Payer: Self-pay | Admitting: Neurology

## 2018-09-14 DIAGNOSIS — R51 Headache: Secondary | ICD-10-CM | POA: Diagnosis not present

## 2018-09-14 DIAGNOSIS — B0232 Zoster iridocyclitis: Secondary | ICD-10-CM | POA: Diagnosis not present

## 2018-09-14 DIAGNOSIS — B0239 Other herpes zoster eye disease: Secondary | ICD-10-CM | POA: Diagnosis not present

## 2018-09-15 ENCOUNTER — Ambulatory Visit: Payer: Self-pay | Admitting: Neurology

## 2018-09-22 ENCOUNTER — Telehealth: Payer: Self-pay | Admitting: Neurology

## 2018-09-22 ENCOUNTER — Encounter: Payer: Self-pay | Admitting: Neurology

## 2018-09-22 NOTE — Progress Notes (Signed)
error 

## 2018-09-22 NOTE — Telephone Encounter (Signed)
error 

## 2018-09-23 ENCOUNTER — Encounter: Payer: Self-pay | Admitting: Neurology

## 2018-09-23 ENCOUNTER — Ambulatory Visit (INDEPENDENT_AMBULATORY_CARE_PROVIDER_SITE_OTHER): Payer: Medicare Other | Admitting: Neurology

## 2018-09-23 VITALS — BP 160/85 | HR 64 | Ht 63.0 in | Wt 210.0 lb

## 2018-09-23 DIAGNOSIS — G529 Cranial nerve disorder, unspecified: Secondary | ICD-10-CM

## 2018-09-23 DIAGNOSIS — N3942 Incontinence without sensory awareness: Secondary | ICD-10-CM

## 2018-09-23 DIAGNOSIS — H5462 Unqualified visual loss, left eye, normal vision right eye: Secondary | ICD-10-CM | POA: Diagnosis not present

## 2018-09-23 DIAGNOSIS — G301 Alzheimer's disease with late onset: Secondary | ICD-10-CM

## 2018-09-23 DIAGNOSIS — I671 Cerebral aneurysm, nonruptured: Secondary | ICD-10-CM

## 2018-09-23 DIAGNOSIS — F0281 Dementia in other diseases classified elsewhere with behavioral disturbance: Secondary | ICD-10-CM | POA: Diagnosis not present

## 2018-09-23 DIAGNOSIS — R300 Dysuria: Secondary | ICD-10-CM

## 2018-09-23 DIAGNOSIS — H21562 Pupillary abnormality, left eye: Secondary | ICD-10-CM

## 2018-09-23 DIAGNOSIS — F02818 Dementia in other diseases classified elsewhere, unspecified severity, with other behavioral disturbance: Secondary | ICD-10-CM

## 2018-09-23 NOTE — Patient Instructions (Addendum)
[facility] MRI brain and MRA head Refer to Dr. Manuella Ghazi  For adult daycare [facility] [address] [place] [address]  [place], [place] [address] 10:00 AM Gwendolyn Fill, 2nd Thursday of each month Please RSVP with Christianna Lovena Le, [facility] Caregiver Support Coordinator [phone] or [email or web address] Last updated: February 2017  [facility] [employer] [facility]   Recommendations to prevent or slow progression of cognitive decline:   Exercise You should increase exercise 30 to 45 minutes per day at least 3 days a week although 5 to 7 would be preferred. Any type of exercise (including walking) is acceptable although a recumbent bicycle may be best if you are unsteady. Disease related apathy can be a significant roadblock to exercise and the only way to overcome this is to make it a daily routine and perhaps have a reward at the end (something your loved one loves to eat or drink perhaps) or a personal trainer coming to the home can also be very useful. In general a structured, repetitive schedule is best.   Cardiovascular Health: You should optimize all cardiovascular risk factors (blood pressure, sugar, cholesterol) as vascular disease such as strokes and heart attacks can make memory problems much worse.   Diet: Eating a heart healthy (Mediterranean) diet is also a good idea; fish and poultry instead of red meat, nuts (mostly non-peanuts), vegetables, fruits, olive oil or canola oil (instead of butter), minimal salt (use other spices to flavor foods), whole grain rice, bread, cereal and pasta and wine in moderation.  General Health: Any diseases which effect your body will effect your brain such as a pneumonia, urinary infection, blood clot, heart attack or stroke. Keep contact with your primary care doctor for regular follow ups.  Sleep. A good nights sleep is healthy for the brain. Seven  hours is recommended. If you have insomnia or poor sleep habits see the recommendations below  Tips: Structured and consistent daytime and nighttime routine, including regular wake times, bedtimes, and mealtimes, will be important for the patient to avoid confusion. Keeping frequently used items in designated places will help reduce stress from searching. If there are worries about getting lost do not let the patient leave home unaccompanied. They might benefit from wearing an identification bracelet that will help others assist in finding home if they become lost. Information about nationwide safe return services and other helpful resources may be obtained through the Alzheimer's Association helpline at [phone].  Finances, Power of Producer, television/film/video Directives: You should consider putting legal safeguards in place with regard to financial and medical decision making. While the spouse always has power of attorney for medical and financial issues in the absence of any form, you should consider what you want in case the spouse / caregiver is no longer around or capable of making decisions.   Kinde : http://www.welch.com/.pdf  Or Google "[place]" AND "An Forensic scientist for [employer]  Other States: ApartmentMom.com.ee  The signature on these forms should be notarized.   DRIVING:   Driving only during the day Drive only to familiar Locations Avoid driving during bad weather  If you would like to be tested to see if you are driving safely, [facility] has a Clinical Driving Evaluation. To schedule an appointment call [phone].                RESOURCES:  Memory Loss: Improve your short term memory By Silvio Pate  The  Alzheimer's Reading Room http://www.alzheimersreadingroom.com/   The Alzheimer's Compendium  http://www.alzcompend.info/  [employer] www.dukefamilysupport.STM ([phone]  Recommended resources for caregivers (All can be purchased on [employer]):  1) A Caregiver's Guide to Dementia: Using Activities and Other Strategies to Prevent, Reduce and Manage Behavioral Symptoms by Osie Bond. Tyler Aas and [employer]   2) A Caregiver's Guide to [employer] Dementia by Caleen Essex MS BSN and Gaston Islam   3) What If It's Not Alzheimer's?: A Caregiver's Guide to Dementia by Koren Shiver (Author), Octaviano Batty (Editor)  3) The 36 hour day by Rabins and Mace  4) Understanding Difficult Behaviors by Merita Norton and White  Online course for helping caregivers reduce stress, guilt and frustration called the Caregivers Helpbook. The website is www.powerfultoolsforcaregivers.org  As a caregiver you are a Art gallery manager. Problems you face as a caregiver are usually unique to your situation and the way your loved-one's disease manifests itself. The best way to use these books is to look at the Table of Contents and read any chapters of interest or that apply to challenges you are having as a caregiver.  NATIONAL RESOURCES: For more information on neurological disorders or research programs funded by the [employer] of Neurological Disorders and Stroke, contact the Institute's Agricultural consultant (BRAIN) at: [place] [address] [phone] (toll-free) MasterBoxes.it  Information on dementia is also available from the following organizations: Alzheimer's Disease Education and Referral ([place]) [place] on Aging [address] [place] [address] [phone] (toll-free) DVDEnthusiasts.nl  Alzheimer's Association [address], [place] [address] [phone] (toll-free, 24-hour helpline) 7703424769 (TDD) CapitalMile.co.nz  Alzheimer's  Foundation of America [address], [place] [address] [phone] (toll-free) www.alzfdn.org  [facility] [place] [address] [place] [address] [phone] www.alzdiscovery.org  Association for [place] [address], [place] [address] [phone] (toll-free) www[place] [address] [phone] (toll-free) www.brightfocus.org/alzheimers  Doran Stabler [facility] Alzheimer's Foundation [address] [place] [address] [phone] www.https://lambert-jackson.net/  Lewy Body Dementia Association [address], [place] [address] [phone] (toll-free LBD Caregiver Link) www.lbda.[employer], [place], [place] [address] [phone] (toll-free) [phone] [facility]) https://carter.com/  National Organization for Rare Disorders [address] [place] [address] [phone]) (toll-free) www.rarediseases.org  The Dementias: Hope Through Research was jointly produced by the [employer] of Neurological Disorders and Stroke (NINDS) and the [employer] on Aging (NIA), both part of the [employer], the [employer] research agency-supporting scientific studies that turn discovery into health. NINDS is the nation's leading funder of research on the brain and nervous system. The NINDS mission is to reduce the burden of neurological disease. For more information and resources, visit MasterBoxes.it [1] or call 424-692-9842. NIA leads the federal government effort conducting and supporting research on aging and the health and well-being of older people. NIA's Alzheimer's Disease Education and Referral (ADEAR) Center offers information and  publications on dementia and caregiving for families, caregivers, and professionals. For more information, visit DVDEnthusiasts.nl [2] or call 4323050335. Also available from NIA are publications and information about Alzheimer's disease as well as the booklets Frontotemporal Disorders: Information for Patients, Families, and Caregivers and Lewy Body Dementia: Information for Patients, Families, and Professionals. Source URL: SocialSpecialists.co.nz

## 2018-09-23 NOTE — Progress Notes (Signed)
[name]    Provider:  Dr Judith Schroeder Referring Provider: [name], MD Primary Care Physician:  Judith Noble, MD   CC: Memory loss  Interval history 09/23/2018: patient is here for a one year evaluation. At last appointment she was still functional and coherent enough  to make decisions on health and finances. It appears she has significantly declined this year. She says her memory worsened this year and she got in "very bad shape" she reports forgetting everything  Even things people just said or where is at or where she is going. She forgot to pay bills, daughter helping more this year, with medication management and financial management patient is not managing her finances or her medication herself at all. She is less sociable, she gets lots of telemarking calls and she is giving them personal information she should not. She gives them her name, DOB, SSN, patient endorses this and daughter also supports reports. She forgets what street she is on or if she ate that day. Unclear if patient is bathing or eating. Discussed I am concerned she cannot live alone anymore and may need 24x7 monitoring. She doesn't want to go out and do things like she used to.  Also recommended no driving, she is not driving anymore. Patient says she feels safe in her home alone, I discussed they should possibly consider a memory unit. She hears noises at night. Per daughter she is telling stories that are not factual. Decline started earlier this year and has been slowly progressive more recently a little quicker and a noticeable change in the last 3 weeks. No low back pain, no new difficulty with walking, she is not as active. No neck pain. No new eakness. No fevers, no chills, no difficulty speaking or with aphasia. She is having blurry vision in the left eye, ongoing for a good while since the shingles and her pupil is affected. She had shingles and her left eye was affected her pupil is non  reactive.   Interval history: Mild dementia, most likely secondary to Alzheimer's disease, with behavioral disturbance. Major depressive disorder, moderate to severe. Generalized Anxiety Disorder. Discussed driving a driving evaluation yearly. Start Namenda. She is making mistakes financially. Discussed POA and HCPOA. Discussed daughter having access to all acounts.   Interval history 08/12/2017: PET CT Scan was negative. Patient reports she has to do a lot of writing down to remember things. She is writing things more.   Interval history 02/04/2017: Memory changes are stable. She is better since last appointment. Daughter is here and she is handling her medications and refills and making sure that is filled, she is eating better, she is making more lists and using calendars. She has been on top of things but facts of day to day activities have been worse, sister with stage 4 cancer but she kept saying she didn't know sister had cancer, stories she tells may not be true. She is trying to be more social. Depression still may be playing a part. Discussed FDG PET scan as a diagnostic measure and our   Interval history 07/30/2016:Patient here for followup of memory loss. Had neurocognitive testing with [facility] in 2016. She did perform poorly but diagnostic possibilities includedcognitive dysfunction associated with depression, he noted that her cognitive difficulties first manifested coincidently withchanges in her mood and behavior suggestive of depression, that her cognitive difficulties had been occurring on an intermittent basis and that she reported subjectively improved cognitive functioning around the time she hired caregivers for  her mother by reducing her stress level. However given the findings it was concerning for cerebral dysfunction. Patient was advised to follow up for depression and then come back to clinic for further evaluation   Patient returns today. She feels her depression is improved,  she is playing golf, being social. Still her memory continues to decline. She has a significant FHx of dementia.Sister has dementia. Father has dementia. Daughter is here who provides information, says patienttells the same story multiple times. Confused. She can "zone out". She will say things that are random. Especially the last 6 months significant decline. Daughter says her depression is not worse, better despite the memory changes.    Judith S Wilsonis a 72 y.o.femalehere as a follow up.  Patient here for follow up of memory loss. She was evaluated by Dr. Valentina Shaggy and this is likely due to stress and depression. Talked about the results, diagnosis, stressed life changes, therapy, psychiatry, treatment for depression. She is making changes in her life. She is caring for her mother, she has a lot of stress.   b12 and folate nml tsh nml Unremarkable MRI of the brain  Reviewed notes, labs and imaging from outside physicians, which showed: per preliminary report by Dr. Valentina Shaggy, At this point, it is likely that her attention and memory functioning are being disrupted by ongoing stress and depression although an incipient neurodegenerative process cannot be excluded. In support of the role of mood factors was the report that her cognitive difficulties first manifested coincident with changes in her mood and behavior suggestive of depression; and that she reported subjectively improved cognitive functioning around the time she hired caregivers for her mother thereby reducing her level of life stress. In contrast, the extent of her memory deficit on neurocognitive testing in context of her consistent alertness and attentiveness, relaxed demeanor and apparent steady effort was troubling.  It is recommended that she be treated for depression with antidepressant medication, if medically appropriate. She agreed. Psychological counseling should be deferred for the time being given that she was not  overly interested in pursuing counseling at this point. If her cognitive difficulties should persist after improvement in mood, then a repeat neuropsychological evaluation should be considered, perhaps in one year.   11/23/2014 initiial visit: Judith Schroeder is a 72 y.o. female here as a referral from Dr. Willey Blade for cognitive changes. PMHx of HTN, HLD, Aotic valve disorder s/p replacement. This past summer she noticed that at times ahe would get distracted and her mind would wander. For example, even playing golf she would get distracted and think of a tax return. She is under a lot of stress, mother is 14 and calling her every day. She loses things, was going to bring a folder today and couldn't find it. She is forgetting appointments, she has to keep them documented. Lives alone. Independent. She pays bills on time, she keeps good records. She is paying her mother's bills. She can't spell, this is chronic. She is losing more recent memory, her memory "goes for a split second" and she has to think hard to remember what she was going into a room to do or where she put something. She thinks about something else and her mind switches over. No confusion or lapses of time or episodes of altered consciousness. She has episodes where she has a cold feeling over her body and she shivers. Memory problems "come and go". Not getting worse. Just happening more often. Father with parkinson's disease. Father with dementia.  Fatigued, some days she wants to sleep all days and other days it is fine. She sleeps well. Doesn't know if she snores. She takes her fluid pills at night and gets up at night to urinate but doesn't bother her. She can't take fluid pilld during the day or she wets her pants, she can't urinate a lot like when she is playing golf. She is still social, plays golf. No focal neurologic symptoms.    Reviewed notes, labs and imaging from outside physicians, which showed: HgbA1c 5.6 cbc/cmp  unremarkable  Review of Systems: Patient complains of symptoms per HPI as well as the following symptoms: agitation, confusion, memory loss, confusion, depression, nervous, hallucinations. Pertinent negatives per HPI. All others negative.    Social History   Socioeconomic History  . Marital status: Widowed    Spouse name: Not on file  . Number of children: 1  . Years of education: 12+  . Highest education level: Not on file  Occupational History  . Occupation: Retired  Scientific laboratory technician  . Financial resource strain: Not on file  . Food insecurity:    Worry: Not on file    Inability: Not on file  . Transportation needs:    Medical: Not on file    Non-medical: Not on file  Tobacco Use  . Smoking status: Never Smoker  . Smokeless tobacco: Never Used  Substance and Sexual Activity  . Alcohol use: No    Alcohol/week: 0.0 standard drinks  . Drug use: No  . Sexual activity: Not on file  Lifestyle  . Physical activity:    Days per week: Not on file    Minutes per session: Not on file  . Stress: Not on file  Relationships  . Social connections:    Talks on phone: Not on file    Gets together: Not on file    Attends religious service: Not on file    Active member of club or organization: Not on file    Attends meetings of clubs or organizations: Not on file    Relationship status: Not on file  . Intimate partner violence:    Fear of current or ex partner: Not on file    Emotionally abused: Not on file    Physically abused: Not on file    Forced sexual activity: Not on file  Other Topics Concern  . Not on file  Social History Narrative   Patient lives at home alone.    Patient is widowed.    Patient has 1 child   Patient has 2 years of college   Patient is right handed    Caffeine use: 2-3 glasses diet green tea per day   No soda     Family History  Problem Relation Age of Onset  . Hypertension Mother   . Cancer Father        head and neck     Past Medical  History:  Diagnosis Date  . Carpal tunnel syndrome   . Depression   . H/O aortic valve replacement   . High blood pressure   . High cholesterol   . Pleural effusion   . Shingles 2019   L eye    Past Surgical History:  Procedure Laterality Date  . AORTIC VALVE REPLACEMENT  2012  . EMPYEMA DRAINAGE  2017  . TUBAL LIGATION    . VIDEO ASSISTED THORACOSCOPY (VATS)/EMPYEMA Right 09/07/2016   Procedure: VIDEO ASSISTED THORACOSCOPY (VATS)/EMPYEMA;  Surgeon: Gaye Pollack, MD;  Location: [facility];  Service: Thoracic;  Laterality: Right;    Current Outpatient Medications  Medication Sig Dispense Refill  . aspirin EC 81 MG tablet Take 81 mg by mouth daily.     Marland Kitchen donepezil (ARICEPT) 10 MG tablet TAKE 1 TABLET AT BEDTIME 30 tablet 9  . eplerenone (INSPRA) 25 MG tablet Take 25 mg by mouth daily.    . memantine (NAMENDA) 10 MG tablet TAKE (1) TABLET BY MOUTH TWICE DAILY. 60 tablet 0  . metoprolol succinate (TOPROL-XL) 25 MG 24 hr tablet Take 25 mg by mouth daily.    . prednisoLONE acetate (PRED FORTE) 1 % ophthalmic suspension Place 1 drop into the left eye 4 (four) times daily.  0  . PRESCRIPTION MEDICATION Place 1 drop into the left eye at bedtime. Cyclogyl drop L eye. Wash hands after.    . rosuvastatin (CRESTOR) 5 MG tablet Take 5 mg by mouth daily.    . sertraline (ZOLOFT) 25 MG tablet Take 25 mg by mouth daily.    Marland Kitchen acetaminophen (TYLENOL) 500 MG tablet Take 2 tablets (1,000 mg total) by mouth every 6 (six) hours as needed for mild pain or fever. 30 tablet 0   No current facility-administered medications for this visit.     Allergies as of 09/23/2018 - Review Complete 09/23/2018  Allergen Reaction Noted  . Atorvastatin Other (See Comments) 09/11/2015    Vitals: Ht 5\' 3"  (1.6 m)   Wt 210 lb (95.3 kg)   BMI 37.20 kg/m  Last Weight:  Wt Readings from Last 1 Encounters:  09/23/18 210 lb (95.3 kg)   Last Height:   Ht Readings from Last 1 Encounters:  09/23/18 5\' 3"  (1.6 m)     Physical exam: Exam: Gen: NAD, conversant, well nourised, obese, well groomed                      Neuro: Detailed Neurologic Exam  Speech:    Speech is normal; fluent and spontaneous  Cranial Nerves:    Left pupil irregular and not reactive. Visual fields are full to finger confrontation. Extraocular movements are intact. Trigeminal sensation is intact and the muscles of mastication are normal. The face is symmetric. The palate elevates in the midline. Hearing intact. Voice is normal. Shoulder shrug is normal. The tongue has normal motion without fasciculations.   Motor Observation:    No asymmetry, no atrophy, and no involuntary movements noted. Tone:    Normal muscle tone.    Posture:    Posture is normal. normal erect    Strength:    Strength is V/V in the upper and lower limbs.       MMSE - Mini Mental State Exam 08/12/2017 02/04/2017 07/30/2016  Orientation to time 2 1 2   Orientation to Place 4 3 3   Registration 3 3 3   Attention/ Calculation 5 4 5   Recall 0 2 1  Language- name 2 objects 2 2 2   Language- repeat 1 1 1   Language- follow 3 step command 3 3 3   Language- read & follow direction 1 1 1   Write a sentence 1 1 1   Copy design 1 1 1   Total score 23 22 23       Assessment/Plan: Judith Schroeder is a 72y.o. female here as a referral from Dr. Willey Blade for Dementia, diagnosed  with mild dementia on neurocognitive testing in 2018. PMHx of HTN, HLD, Aortic valve disorder s/p replacement. Here with her daughter.   - Patient has declined this year. At  last appointment  In 2018she was still very functional and able to make decisions about her healthcare and finances. But today's appointment reveals significant decline and at this point feel patient does not have the capacity to make decisions about finances and health.  - Will refer to Hillery Hunter for her left irregular and unresponsive pupillary reflex with vision loss in the left eye; likely due to shingles  infection but will send for evaluation.  - MRI of the brain and MRA of the head due to abnormal pupillary reflex and vision changes/loss in the left eye to eval for strokes, compressive masses, aneurysms, lesions or otherwise.   Today's history and physical demonstrated very substantial and measurable cognitive losses consistent with moderate to severe dementia. Based on the prior experiences in the community and the substantial degree of impairment it is clear that she does not have the capacity to make informed and appropriate decisions on her healthcare and finances. I do recommend that she lives in a structured setting. It is also clear that patient does not comprehend the degree of cognitive losses or the risks this poses. On this basis I feel it is necessary to appoint a formal guardian of patient's person and financial affairs and someone who can continue to look out for the best interests of patient who is suffering from substantial cognitive impairment due to dementia.   Past workup:   b12 and folate nml tsh nml Unremarkable MRI of the brain FDG PET scan: Normal. Neurocognitive testing 04/2017: Mild dementia Discussed Clinical trials, they decline at this time Discussed POA and HCPOA and assisted living Continue Aricept and Namenda No driving I am concerned about her living alone, daughter lives next door and is there often but II feel they need to consider different living situations, also discussed day centers in the area.  Cc: Dr. Cecille Aver, MD  [facility] [address] [place], [place] [address]  Phone [phone] Fax [phone] Sarina Ill, MD  [facility] [address] [place], [place] [address]  Phone [phone] Fax [phone]  A total of 40 minutes was spent face-to-face with this patient. Over half this time was spent on counseling patient on the  1. Late onset  Alzheimer's disease with behavioral disturbance ([place])   2. Dysuria   3. Urinary incontinence without sensory awareness   4. Pupil irregular of left eye   5. Vision loss, left eye    diagnosis and different diagnostic and therapeutic options available.

## 2018-09-24 ENCOUNTER — Telehealth: Payer: Self-pay | Admitting: Neurology

## 2018-09-24 NOTE — Telephone Encounter (Signed)
Left voicemail for patient to be aware of this. And if she hasn't heard in the next 2-3 business days to give them a call at [phone]

## 2018-09-24 NOTE — Telephone Encounter (Signed)
Medicare/bcbs supp order sent to [facility]. No auth they will reach out to the pt to schedule.  °

## 2018-09-24 NOTE — Telephone Encounter (Signed)
Letter signed by Dr. Jaynee Eagles and mailed to Judith Schroeder @ address given by her.

## 2018-09-26 LAB — URINE CULTURE

## 2018-09-29 ENCOUNTER — Other Ambulatory Visit: Payer: Self-pay | Admitting: Neurology

## 2018-09-29 MED ORDER — LORAZEPAM 0.5 MG PO TABS: ORAL_TABLET | ORAL | 4 refills | Status: DC

## 2018-09-30 ENCOUNTER — Telehealth: Payer: Self-pay

## 2018-09-30 LAB — URINALYSIS, ROUTINE W REFLEX MICROSCOPIC

## 2018-09-30 LAB — COMPREHENSIVE METABOLIC PANEL
ALBUMIN: 4.4 g/dL (ref 3.5–4.8)
ALT: 23 IU/L (ref 0–32)
AST: 23 IU/L (ref 0–40)
Albumin/Globulin Ratio: 1.9 (ref 1.2–2.2)
Alkaline Phosphatase: 103 IU/L (ref 39–117)
BILIRUBIN TOTAL: 2.2 mg/dL — AB (ref 0.0–1.2)
BUN / CREAT RATIO: 13 (ref 12–28)
BUN: 12 mg/dL (ref 8–27)
CHLORIDE: 101 mmol/L (ref 96–106)
CO2: 22 mmol/L (ref 20–29)
Calcium: 9.6 mg/dL (ref 8.7–10.3)
Creatinine, Ser: 0.92 mg/dL (ref 0.57–1.00)
GFR calc non Af Amer: 62 mL/min/{1.73_m2} (ref >59)
GFR, EST AFRICAN AMERICAN: 72 mL/min/{1.73_m2} (ref >59)
GLUCOSE: 93 mg/dL (ref 65–99)
Globulin, Total: 2.3 g/dL (ref 1.5–4.5)
Potassium: 4.9 mmol/L (ref 3.5–5.2)
Sodium: 141 mmol/L (ref 134–144)
TOTAL PROTEIN: 6.7 g/dL (ref 6.0–8.5)

## 2018-09-30 LAB — CBC WITH DIFFERENTIAL/PLATELET
BASOS ABS: 0.1 10*3/uL (ref 0.0–0.2)
Basos: 1 %
EOS (ABSOLUTE): 0.2 10*3/uL (ref 0.0–0.4)
EOS: 2 %
HEMATOCRIT: 43.4 % (ref 34.0–46.6)
HEMOGLOBIN: 14.8 g/dL (ref 11.1–15.9)
Immature Grans (Abs): 0 10*3/uL (ref 0.0–0.1)
Immature Granulocytes: 0 %
LYMPHS ABS: 1.6 10*3/uL (ref 0.7–3.1)
Lymphs: 18 %
MCH: 31.6 pg (ref 26.6–33.0)
MCHC: 34.1 g/dL (ref 31.5–35.7)
MCV: 93 fL (ref 79–97)
MONOCYTES: 7 %
Monocytes Absolute: 0.6 10*3/uL (ref 0.1–0.9)
NEUTROS ABS: 6.6 10*3/uL (ref 1.4–7.0)
Neutrophils: 72 %
PLATELETS: 224 10*3/uL (ref 150–450)
RBC: 4.68 x10E6/uL (ref 3.77–5.28)
RDW: 11.7 % — ABNORMAL LOW (ref 12.3–15.4)
WBC: 9 10*3/uL (ref 3.4–10.8)

## 2018-09-30 LAB — SEDIMENTATION RATE: Sed Rate: 6 mm/hr (ref 0–40)

## 2018-09-30 LAB — C-REACTIVE PROTEIN: CRP: <1 mg/L (ref 0–10)

## 2018-09-30 NOTE — Telephone Encounter (Signed)
We received a prior authorization request for the Ativan 0.5mg . I have completed and submitted the PA on Cover My Meds and should have a determination within 48-72 hours.  Cover My Meds Key: [id number] - Rx #: [id number]

## 2018-09-30 NOTE — Telephone Encounter (Signed)
Approved. Effective from 09/30/2018 through 10/01/2019.

## 2018-10-04 ENCOUNTER — Ambulatory Visit
Admission: RE | Admit: 2018-10-04 | Discharge: 2018-10-04 | Disposition: A | Discharge status: Home / Self Care | Payer: Medicare Other | Source: Ambulatory Visit | Attending: Neurology | Admitting: Neurology

## 2018-10-04 DIAGNOSIS — I671 Cerebral aneurysm, nonruptured: Secondary | ICD-10-CM

## 2018-10-04 DIAGNOSIS — G529 Cranial nerve disorder, unspecified: Secondary | ICD-10-CM

## 2018-10-04 DIAGNOSIS — H21562 Pupillary abnormality, left eye: Secondary | ICD-10-CM | POA: Diagnosis not present

## 2018-10-04 DIAGNOSIS — H5462 Unqualified visual loss, left eye, normal vision right eye: Secondary | ICD-10-CM | POA: Diagnosis not present

## 2018-10-04 MED ORDER — GADOBENATE DIMEGLUMINE 529 MG/ML IV SOLN
20.0000 mL | Freq: Once | INTRAVENOUS | Status: AC | PRN
  Administered 2018-10-04: 20 mL via INTRAVENOUS

## 2018-10-08 DIAGNOSIS — B0230 Zoster ocular disease, unspecified: Secondary | ICD-10-CM | POA: Diagnosis not present

## 2018-10-12 DIAGNOSIS — G301 Alzheimer's disease with late onset: Secondary | ICD-10-CM | POA: Diagnosis not present

## 2018-10-12 DIAGNOSIS — I10 Essential (primary) hypertension: Secondary | ICD-10-CM | POA: Diagnosis not present

## 2018-10-12 DIAGNOSIS — E782 Mixed hyperlipidemia: Secondary | ICD-10-CM | POA: Diagnosis not present

## 2018-10-12 DIAGNOSIS — I359 Nonrheumatic aortic valve disorder, unspecified: Secondary | ICD-10-CM | POA: Diagnosis not present

## 2018-10-12 DIAGNOSIS — F0280 Dementia in other diseases classified elsewhere without behavioral disturbance: Secondary | ICD-10-CM | POA: Diagnosis not present

## 2018-10-12 DIAGNOSIS — N183 Chronic kidney disease, stage 3 (moderate): Secondary | ICD-10-CM | POA: Diagnosis not present

## 2018-10-15 ENCOUNTER — Other Ambulatory Visit: Payer: Self-pay | Admitting: Neurology

## 2018-10-16 HISTORY — PX: APPLICATION OF WOUND VAC: SHX5189

## 2018-10-23 DIAGNOSIS — G300 Alzheimer's disease with early onset: Secondary | ICD-10-CM | POA: Diagnosis not present

## 2018-10-23 DIAGNOSIS — Z23 Encounter for immunization: Secondary | ICD-10-CM | POA: Diagnosis not present

## 2018-10-23 DIAGNOSIS — B0229 Other postherpetic nervous system involvement: Secondary | ICD-10-CM | POA: Diagnosis not present

## 2018-11-08 ENCOUNTER — Emergency Department (HOSPITAL_COMMUNITY): Payer: Medicare Other

## 2018-11-08 ENCOUNTER — Other Ambulatory Visit: Payer: Self-pay

## 2018-11-08 ENCOUNTER — Emergency Department (HOSPITAL_COMMUNITY)
Admission: EM | Admit: 2018-11-08 | Discharge: 2018-11-08 | Disposition: A | Discharge status: Home / Self Care | Payer: Medicare Other | Attending: Emergency Medicine | Admitting: Emergency Medicine

## 2018-11-08 ENCOUNTER — Encounter (HOSPITAL_COMMUNITY): Payer: Self-pay | Admitting: Emergency Medicine

## 2018-11-08 DIAGNOSIS — G309 Alzheimer's disease, unspecified: Secondary | ICD-10-CM | POA: Diagnosis not present

## 2018-11-08 DIAGNOSIS — L03116 Cellulitis of left lower limb: Secondary | ICD-10-CM

## 2018-11-08 DIAGNOSIS — Z7982 Long term (current) use of aspirin: Secondary | ICD-10-CM | POA: Diagnosis not present

## 2018-11-08 DIAGNOSIS — F0280 Dementia in other diseases classified elsewhere without behavioral disturbance: Secondary | ICD-10-CM | POA: Insufficient documentation

## 2018-11-08 DIAGNOSIS — M79662 Pain in left lower leg: Secondary | ICD-10-CM | POA: Diagnosis not present

## 2018-11-08 DIAGNOSIS — F0390 Unspecified dementia without behavioral disturbance: Secondary | ICD-10-CM | POA: Diagnosis not present

## 2018-11-08 DIAGNOSIS — Z79899 Other long term (current) drug therapy: Secondary | ICD-10-CM | POA: Diagnosis not present

## 2018-11-08 DIAGNOSIS — M7989 Other specified soft tissue disorders: Secondary | ICD-10-CM | POA: Diagnosis not present

## 2018-11-08 DIAGNOSIS — L02416 Cutaneous abscess of left lower limb: Secondary | ICD-10-CM | POA: Insufficient documentation

## 2018-11-08 DIAGNOSIS — R030 Elevated blood-pressure reading, without diagnosis of hypertension: Secondary | ICD-10-CM | POA: Diagnosis not present

## 2018-11-08 HISTORY — DX: Unspecified dementia without behavioral disturbance: F03.90

## 2018-11-08 LAB — CBC WITH DIFFERENTIAL/PLATELET
ABS IMMATURE GRANULOCYTES: 0.02 10*3/uL (ref 0.00–0.07)
BASOS PCT: 1 %
Basophils Absolute: 0.1 10*3/uL (ref 0.0–0.1)
Eosinophils Absolute: 0.2 10*3/uL (ref 0.0–0.5)
Eosinophils Relative: 3 %
HCT: 43.8 % (ref 36.0–46.0)
Hemoglobin: 14 g/dL (ref 12.0–15.0)
IMMATURE GRANULOCYTES: 0 %
LYMPHS ABS: 1.6 10*3/uL (ref 0.7–4.0)
Lymphocytes Relative: 20 %
MCH: 31.5 pg (ref 26.0–34.0)
MCHC: 32 g/dL (ref 30.0–36.0)
MCV: 98.6 fL (ref 80.0–100.0)
MONOS PCT: 7 %
Monocytes Absolute: 0.6 10*3/uL (ref 0.1–1.0)
NEUTROS ABS: 5.7 10*3/uL (ref 1.7–7.7)
NEUTROS PCT: 69 %
Platelets: 203 10*3/uL (ref 150–400)
RBC: 4.44 MIL/uL (ref 3.87–5.11)
RDW: 13.1 % (ref 11.5–15.5)
WBC: 8.1 10*3/uL (ref 4.0–10.5)
nRBC: 0 % (ref 0.0–0.2)

## 2018-11-08 LAB — BASIC METABOLIC PANEL
ANION GAP: 6 (ref 5–15)
BUN: 16 mg/dL (ref 8–23)
CHLORIDE: 107 mmol/L (ref 98–111)
CO2: 27 mmol/L (ref 22–32)
Calcium: 8.8 mg/dL — ABNORMAL LOW (ref 8.9–10.3)
Creatinine, Ser: 0.82 mg/dL (ref 0.44–1.00)
Glucose, Bld: 101 mg/dL — ABNORMAL HIGH (ref 70–99)
POTASSIUM: 4.4 mmol/L (ref 3.5–5.1)
Sodium: 140 mmol/L (ref 135–145)

## 2018-11-08 LAB — PROTIME-INR
INR: 0.83
PROTHROMBIN TIME: 11.3 s — AB (ref 11.4–15.2)

## 2018-11-08 MED ORDER — CLINDAMYCIN HCL 150 MG PO CAPS: ORAL_CAPSULE | ORAL | 0 refills | Status: DC

## 2018-11-08 MED ORDER — LIDOCAINE HCL (PF) 2 % IJ SOLN
INTRAMUSCULAR | Status: AC
  Administered 2018-11-08: 5 mL via INTRADERMAL
  Filled 2018-11-08: qty 10

## 2018-11-08 MED ORDER — LIDOCAINE HCL (PF) 2 % IJ SOLN
5.0000 mL | Freq: Once | INTRAMUSCULAR | Status: AC
  Administered 2018-11-08: 5 mL via INTRADERMAL

## 2018-11-08 MED ORDER — CLINDAMYCIN HCL 150 MG PO CAPS
450.0000 mg | ORAL_CAPSULE | Freq: Once | ORAL | Status: AC
  Administered 2018-11-08: 450 mg via ORAL
  Filled 2018-11-08: qty 3

## 2018-11-08 NOTE — ED Triage Notes (Signed)
Pt c/o ulcer to LLL, dtr reports pt has dementia and they are unsure when it began but noticed it last night, pt reports pain, swelling, redness and drainage, denies fever and injury

## 2018-11-08 NOTE — ED Provider Notes (Signed)
  Pt with dementia, redness, swelling and slight drainage localized to left lower leg.  No fever, chills or known injury.    I was asked by Dr. Thurnell Garbe to perform an I&D procedure to abscess of posterior left lower leg.  This is my only involvement in this patient's care.     INCISION AND DRAINAGE Performed by: Conswella Bruney Consent: Verbal consent obtained. Risks and benefits: risks, benefits and alternatives were discussed Type: abscess  Body area: left lower leg  Anesthesia: local infiltration  Incision was made with a #11 scalpel.  Local anesthetic: lidocaine 1% w/o epinephrine  Anesthetic total: 3  ml  Complexity: complex Blunt dissection to break up loculations  Drainage: purulent  Drainage amount: small  Packing material: none  Patient tolerance: Patient tolerated the procedure well with no immediate complications.   Dressing applied.  Pt and family member at bedside agree to warm water soaks, elevation of the extremity   Kem Parkinson, PA-C 11/08/18 [place], [employer], DO 11/12/18 1611

## 2018-11-08 NOTE — ED Provider Notes (Signed)
[facility] EMERGENCY DEPARTMENT Provider Note   CSN: [id number] Arrival date & time: 11/08/18  1431     History   Chief Complaint Chief Complaint  Patient presents with  . Abscess    HPI Judith Schroeder is a 72 y.o. female.  The history is provided by a relative and the patient. The history is limited by the condition of the patient (hx dementia).  Abscess  Pt was seen at 1545. Per pt and her family: Pt's family noticed last night tha pt had abscess on back of left lower leg; unknown for how long it has been present. Pt had cloth wrapped around it and told family it was because it was draining. Family feels today area has become more "red," "swollen," with "drainage." Pt herself has significant hx of dementia.   Past Medical History:  Diagnosis Date  . Carpal tunnel syndrome   . Dementia ([place])   . Depression   . H/O aortic valve replacement   . High blood pressure   . High cholesterol   . Pleural effusion   . Shingles 2019   L eye    Patient Active Problem List   Diagnosis Date Noted  . Empyema ([place]) 09/07/2016  . Pleural effusion 09/05/2016  . Alzheimer's dementia, late onset, with behavioral disturbance ([place]) 09/05/2016  . MCI (mild cognitive impairment) with memory loss 07/31/2016  . Cognitive changes 11/23/2014  . Fatigue 11/23/2014  . Cold feeling 11/23/2014    Past Surgical History:  Procedure Laterality Date  . AORTIC VALVE REPLACEMENT  2012  . EMPYEMA DRAINAGE  2017  . TUBAL LIGATION    . VIDEO ASSISTED THORACOSCOPY (VATS)/EMPYEMA Right 09/07/2016   Procedure: VIDEO ASSISTED THORACOSCOPY (VATS)/EMPYEMA;  Surgeon: Gaye Pollack, MD;  Location: [facility] OR;  Service: Thoracic;  Laterality: Right;     OB History   None      Home Medications    Prior to Admission medications   Medication Sig Start Date End Date Taking? Authorizing Provider  acetaminophen (TYLENOL) 500 MG tablet Take 2 tablets (1,000 mg total) by mouth every 6 (six) hours as needed for  mild pain or fever. 09/12/16   Barrett, Lodema Hong, PA-C  aspirin EC 81 MG tablet Take 81 mg by mouth daily.     [provider]  donepezil (ARICEPT) 10 MG tablet TAKE 1 TABLET AT BEDTIME 12/03/17   Melvenia Beam, MD  eplerenone (INSPRA) 25 MG tablet Take 25 mg by mouth daily.    [provider]  LORazepam (ATIVAN) 0.5 MG tablet 1 tab every 8 hours as needed for agitation/anxiety that cannot be redirected. Watch for sedation and risk of falls. 09/29/18   Melvenia Beam, MD  memantine (NAMENDA) 10 MG tablet TAKE (1) TABLET BY MOUTH TWICE DAILY. 10/15/18   Melvenia Beam, MD  metoprolol succinate (TOPROL-XL) 25 MG 24 hr tablet Take 25 mg by mouth daily.    [provider]  prednisoLONE acetate (PRED FORTE) 1 % ophthalmic suspension Place 1 drop into the left eye 4 (four) times daily. 09/14/18   [provider]  PRESCRIPTION MEDICATION Place 1 drop into the left eye at bedtime. Cyclogyl drop L eye. Wash hands after.    [provider]  rosuvastatin (CRESTOR) 5 MG tablet Take 5 mg by mouth daily.    [provider]  sertraline (ZOLOFT) 25 MG tablet Take 25 mg by mouth daily.    [provider]    Family History Family History  Problem Relation Age of Onset  . Hypertension Mother   . Cancer Father        head and neck     Social History Social History   Tobacco Use  . Smoking status: Never Smoker  . Smokeless tobacco: Never Used  Substance Use Topics  . Alcohol use: No    Alcohol/week: 0.0 standard drinks  . Drug use: No     Allergies   Atorvastatin   Review of Systems Review of Systems  Unable to perform ROS: Dementia     Physical Exam Updated Vital Signs BP (!) 183/63 (BP Location: Left Arm)   Pulse 66   Temp 97.9 F (36.6 C) (Oral)   Resp 17   Ht 5\' 2"  (1.575 m)   Wt 96.6 kg   SpO2 98%   BMI 38.96 kg/m   Physical Exam 1550: Physical examination:  Nursing notes reviewed; Vital signs and O2 SAT  reviewed;  Constitutional: Well developed, Well nourished, Well hydrated, In no acute distress; Head:  Normocephalic, atraumatic; Eyes: EOMI, PERRL, No scleral icterus; ENMT: Mouth and pharynx normal, Mucous membranes moist; Neck: Supple, Full range of motion, No lymphadenopathy; Cardiovascular: Regular rate and rhythm, No gallop; Respiratory: Breath sounds clear & equal bilaterally, No wheezes. Speaking full sentences with ease, Normal respiratory effort/excursion; Chest: Nontender, Movement normal; Abdomen: Soft, Nontender, Nondistended, Normal bowel sounds; Genitourinary: No CVA tenderness; Extremities: Peripheral pulses normal, +posterior lower leg with approximately 2-3cm diameter area of fluctuance with purulent drainage from 2 small open areas centrally, mild surrounding erythema, no streaking, no soft tissue crepitus. No deformity, No edema, No calf edema or asymmetry.; Neuro: Awake, alert, confused per hx dementia. No facial droop. Speech clear. No gross focal motor deficits in extremities.; Skin: Color normal, Warm, Dry.   ED Treatments / Results  Labs (all labs ordered are listed, but only abnormal results are displayed)   EKG None  Radiology   Procedures Procedures (including critical care time)  Medications Ordered in ED Medications  lidocaine (XYLOCAINE) 2 % injection 5 mL (has no administration in time range)  lidocaine (XYLOCAINE) 2 % injection (has no administration in time range)     Initial Impression / Assessment and Plan / ED Course  I have reviewed the triage vital signs and the nursing notes.  Pertinent labs & imaging results that were available during my care of the patient were reviewed by me and considered in my medical decision making (see chart for details).  MDM Reviewed: previous chart, nursing note and vitals Reviewed previous: labs Interpretation: labs and x-ray    Results for orders placed or performed during the hospital encounter of 95/63/87    Basic metabolic panel  Result Value Ref Range   Sodium 140 135 - 145 mmol/L   Potassium 4.4 3.5 - 5.1 mmol/L   Chloride 107 98 - 111 mmol/L   CO2 27 22 - 32 mmol/L   Glucose, Bld 101 (H) 70 - 99 mg/dL   BUN 16 8 - 23 mg/dL   Creatinine, Ser 0.82 0.44 - 1.00 mg/dL   Calcium 8.8 (L) 8.9 - 10.3 mg/dL   GFR calc non Af Amer >60 >60 mL/min   GFR calc Af Amer >60 >60 mL/min   Anion gap 6 5 - 15  CBC with Differential  Result Value Ref Range   WBC 8.1 4.0 - 10.5 K/uL   RBC 4.44 3.87 - 5.11 MIL/uL   Hemoglobin 14.0 12.0 - 15.0 g/dL   HCT 43.8 36.0 -  46.0 %   MCV 98.6 80.0 - 100.0 fL   MCH 31.5 26.0 - 34.0 pg   MCHC 32.0 30.0 - 36.0 g/dL   RDW 13.1 11.5 - 15.5 %   Platelets 203 150 - 400 K/uL   nRBC 0.0 0.0 - 0.2 %   Neutrophils Relative % 69 %   Neutro Abs 5.7 1.7 - 7.7 K/uL   Lymphocytes Relative 20 %   Lymphs Abs 1.6 0.7 - 4.0 K/uL   Monocytes Relative 7 %   Monocytes Absolute 0.6 0.1 - 1.0 K/uL   Eosinophils Relative 3 %   Eosinophils Absolute 0.2 0.0 - 0.5 K/uL   Basophils Relative 1 %   Basophils Absolute 0.1 0.0 - 0.1 K/uL   Immature Granulocytes 0 %   Abs Immature Granulocytes 0.02 0.00 - 0.07 K/uL  Protime-INR  Result Value Ref Range   Prothrombin Time 11.3 (L) 11.4 - 15.2 seconds   INR 0.83    Dg Tibia/fibula Left Result Date: 11/08/2018 CLINICAL DATA:  Pain, swelling, redness and drainage. EXAM: LEFT TIBIA AND FIBULA - 2 VIEW COMPARISON:  None. FINDINGS: There is no evidence of fracture or other focal bone lesions. Degenerative changes included knee. Faint dermal calcifications. No subcutaneous gas or radiopaque foreign bodies. IMPRESSION: No acute osseous process. Electronically Signed   By: Elon Alas M.D.   On: 11/08/2018 17:34    1800:  I&D performed (see separate note). Workup otherwise reassuring. Tx abx, warm soaks, f/u PMD in the next 2 days. Dx and testing d/w pt and family.  Questions answered.  Verb understanding, agreeable to d/c home with outpt  f/u.   Final Clinical Impressions(s) / ED Diagnoses   Final diagnoses:  None    ED Discharge Orders    None       Francine Graven, DO 11/12/18 1610

## 2018-11-08 NOTE — Discharge Instructions (Addendum)
Take the prescription as directed. Encourage site to drain with frequent warm water soaks. Wash the area gently with soap and water, and pat dry, at least twice a day, and cover with a clean/dry dressing. Change the dressing whenever it becomes wet or soiled after washing the area with soap and water and patting dry.  Call your regular medical doctor tomorrow to schedule a follow up appointment for a recheck within the next 2 days.  Return to the Emergency Department immediately if worsening.

## 2018-11-08 NOTE — ED Notes (Signed)
Dressing applied to wound.  

## 2018-11-16 ENCOUNTER — Other Ambulatory Visit: Payer: Self-pay | Admitting: Neurology

## 2018-11-16 DIAGNOSIS — L03116 Cellulitis of left lower limb: Secondary | ICD-10-CM | POA: Diagnosis not present

## 2018-11-20 DIAGNOSIS — B0230 Zoster ocular disease, unspecified: Secondary | ICD-10-CM | POA: Diagnosis not present

## 2018-11-25 ENCOUNTER — Emergency Department (HOSPITAL_COMMUNITY)
Admission: EM | Admit: 2018-11-25 | Discharge: 2018-11-25 | Disposition: A | Discharge status: Home / Self Care | Payer: Medicare Other | Attending: Emergency Medicine | Admitting: Emergency Medicine

## 2018-11-25 ENCOUNTER — Other Ambulatory Visit: Payer: Self-pay

## 2018-11-25 ENCOUNTER — Encounter (HOSPITAL_COMMUNITY): Payer: Self-pay | Admitting: Emergency Medicine

## 2018-11-25 DIAGNOSIS — G309 Alzheimer's disease, unspecified: Secondary | ICD-10-CM | POA: Diagnosis not present

## 2018-11-25 DIAGNOSIS — Y929 Unspecified place or not applicable: Secondary | ICD-10-CM | POA: Insufficient documentation

## 2018-11-25 DIAGNOSIS — F0280 Dementia in other diseases classified elsewhere without behavioral disturbance: Secondary | ICD-10-CM | POA: Insufficient documentation

## 2018-11-25 DIAGNOSIS — Y998 Other external cause status: Secondary | ICD-10-CM | POA: Insufficient documentation

## 2018-11-25 DIAGNOSIS — F329 Major depressive disorder, single episode, unspecified: Secondary | ICD-10-CM | POA: Diagnosis not present

## 2018-11-25 DIAGNOSIS — Y9389 Activity, other specified: Secondary | ICD-10-CM | POA: Diagnosis not present

## 2018-11-25 DIAGNOSIS — W0110XA Fall on same level from slipping, tripping and stumbling with subsequent striking against unspecified object, initial encounter: Secondary | ICD-10-CM | POA: Insufficient documentation

## 2018-11-25 DIAGNOSIS — S61511A Laceration without foreign body of right wrist, initial encounter: Secondary | ICD-10-CM | POA: Diagnosis not present

## 2018-11-25 DIAGNOSIS — Z952 Presence of prosthetic heart valve: Secondary | ICD-10-CM | POA: Diagnosis not present

## 2018-11-25 DIAGNOSIS — Z7982 Long term (current) use of aspirin: Secondary | ICD-10-CM | POA: Diagnosis not present

## 2018-11-25 DIAGNOSIS — R030 Elevated blood-pressure reading, without diagnosis of hypertension: Secondary | ICD-10-CM | POA: Diagnosis not present

## 2018-11-25 DIAGNOSIS — Z79899 Other long term (current) drug therapy: Secondary | ICD-10-CM | POA: Diagnosis not present

## 2018-11-25 DIAGNOSIS — S61521A Laceration with foreign body of right wrist, initial encounter: Secondary | ICD-10-CM | POA: Diagnosis not present

## 2018-11-25 MED ORDER — CLINDAMYCIN HCL 300 MG PO CAPS: 300.0000 mg | ORAL_CAPSULE | Freq: Three times a day (TID) | ORAL | 0 refills | Status: DC

## 2018-11-25 MED ORDER — LIDOCAINE HCL (PF) 1 % IJ SOLN
10.0000 mL | Freq: Once | INTRAMUSCULAR | Status: DC
  Filled 2018-11-25: qty 10

## 2018-11-25 NOTE — ED Provider Notes (Signed)
[facility] EMERGENCY DEPARTMENT Provider Note   CSN: [id number] Arrival date & time: 11/25/18  0915     History   Chief Complaint Chief Complaint  Patient presents with  . Fall    HPI Judith Schroeder is a 72 y.o. female.  HPI   Judith Schroeder is a 72 y.o. female who presents to the Emergency Department complaining of two lacerations to right wrist secondary to a mechanical fall.  States that she tripped and fell causing the lacerations.  Pt unsure what she cut her wrist on.  Injuries occurred approximately 12 hrs prior to arrival.  She reports bleeding initially, that was controlled with direct pressure.  She denies pain, numbness or other injuries, including wrist pain,  head injury, neck pain, LOC, chest pain and dizziness.  Last td is up to date  Past Medical History:  Diagnosis Date  . Carpal tunnel syndrome   . Dementia ([place])   . Depression   . H/O aortic valve replacement   . High blood pressure   . High cholesterol   . Pleural effusion   . Shingles 2019   L eye    Patient Active Problem List   Diagnosis Date Noted  . Empyema ([place]) 09/07/2016  . Pleural effusion 09/05/2016  . Alzheimer's dementia, late onset, with behavioral disturbance ([place]) 09/05/2016  . MCI (mild cognitive impairment) with memory loss 07/31/2016  . Cognitive changes 11/23/2014  . Fatigue 11/23/2014  . Cold feeling 11/23/2014    Past Surgical History:  Procedure Laterality Date  . AORTIC VALVE REPLACEMENT  2012  . EMPYEMA DRAINAGE  2017  . TUBAL LIGATION    . VIDEO ASSISTED THORACOSCOPY (VATS)/EMPYEMA Right 09/07/2016   Procedure: VIDEO ASSISTED THORACOSCOPY (VATS)/EMPYEMA;  Surgeon: Gaye Pollack, MD;  Location: [facility] OR;  Service: Thoracic;  Laterality: Right;     OB History   None      Home Medications    Prior to Admission medications   Medication Sig Start Date End Date Taking? Authorizing Provider  aspirin EC 81 MG tablet Take 81 mg by mouth daily.    Yes [provider]  cholecalciferol (VITAMIN D3) 25 MCG (1000 UT) tablet Take 1,000 Units by mouth every morning.   Yes [provider]  donepezil (ARICEPT) 10 MG tablet Take 1 tablet (10 mg total) by mouth at bedtime. 11/16/18  Yes Melvenia Beam, MD  eplerenone (INSPRA) 25 MG tablet Take 25 mg by mouth every morning.    Yes [provider]  LORazepam (ATIVAN) 0.5 MG tablet 1 tab every 8 hours as needed for agitation/anxiety that cannot be redirected. Watch for sedation and risk of falls. Patient taking differently: Take 0.5 mg by mouth at bedtime. May take 1 tab every 8 hours as needed for agitation/anxiety that cannot be redirected. Watch for sedation and risk of falls. 09/29/18  Yes Melvenia Beam, MD  memantine (NAMENDA) 10 MG tablet TAKE (1) TABLET BY MOUTH TWICE DAILY. Patient taking differently: Take 10 mg by mouth 2 (two) times daily.  10/15/18  Yes Melvenia Beam, MD  metoprolol succinate (TOPROL-XL) 25 MG 24 hr tablet Take 25 mg by mouth every morning.    Yes [provider]  prednisoLONE acetate (PRED FORTE) 1 % ophthalmic suspension Place 1 drop into the left eye 4 (four) times daily. 09/14/18  Yes [provider]  rosuvastatin (CRESTOR) 5 MG tablet Take 5 mg by mouth every morning.    Yes [provider]  sertraline (ZOLOFT) 25 MG tablet Take 25 mg by mouth every morning.    Yes [provider]  valACYclovir (VALTREX) 1000 MG tablet Take 1 g by mouth every morning. 10/08/18  Yes [provider]  clindamycin (CLEOCIN) 150 MG capsule 3 tabs PO TID x 10 days Patient not taking: Reported on 11/25/2018 11/08/18   Francine Graven, DO    Family History Family History  Problem Relation Age of Onset  . Hypertension Mother   . Cancer Father        head and neck     Social History Social History   Tobacco Use  . Smoking status: Never Smoker  . Smokeless tobacco: Never Used  Substance Use Topics  . Alcohol use: No     Alcohol/week: 0.0 standard drinks  . Drug use: No     Allergies   Atorvastatin   Review of Systems Review of Systems  Constitutional: Negative for chills and fever.  Eyes: Negative for visual disturbance.  Respiratory: Negative for shortness of breath.   Cardiovascular: Negative for chest pain.  Gastrointestinal: Negative for nausea and vomiting.  Musculoskeletal: Negative for arthralgias, back pain, joint swelling and neck pain.  Skin: Positive for wound.       Two Laceration right wrist.  Neurological: Negative for dizziness, weakness, light-headedness, numbness and headaches.  Hematological: Does not bruise/bleed easily.  Psychiatric/Behavioral: Negative for confusion.     Physical Exam Updated Vital Signs BP 128/60   Pulse 66   Temp 97.9 F (36.6 C) (Oral)   Resp 18   Ht 5\' 2"  (1.575 m)   Wt 96.6 kg   SpO2 94%   BMI 38.95 kg/m   Physical Exam Vitals signs and nursing note reviewed.  Constitutional:      General: She is not in acute distress.    Appearance: Normal appearance. She is not ill-appearing.  HENT:     Head: Atraumatic.  Eyes:     Extraocular Movements: Extraocular movements intact.     Pupils: Pupils are equal, round, and reactive to light.  Neck:     Musculoskeletal: Normal range of motion and neck supple.  Cardiovascular:     Rate and Rhythm: Normal rate and regular rhythm.     Pulses: Normal pulses.  Pulmonary:     Effort: Pulmonary effort is normal.     Breath sounds: Normal breath sounds.  Chest:     Chest wall: No tenderness.  Musculoskeletal: Normal range of motion.        General: No swelling or tenderness.     Comments: Two lacerations to the right wrist.  No edema or active bleeding.  No FB's.  One lac to the dorsal wrist and one smaller, to the ulnar aspect.  Pt has full ROM of the right wrist and elbow.  Compartments soft.   Skin:    General: Skin is warm.     Capillary Refill: Capillary refill takes less than 2 seconds.    Neurological:     General: No focal deficit present.     Mental Status: She is alert.     GCS: GCS eye subscore is 4. GCS verbal subscore is 5. GCS motor subscore is 6.     Sensory: Sensation is intact. No sensory deficit.     Motor: Motor function is intact. No weakness.  Psychiatric:        Mood and Affect: Mood normal.     ED Treatments / Results  Labs (all labs ordered are listed,  but only abnormal results are displayed) Labs Reviewed - No data to display  EKG None  Radiology No results found.  Procedures Procedures (including critical care time)   LACERATION REPAIR #1 Performed by: Miaya Lafontant Authorized by: Yavonne Kiss Consent: Verbal consent obtained. Risks and benefits: risks, benefits and alternatives were discussed Consent given by: patient Patient identity confirmed: provided demographic data Prepped and Draped in normal sterile fashion Wound explored  Laceration Location: right wrist  Laceration Length: 5 cm  No Foreign Bodies seen or palpated  Anesthesia: local infiltration  Local anesthetic: lidocaine 2 % w/o epinephrine  Anesthetic total: 4 ml  Irrigation method: syringe Amount of cleaning: standard  Skin closure: 4-0 prolene  Number of sutures: 8  Technique: simple interrupted  Patient tolerance: Patient tolerated the procedure well with no immediate complications.  LACERATION REPAIR #2 Performed by: Zayda Angell Authorized by: Thomas Rhude Consent: Verbal consent obtained. Risks and benefits: risks, benefits and alternatives were discussed Consent given by: patient Patient identity confirmed: provided demographic data Prepped and Draped in normal sterile fashion Wound explored  Laceration Location: right wrist  Laceration Length: 1 cm  No Foreign Bodies seen or palpated  Anesthesia: local infiltration  Local anesthetic: lidocaine 2% w/o epinephrine  Anesthetic total: 1 ml  Irrigation method: syringe Amount  of cleaning: standard  Skin closure: 4-0 prolene  Number of sutures: 1  Technique: simple interrupted  Patient tolerance: Patient tolerated the procedure well with no immediate complications.    Medications Ordered in ED Medications  lidocaine (PF) (XYLOCAINE) 1 % injection 10 mL (has no administration in time range)     Initial Impression / Assessment and Plan / ED Course  I have reviewed the triage vital signs and the nursing notes.  Pertinent labs & imaging results that were available during my care of the patient were reviewed by me and considered in my medical decision making (see chart for details).     Td is up to date per patient,  NV intact.  Sutures loosely approximated.  No FB's, or injuries of deeper structures. No bony tenderness or motor deficits   Patient also seen by Dr. Lacinda Axon and care plan discussed.  Patient agrees to wound care instructions, sutures out in 8 to 10 days.  Return precautions were discussed.  Final Clinical Impressions(s) / ED Diagnoses   Final diagnoses:  Laceration of right wrist, initial encounter    ED Discharge Orders    None       Bufford Lope 11/27/18 2146    Nat Christen, MD 11/30/18 1152

## 2018-11-25 NOTE — Discharge Instructions (Signed)
Clean the wound with mild soap and water, keep it bandaged.  You may change the dressing once a day.  Sutures out in 8 to 10 days.  Return to the emergency room for any signs of infection such as increasing pain, redness, red streaks or drainage.

## 2018-11-25 NOTE — ED Notes (Signed)
Tammy, PA stitching patient's arm.

## 2018-11-25 NOTE — ED Triage Notes (Signed)
Patient states she took the dog outside last night and fell. Patient has two lacerations to right wrist and one laceration to lower left leg. Denies head injury or LOC.

## 2018-12-03 ENCOUNTER — Encounter (HOSPITAL_COMMUNITY): Payer: Self-pay

## 2018-12-03 ENCOUNTER — Other Ambulatory Visit: Payer: Self-pay

## 2018-12-03 ENCOUNTER — Emergency Department (HOSPITAL_COMMUNITY)
Admission: EM | Admit: 2018-12-03 | Discharge: 2018-12-03 | Disposition: A | Discharge status: Home / Self Care | Payer: Medicare Other | Attending: Emergency Medicine | Admitting: Emergency Medicine

## 2018-12-03 ENCOUNTER — Emergency Department (HOSPITAL_COMMUNITY): Payer: Medicare Other

## 2018-12-03 DIAGNOSIS — I10 Essential (primary) hypertension: Secondary | ICD-10-CM | POA: Diagnosis not present

## 2018-12-03 DIAGNOSIS — Z5189 Encounter for other specified aftercare: Secondary | ICD-10-CM

## 2018-12-03 DIAGNOSIS — R600 Localized edema: Secondary | ICD-10-CM | POA: Diagnosis not present

## 2018-12-03 DIAGNOSIS — G301 Alzheimer's disease with late onset: Secondary | ICD-10-CM | POA: Insufficient documentation

## 2018-12-03 DIAGNOSIS — Z79899 Other long term (current) drug therapy: Secondary | ICD-10-CM | POA: Insufficient documentation

## 2018-12-03 DIAGNOSIS — Z4801 Encounter for change or removal of surgical wound dressing: Secondary | ICD-10-CM | POA: Insufficient documentation

## 2018-12-03 DIAGNOSIS — Z4802 Encounter for removal of sutures: Secondary | ICD-10-CM | POA: Insufficient documentation

## 2018-12-03 DIAGNOSIS — Z7982 Long term (current) use of aspirin: Secondary | ICD-10-CM | POA: Insufficient documentation

## 2018-12-03 DIAGNOSIS — R2240 Localized swelling, mass and lump, unspecified lower limb: Secondary | ICD-10-CM | POA: Diagnosis not present

## 2018-12-03 DIAGNOSIS — R609 Edema, unspecified: Secondary | ICD-10-CM | POA: Diagnosis not present

## 2018-12-03 MED ORDER — DOXYCYCLINE HYCLATE 100 MG PO CAPS: 100.0000 mg | ORAL_CAPSULE | Freq: Two times a day (BID) | ORAL | 0 refills | Status: DC

## 2018-12-03 MED ORDER — DOXYCYCLINE HYCLATE 100 MG PO TABS
100.0000 mg | ORAL_TABLET | Freq: Once | ORAL | Status: AC
  Administered 2018-12-03: 100 mg via ORAL
  Filled 2018-12-03: qty 1

## 2018-12-03 NOTE — ED Triage Notes (Addendum)
Pt in for removal sutures in right wrist on 11/25/18 and re evaluation on abscess to left lower leg. Sitterconcerned about bruise to right leg and drainage from abscess that was opened 2 weeks ago on 11/08/18. Pt has been on 2 rounds of antibiotics

## 2018-12-03 NOTE — ED Provider Notes (Signed)
[facility] EMERGENCY DEPARTMENT Provider Note   CSN: [id number] Arrival date & time: 12/03/18  3086     History   Chief Complaint Chief Complaint  Patient presents with  . Suture / Staple Removal  . Abscess    HPI Judith Schroeder is a 72 y.o. female the past medical history as outlined below, most significant for hypertension and dementia, presenting with caregiver for suture removal.  Additionally there are complaints of continued purulent drainage from an abscess that was initially I indeed here on November 24.  She completed an entire course of clindamycin and then prescribed an additional 5 days by her PCP having completed this medication yesterday.  She denies pain at the site, dressing changes daily with a small amount of yellow discharge noted on the pad.  Caregiver states they have been using hot compresses several times daily at the site.  Patient has had no fevers or chills.  She has no other complaints.  Caregiver points out that she has had increased edema in her lower extremities with the right slightly more swollen than the left.  She was previously on a diuretic but this was discontinued 12 months ago by her cardiologist.  She denies shortness of breath, chest pain, orthopnea.   The history is provided by the patient and a caregiver.    Past Medical History:  Diagnosis Date  . Carpal tunnel syndrome   . Dementia ([place])   . Depression   . H/O aortic valve replacement   . High blood pressure   . High cholesterol   . Pleural effusion   . Shingles 2019   L eye    Patient Active Problem List   Diagnosis Date Noted  . Empyema ([place]) 09/07/2016  . Pleural effusion 09/05/2016  . Alzheimer's dementia, late onset, with behavioral disturbance ([place]) 09/05/2016  . MCI (mild cognitive impairment) with memory loss 07/31/2016  . Cognitive changes 11/23/2014  . Fatigue 11/23/2014  . Cold feeling 11/23/2014    Past Surgical History:  Procedure Laterality Date  . AORTIC  VALVE REPLACEMENT  2012  . EMPYEMA DRAINAGE  2017  . TUBAL LIGATION    . VIDEO ASSISTED THORACOSCOPY (VATS)/EMPYEMA Right 09/07/2016   Procedure: VIDEO ASSISTED THORACOSCOPY (VATS)/EMPYEMA;  Surgeon: Gaye Pollack, MD;  Location: [facility] OR;  Service: Thoracic;  Laterality: Right;     OB History   No obstetric history on file.      Home Medications    Prior to Admission medications   Medication Sig Start Date End Date Taking? Authorizing Provider  aspirin EC 81 MG tablet Take 81 mg by mouth daily.    Yes [provider]  cholecalciferol (VITAMIN D3) 25 MCG (1000 UT) tablet Take 1,000 Units by mouth every morning.   Yes [provider]  clindamycin (CLEOCIN) 300 MG capsule Take 1 capsule (300 mg total) by mouth 3 (three) times daily. 11/25/18  Yes Triplett, Tammy, PA-C  donepezil (ARICEPT) 10 MG tablet Take 1 tablet (10 mg total) by mouth at bedtime. 11/16/18  Yes Melvenia Beam, MD  eplerenone (INSPRA) 25 MG tablet Take 25 mg by mouth every morning.    Yes [provider]  LORazepam (ATIVAN) 0.5 MG tablet 1 tab every 8 hours as needed for agitation/anxiety that cannot be redirected. Watch for sedation and risk of falls. Patient taking differently: Take 0.5 mg by mouth at bedtime. May take 1 tab every 8 hours as needed for agitation/anxiety that cannot be redirected. Watch for sedation  and risk of falls. 09/29/18  Yes Melvenia Beam, MD  memantine (NAMENDA) 10 MG tablet TAKE (1) TABLET BY MOUTH TWICE DAILY. Patient taking differently: Take 10 mg by mouth 2 (two) times daily.  10/15/18  Yes Melvenia Beam, MD  metoprolol succinate (TOPROL-XL) 25 MG 24 hr tablet Take 25 mg by mouth every morning.    Yes [provider]  prednisoLONE acetate (PRED FORTE) 1 % ophthalmic suspension Place 1 drop into the left eye 4 (four) times daily. 09/14/18  Yes [provider]  rosuvastatin (CRESTOR) 5 MG tablet Take 5 mg by mouth every morning.    Yes [provider]  sertraline (ZOLOFT) 25 MG tablet Take 25 mg by mouth every morning.    Yes [provider]  valACYclovir (VALTREX) 1000 MG tablet Take 1 g by mouth every morning. 10/08/18  Yes [provider]  doxycycline (VIBRAMYCIN) 100 MG capsule Take 1 capsule (100 mg total) by mouth 2 (two) times daily. 12/03/18   Evalee Jefferson, PA-C    Family History Family History  Problem Relation Age of Onset  . Hypertension Mother   . Cancer Father        head and neck     Social History Social History   Tobacco Use  . Smoking status: Never Smoker  . Smokeless tobacco: Never Used  Substance Use Topics  . Alcohol use: No    Alcohol/week: 0.0 standard drinks  . Drug use: No     Allergies   Atorvastatin   Review of Systems Review of Systems  Constitutional: Negative for chills.  HENT: Negative for congestion and sore throat.   Eyes: Negative.   Respiratory: Negative for chest tightness and shortness of breath.   Cardiovascular: Positive for leg swelling. Negative for chest pain.  Gastrointestinal: Negative for abdominal pain.  Genitourinary: Negative.   Musculoskeletal: Negative for arthralgias, joint swelling and neck pain.  Skin: Positive for wound. Negative for rash.  Neurological: Negative for dizziness, weakness, light-headedness and numbness.  Psychiatric/Behavioral: Negative.      Physical Exam Updated Vital Signs BP (!) 154/71 (BP Location: Left Arm)   Pulse 79   Temp 98.1 F (36.7 C) (Oral)   Resp 17   Wt 96.6 kg   SpO2 98%   BMI 38.95 kg/m   Physical Exam Vitals signs and nursing note reviewed.  Constitutional:      Appearance: She is well-developed.  HENT:     Head: Normocephalic and atraumatic.  Eyes:     Conjunctiva/sclera: Conjunctivae normal.  Neck:     Musculoskeletal: Normal range of motion.  Cardiovascular:     Rate and Rhythm: Normal rate and regular rhythm.     Heart sounds: Normal heart sounds.  Pulmonary:     Effort:  Pulmonary effort is normal.     Breath sounds: Normal breath sounds. No wheezing.  Musculoskeletal: Normal range of motion.     Right lower leg: Edema present.     Left lower leg: Edema present.     Comments: Small healing abscess left posterior calf.  No induration or surrounding erythema, no red streaking, no fluctuance.  There is a shallow ulceration at the site with yellow thick exudate.  Bilateral pitting edema of both lower legs.  Skin:    General: Skin is warm and dry.     Comments: Well healing lacerations x 2 right volar and lateral forearm, sealed avulsive abrasion at the lateral edge of the volar wound.  No significant  edema or drainage. No erythema. Nontender.  Scabbed wound right upper lower leg with subtle surrounding erythema.  Neurological:     Mental Status: She is alert.      ED Treatments / Results  Labs (all labs ordered are listed, but only abnormal results are displayed) Labs Reviewed - No data to display  EKG None  Radiology US Venous Img Lower Unilateral Right  Result Date: 12/03/2018 CLINICAL DATA:  Right lower extremity swelling EXAM: RIGHT LOWER EXTREMITY VENOUS DOPPLER ULTRASOUND TECHNIQUE: Gray-scale sonography with graded compression, as well as color Doppler and duplex ultrasound were performed to evaluate the lower extremity deep venous systems from the level of the common femoral vein and including the common femoral, femoral, profunda femoral, popliteal and calf veins including the posterior tibial, peroneal and gastrocnemius veins when visible. The superficial great saphenous vein was also interrogated. Spectral Doppler was utilized to evaluate flow at rest and with distal augmentation maneuvers in the common femoral, femoral and popliteal veins. COMPARISON:  None. FINDINGS: Contralateral Common Femoral Vein: Respiratory phasicity is normal and symmetric with the symptomatic side. No evidence of thrombus. Normal compressibility. Common Femoral Vein:  No evidence of thrombus. Normal compressibility, respiratory phasicity and response to augmentation. Saphenofemoral Junction: No evidence of thrombus. Normal compressibility and flow on color Doppler imaging. Profunda Femoral Vein: No evidence of thrombus. Normal compressibility and flow on color Doppler imaging. Femoral Vein: No evidence of thrombus. Normal compressibility, respiratory phasicity and response to augmentation. Popliteal Vein: No evidence of thrombus. Normal compressibility, respiratory phasicity and response to augmentation. Calf Veins: No evidence of thrombus. Normal compressibility and flow on color Doppler imaging. Superficial Great Saphenous Vein: No evidence of thrombus. Normal compressibility. Venous Reflux:  None. Other Findings:  Subcutaneous edema evident IMPRESSION: No evidence of deep venous thrombosis. Electronically Signed   By: Jerilynn Mages.  Shick M.D.   On: 12/03/2018 12:04    Procedures Procedures (including critical care time)  SUTURE REMOVAL Performed by: Evalee Jefferson  Consent: Verbal consent obtained. Patient identity confirmed: provided demographic data Time out: Immediately prior to procedure a "time out" was called to verify the correct patient, procedure, equipment, support staff and site/side marked as required.  Location details: right forearm  Wound Appearance: clean  Sutures/Staples Removed: 9  Facility: sutures placed in this facility Patient tolerance: Patient tolerated the procedure well with no immediate complications.  Ulnar edge of the volar laceration developed slight dehiscence with suture removal.  The site was treated with sterile strips and dressing.      Medications Ordered in ED Medications  doxycycline (VIBRA-TABS) tablet 100 mg (100 mg Oral Given 12/03/18 1336)     Initial Impression / Assessment and Plan / ED Course  I have reviewed the triage vital signs and the nursing notes.  Pertinent labs & imaging results that were available  during my care of the patient were reviewed by me and considered in my medical decision making (see chart for details).     Left posterior abscess site appears healing, but not completely. There is no evidence for worsening pus pocket or need for repeat I&D.  Discussed continued warm water soaks.  Right lower extremity wound with erythema with hints of possible early cellulitis.  She was started on doxycycline for these infections.  Discussed close f/u with pcp or return here for worsened sx.  Prn f/u anticipated.  Pt was seen by Dr. Lacinda Axon during todays visit.   Final Clinical Impressions(s) / ED Diagnoses   Final diagnoses:  Visit  for suture removal  Visit for wound check    ED Discharge Orders         Ordered    doxycycline (VIBRAMYCIN) 100 MG capsule  2 times daily     12/03/18 1318           Evalee Jefferson, Hershal Coria 12/04/18 1705    Nat Christen, MD 12/05/18 1506

## 2018-12-03 NOTE — Discharge Instructions (Addendum)
It is ok to gently wash with soap and water around the incision sites at this time.  There sterile strips will fall away naturally and is adding a few days of added protection while your wounds continue to heal.  Continue the antibiotic until gone.  Continue using warm water compresses to the healing abscess on the left calf - 10 minutes 3 times daily.  See your doctor for a recheck early next week for any persistent symptoms, returning here for a recheck if any symptoms worsen (redness, swelling, increased drainage).  Your ultrasound is negative - there is no sign of a blood clot in your leg.  Elevation of your legs can help reduce swelling.  If this symptom persist, I encourage you to discuss with your doctor if putting you back on a fluid pill would be helpful.

## 2018-12-23 DIAGNOSIS — Z4800 Encounter for change or removal of nonsurgical wound dressing: Secondary | ICD-10-CM | POA: Diagnosis not present

## 2018-12-23 DIAGNOSIS — L03116 Cellulitis of left lower limb: Secondary | ICD-10-CM | POA: Diagnosis not present

## 2018-12-23 DIAGNOSIS — I10 Essential (primary) hypertension: Secondary | ICD-10-CM | POA: Diagnosis not present

## 2018-12-23 DIAGNOSIS — Z7982 Long term (current) use of aspirin: Secondary | ICD-10-CM | POA: Diagnosis not present

## 2018-12-23 DIAGNOSIS — Z7901 Long term (current) use of anticoagulants: Secondary | ICD-10-CM | POA: Diagnosis not present

## 2018-12-24 DIAGNOSIS — I10 Essential (primary) hypertension: Secondary | ICD-10-CM | POA: Diagnosis not present

## 2018-12-24 DIAGNOSIS — Z4800 Encounter for change or removal of nonsurgical wound dressing: Secondary | ICD-10-CM | POA: Diagnosis not present

## 2018-12-24 DIAGNOSIS — L03116 Cellulitis of left lower limb: Secondary | ICD-10-CM | POA: Diagnosis not present

## 2018-12-24 DIAGNOSIS — Z7982 Long term (current) use of aspirin: Secondary | ICD-10-CM | POA: Diagnosis not present

## 2018-12-25 DIAGNOSIS — Z4800 Encounter for change or removal of nonsurgical wound dressing: Secondary | ICD-10-CM | POA: Diagnosis not present

## 2018-12-25 DIAGNOSIS — L03116 Cellulitis of left lower limb: Secondary | ICD-10-CM | POA: Diagnosis not present

## 2018-12-25 DIAGNOSIS — I10 Essential (primary) hypertension: Secondary | ICD-10-CM | POA: Diagnosis not present

## 2018-12-25 DIAGNOSIS — Z7982 Long term (current) use of aspirin: Secondary | ICD-10-CM | POA: Diagnosis not present

## 2018-12-30 DIAGNOSIS — Z7982 Long term (current) use of aspirin: Secondary | ICD-10-CM | POA: Diagnosis not present

## 2018-12-30 DIAGNOSIS — L03116 Cellulitis of left lower limb: Secondary | ICD-10-CM | POA: Diagnosis not present

## 2018-12-30 DIAGNOSIS — I10 Essential (primary) hypertension: Secondary | ICD-10-CM | POA: Diagnosis not present

## 2018-12-30 DIAGNOSIS — Z4800 Encounter for change or removal of nonsurgical wound dressing: Secondary | ICD-10-CM | POA: Diagnosis not present

## 2019-01-05 DIAGNOSIS — L03116 Cellulitis of left lower limb: Secondary | ICD-10-CM | POA: Diagnosis not present

## 2019-01-05 DIAGNOSIS — I10 Essential (primary) hypertension: Secondary | ICD-10-CM | POA: Diagnosis not present

## 2019-01-05 DIAGNOSIS — Z4800 Encounter for change or removal of nonsurgical wound dressing: Secondary | ICD-10-CM | POA: Diagnosis not present

## 2019-01-05 DIAGNOSIS — Z7982 Long term (current) use of aspirin: Secondary | ICD-10-CM | POA: Diagnosis not present

## 2019-01-07 DIAGNOSIS — L03116 Cellulitis of left lower limb: Secondary | ICD-10-CM | POA: Diagnosis not present

## 2019-01-07 DIAGNOSIS — Z7982 Long term (current) use of aspirin: Secondary | ICD-10-CM | POA: Diagnosis not present

## 2019-01-07 DIAGNOSIS — Z4800 Encounter for change or removal of nonsurgical wound dressing: Secondary | ICD-10-CM | POA: Diagnosis not present

## 2019-01-07 DIAGNOSIS — I10 Essential (primary) hypertension: Secondary | ICD-10-CM | POA: Diagnosis not present

## 2019-01-13 DIAGNOSIS — Z7982 Long term (current) use of aspirin: Secondary | ICD-10-CM | POA: Diagnosis not present

## 2019-01-13 DIAGNOSIS — Z4800 Encounter for change or removal of nonsurgical wound dressing: Secondary | ICD-10-CM | POA: Diagnosis not present

## 2019-01-13 DIAGNOSIS — L03116 Cellulitis of left lower limb: Secondary | ICD-10-CM | POA: Diagnosis not present

## 2019-01-13 DIAGNOSIS — I10 Essential (primary) hypertension: Secondary | ICD-10-CM | POA: Diagnosis not present

## 2019-01-15 DIAGNOSIS — R319 Hematuria, unspecified: Secondary | ICD-10-CM | POA: Diagnosis not present

## 2019-01-15 DIAGNOSIS — N760 Acute vaginitis: Secondary | ICD-10-CM | POA: Diagnosis not present

## 2019-01-15 DIAGNOSIS — N950 Postmenopausal bleeding: Secondary | ICD-10-CM | POA: Diagnosis not present

## 2019-01-21 ENCOUNTER — Encounter: Payer: Self-pay | Admitting: Obstetrics and Gynecology

## 2019-01-22 DIAGNOSIS — Z79899 Other long term (current) drug therapy: Secondary | ICD-10-CM | POA: Diagnosis not present

## 2019-01-22 DIAGNOSIS — R600 Localized edema: Secondary | ICD-10-CM | POA: Diagnosis not present

## 2019-01-22 DIAGNOSIS — Z4800 Encounter for change or removal of nonsurgical wound dressing: Secondary | ICD-10-CM | POA: Diagnosis not present

## 2019-01-22 DIAGNOSIS — G309 Alzheimer's disease, unspecified: Secondary | ICD-10-CM | POA: Diagnosis not present

## 2019-01-22 DIAGNOSIS — N950 Postmenopausal bleeding: Secondary | ICD-10-CM | POA: Diagnosis not present

## 2019-01-22 DIAGNOSIS — I10 Essential (primary) hypertension: Secondary | ICD-10-CM | POA: Diagnosis not present

## 2019-01-22 DIAGNOSIS — L03116 Cellulitis of left lower limb: Secondary | ICD-10-CM | POA: Diagnosis not present

## 2019-01-22 DIAGNOSIS — R609 Edema, unspecified: Secondary | ICD-10-CM | POA: Diagnosis not present

## 2019-01-22 DIAGNOSIS — Z7982 Long term (current) use of aspirin: Secondary | ICD-10-CM | POA: Diagnosis not present

## 2019-01-25 NOTE — Telephone Encounter (Signed)
If she can be scheduled for a transvaginal ultrasound, perhaps at [facility], that would move the process forward.

## 2019-01-26 ENCOUNTER — Encounter: Payer: Self-pay | Admitting: Obstetrics and Gynecology

## 2019-01-26 ENCOUNTER — Other Ambulatory Visit: Payer: Self-pay | Admitting: Obstetrics and Gynecology

## 2019-01-26 ENCOUNTER — Ambulatory Visit (INDEPENDENT_AMBULATORY_CARE_PROVIDER_SITE_OTHER): Payer: Medicare Other | Admitting: Obstetrics and Gynecology

## 2019-01-26 VITALS — BP 156/80 | HR 82 | Ht 63.0 in | Wt 220.4 lb

## 2019-01-26 DIAGNOSIS — R9389 Abnormal findings on diagnostic imaging of other specified body structures: Secondary | ICD-10-CM

## 2019-01-26 DIAGNOSIS — C541 Malignant neoplasm of endometrium: Secondary | ICD-10-CM | POA: Diagnosis not present

## 2019-01-26 DIAGNOSIS — N950 Postmenopausal bleeding: Secondary | ICD-10-CM

## 2019-01-26 MED ORDER — CEPHALEXIN 500 MG PO CAPS: 500.0000 mg | ORAL_CAPSULE | Freq: Four times a day (QID) | ORAL | 1 refills | Status: DC

## 2019-01-26 NOTE — Progress Notes (Signed)
[facility] Clinic Visit  @DATE @            Patient name: Judith Schroeder MRN [id number]  Date of birth: February 09, 1946  CC & HPI:  Judith Schroeder is a 73 y.o. female presenting today for this menopausal bleeding evaluation.  She is not seen anybody before she had a referral to [facility] but did not get the ultrasound The patient records suggest that she has had a hysterectomy but did that is not completely clear after further conversation with the patient who remembers having had a D&C only  ROS:  ROS History of kidney stones mention several times per the patient.  Some forgetfulness and repetitive conversation  Pertinent History Reviewed:   Reviewed: Significant for late onset Alzheimer's Medical         Past Medical History:  Diagnosis Date  . Carpal tunnel syndrome   . Dementia ([place])   . Depression   . H/O aortic valve replacement   . High blood pressure   . High cholesterol   . Pleural effusion   . Shingles 2019   L eye                              Surgical Hx:    Past Surgical History:  Procedure Laterality Date  . AORTIC VALVE REPLACEMENT  2012  . EMPYEMA DRAINAGE  2017  . TUBAL LIGATION    . VIDEO ASSISTED THORACOSCOPY (VATS)/EMPYEMA Right 09/07/2016   Procedure: VIDEO ASSISTED THORACOSCOPY (VATS)/EMPYEMA;  Surgeon: Gaye Pollack, MD;  Location: [facility] OR;  Service: Thoracic;  Laterality: Right;   Medications: Reviewed & Updated - see associated section                       Current Outpatient Medications:  .  aspirin EC 81 MG tablet, Take 81 mg by mouth daily. , Disp: , Rfl:  .  cholecalciferol (VITAMIN D3) 25 MCG (1000 UT) tablet, Take 1,000 Units by mouth every morning., Disp: , Rfl:  .  donepezil (ARICEPT) 10 MG tablet, Take 1 tablet (10 mg total) by mouth at bedtime., Disp: 90 tablet, Rfl: 4 .  eplerenone (INSPRA) 25 MG tablet, Take 25 mg by mouth every morning. , Disp: , Rfl:  .  furosemide (LASIX) 20 MG tablet, Take 20 mg by mouth daily., Disp: , Rfl:  .   LORazepam (ATIVAN) 0.5 MG tablet, 1 tab every 8 hours as needed for agitation/anxiety that cannot be redirected. Watch for sedation and risk of falls. (Patient taking differently: Take 0.5 mg by mouth at bedtime. May take 1 tab every 8 hours as needed for agitation/anxiety that cannot be redirected. Watch for sedation and risk of falls.), Disp: 30 tablet, Rfl: 4 .  memantine (NAMENDA) 10 MG tablet, TAKE (1) TABLET BY MOUTH TWICE DAILY. (Patient taking differently: Take 10 mg by mouth 2 (two) times daily. ), Disp: 60 tablet, Rfl: 11 .  metoprolol succinate (TOPROL-XL) 25 MG 24 hr tablet, Take 25 mg by mouth every morning. , Disp: , Rfl:  .  prednisoLONE acetate (PRED FORTE) 1 % ophthalmic suspension, Place 1 drop into the left eye 4 (four) times daily., Disp: , Rfl: 0 .  predniSONE (DELTASONE) 10 MG tablet, Take 10 mg by mouth daily with breakfast., Disp: , Rfl:  .  rosuvastatin (CRESTOR) 5 MG tablet, Take 5 mg by mouth every morning. , Disp: , Rfl:  .  sertraline (ZOLOFT) 25 MG tablet, Take 25 mg by mouth every morning. , Disp: , Rfl:  .  valACYclovir (VALTREX) 1000 MG tablet, Take 1 g by mouth every morning., Disp: , Rfl: 4 .  cephALEXin (KEFLEX) 500 MG capsule, Take 1 capsule (500 mg total) by mouth 4 (four) times daily. For one day. May take 2 as initial dose., Disp: 4 capsule, Rfl: 1   Social History: Reviewed -  reports that she has never smoked. She has never used smokeless tobacco.  Objective Findings:  Vitals: Blood pressure (!) 156/80, pulse 82, height 5\' 3"  (1.6 m), weight 220 lb 6.4 oz (100 kg).  PHYSICAL EXAMINATION General appearance - alert, well appearing, and in no distress, oriented to person, place, and time and overweight Mental status - alert, oriented to person, place, and time Chest - clear to auscultation, no wheezes, rales or rhonchi, symmetric air entry Heart - normal rate and regular rhythm Abdomen - soft, nontender, nondistended, no masses or organomegaly Breasts -  breasts appear normal, no suspicious masses, no skin or nipple changes or axillary nodes Skin - normal coloration and turgor, no rashes, no suspicious skin lesions noted  PELVIC External genitalia -normal female no lesions Vulva -unremarkable urethral caruncle present Vagina postmenopausal moderate amount of watery discharge suggesting secretions from the uterus Cervix -second-degree uterine descensus to within 3 cm of the introitus light amount of bleeding present.  Pap smear not yet done  Transvaginal ultrasound is performed by me as point-of-care test the uterus is present Uterus -endometrial cavity thickened 1.6 cm with heterogeneous material present  Adnexa is negative for masses   Adnexa -no masses on exam or ultrasound [place] -not done Rectal - rectal exam not indicated  Endometrial Biopsy Procedure Note  Pre-operative Diagnosis: postmenopausal bleeding.  Post-operative Diagnosis: same  Indications: postmenopausal bleeding  Procedure Details   Urine pregnancy test was not done.  The risks (including infection, bleeding, pain, and uterine perforation) and benefits of the procedure were explained to the patient and Written informed consent was obtained.  Antibiotic prophylaxis against endocarditis was indicated.   The patient was placed in the dorsal lithotomy position.  Bimanual exam showed the uterus to be in the anteroflexed position.  A Graves' speculum inserted in the vagina, and the cervix prepped with povidone iodine.  Endocervical curettage  was not performed.   A sharp tenaculum was applied to the anterior lip of the cervix for stabilization.  A sterile uterine sound was used to sound the uterus to a depth of 9.5cm.  A Mylex 51mm curette was used to sample the endometrium.  Sample was sent for pathologic examination the sample was a very generous heterogeneous combination of blood and old blood and tissue  chunks.  Condition: Stable  Complications: None  Plan:  The patient was advised to call for any fever or for prolonged or severe pain or bleeding. She was advised to use motrin as needed for mild to moderate pain.  Attending Physician Documentation: I did the u/s and the biopsy     Assessment & Plan:   A:  1. Postmenopausal bleeding 2. Thickened endometrial tissue suspicious for malignancy  P:  1. Follow-up pathology report 2.

## 2019-01-26 NOTE — Patient Instructions (Signed)

## 2019-01-27 ENCOUNTER — Other Ambulatory Visit: Payer: Self-pay | Admitting: *Deleted

## 2019-01-27 DIAGNOSIS — Z4800 Encounter for change or removal of nonsurgical wound dressing: Secondary | ICD-10-CM | POA: Diagnosis not present

## 2019-01-27 DIAGNOSIS — I10 Essential (primary) hypertension: Secondary | ICD-10-CM | POA: Diagnosis not present

## 2019-01-27 DIAGNOSIS — Z7982 Long term (current) use of aspirin: Secondary | ICD-10-CM | POA: Diagnosis not present

## 2019-01-27 DIAGNOSIS — L03116 Cellulitis of left lower limb: Secondary | ICD-10-CM | POA: Diagnosis not present

## 2019-01-28 DIAGNOSIS — L03116 Cellulitis of left lower limb: Secondary | ICD-10-CM | POA: Diagnosis not present

## 2019-01-28 DIAGNOSIS — Z4800 Encounter for change or removal of nonsurgical wound dressing: Secondary | ICD-10-CM | POA: Diagnosis not present

## 2019-01-28 DIAGNOSIS — I10 Essential (primary) hypertension: Secondary | ICD-10-CM | POA: Diagnosis not present

## 2019-01-28 DIAGNOSIS — Z7982 Long term (current) use of aspirin: Secondary | ICD-10-CM | POA: Diagnosis not present

## 2019-01-29 ENCOUNTER — Telehealth: Payer: Self-pay | Admitting: Obstetrics and Gynecology

## 2019-01-29 DIAGNOSIS — B0230 Zoster ocular disease, unspecified: Secondary | ICD-10-CM | POA: Diagnosis not present

## 2019-01-29 DIAGNOSIS — H04123 Dry eye syndrome of bilateral lacrimal glands: Secondary | ICD-10-CM | POA: Diagnosis not present

## 2019-01-29 NOTE — Telephone Encounter (Signed)
Phone call to patient's daughter, at her home cell phone number, diagnosis reviewed. . She is told to expect a call from the [place] [facility] next Monday. She has been given the [facility] office number.

## 2019-02-01 ENCOUNTER — Telehealth: Payer: Self-pay | Admitting: *Deleted

## 2019-02-01 NOTE — Telephone Encounter (Signed)
Called and spoke with the patient's daughter regarding the referral from Dr. Glo Herring. Appt scheduled for this Friday at [place] the daughter the address, and information for free valet and pelvic exam.

## 2019-02-02 ENCOUNTER — Other Ambulatory Visit: Payer: Self-pay | Admitting: *Deleted

## 2019-02-02 DIAGNOSIS — C541 Malignant neoplasm of endometrium: Secondary | ICD-10-CM

## 2019-02-03 DIAGNOSIS — Z7982 Long term (current) use of aspirin: Secondary | ICD-10-CM | POA: Diagnosis not present

## 2019-02-03 DIAGNOSIS — Z4800 Encounter for change or removal of nonsurgical wound dressing: Secondary | ICD-10-CM | POA: Diagnosis not present

## 2019-02-03 DIAGNOSIS — L03116 Cellulitis of left lower limb: Secondary | ICD-10-CM | POA: Diagnosis not present

## 2019-02-03 DIAGNOSIS — I10 Essential (primary) hypertension: Secondary | ICD-10-CM | POA: Diagnosis not present

## 2019-02-04 ENCOUNTER — Telehealth: Payer: Self-pay | Admitting: *Deleted

## 2019-02-04 NOTE — Telephone Encounter (Signed)
Called the patient's daughter and left a message to call the office back. Left message that the cancer center will be open tomorrow, to please call and confirm appt

## 2019-02-05 ENCOUNTER — Inpatient Hospital Stay: Payer: Medicare Other | Attending: Gynecologic Oncology | Admitting: Gynecologic Oncology

## 2019-02-05 ENCOUNTER — Encounter: Payer: Self-pay | Admitting: Gynecologic Oncology

## 2019-02-05 VITALS — BP 149/60 | HR 76 | Temp 98.3°F | Resp 20 | Ht 62.0 in | Wt 221.0 lb

## 2019-02-05 DIAGNOSIS — G8918 Other acute postprocedural pain: Secondary | ICD-10-CM

## 2019-02-05 DIAGNOSIS — G309 Alzheimer's disease, unspecified: Secondary | ICD-10-CM | POA: Diagnosis not present

## 2019-02-05 DIAGNOSIS — C541 Malignant neoplasm of endometrium: Secondary | ICD-10-CM | POA: Insufficient documentation

## 2019-02-05 MED ORDER — SENNOSIDES-DOCUSATE SODIUM 8.6-50 MG PO TABS: 2.0000 | ORAL_TABLET | Freq: Every day | ORAL | 1 refills | Status: DC

## 2019-02-05 MED ORDER — TRAMADOL HCL 50 MG PO TABS: 50.0000 mg | ORAL_TABLET | Freq: Four times a day (QID) | ORAL | 0 refills | Status: DC | PRN

## 2019-02-05 NOTE — Progress Notes (Signed)
Consult Note: Gyn-Onc  Consult was requested by Dr. Glo Herring for the evaluation of Judith Schroeder 73 y.o. female  CC:  Chief Complaint  Patient presents with  . Endometrial adenocarcinoma [facility])    Assessment/Plan:  Judith Schroeder  is a 73 y.o.  year old with dementia and a grade2 endometrial cancer.  A detailed discussion was held with the patient and her family with regard to to her endometrial cancer diagnosis. We discussed the standard management options for uterine cancer which includes surgery followed possibly by adjuvant therapy depending on the results of surgery. The options for surgical management include a hysterectomy and removal of the tubes and ovaries possibly with removal of pelvic and para-aortic lymph nodes.If feasible, a minimally invasive approach including a robotic hysterectomy or laparoscopic hysterectomy have benefits including shorter hospital stay, recovery time and better wound healing than with open surgery. The patient has been counseled about these surgical options and the risks of surgery in general including infection, bleeding, damage to surrounding structures (including bowel, bladder, ureters, nerves or vessels), and the postoperative risks of PE/ DVT, and lymphedema. I extensively reviewed the additional risks of robotic hysterectomy including possible need for conversion to open laparotomy.  I discussed positioning during surgery of trendelenberg and risks of minor facial swelling and care we take in preoperative positioning.  She has dementia but is very highly functioning. Her daughter iterated that she is most interested in preserving her good quality of life and would like to avoid interventions that might compromise that.  I discussed options regarding D&C and progestin IUD as an alternative to surgery. However, this is less definitive, and I wonder for how long that will control her symptoms.  After counseling and consideration of her options, she  desires to proceed with robotic assisted total hysterectomy with bilateral sapingo-oophorectomy and SLN biopsy.   She will be seen by her cardiologist at [facility] to discuss if she requires optimization prior to general anesthesia and major abdominal surgery. She has some increasing SOB on exertion but has a recently normal Hb. I am ok to proceed with surgery with her taking ASA 81mg .  She was given the opportunity to ask questions, which were answered to her satisfaction, and she is agreement with the above mentioned plan of care.  HPI: 73year old woman who is seen in consultation at the request of Dr Glo Herring for a grade 2 endometrial cancer.  The patient has alzheimers dementia and a history of an aortic valve replacement. She reported vaginal bleeding to her daughter in January. This persisted and so she was seen by Dr Glo Herring on 01/26/19 who performed a TVUS which showed a thickened endometrium.  She then underwent a biopsy the same day. This showed grade 2 endometrioid endometrial cancer.   The patient has progressive dementia but is conversant and cooperative and able to give some history herself. She ambulates independently and is independent with ADL's.  She has reported increasing SOB on exertion recently.   She has a history of an aortic valve replacement and was on anticoagulant therapy initially but is only taking ASA now. Her cardiologist is Dr [place] at [facility].    Current Meds:  Outpatient Encounter Medications as of 02/05/2019  Medication Sig  . aspirin EC 81 MG tablet Take 81 mg by mouth daily.   . cholecalciferol (VITAMIN D3) 25 MCG (1000 UT) tablet Take 1,000 Units by mouth every morning.  . donepezil (ARICEPT) 10 MG tablet Take 1 tablet (10 mg  total) by mouth at bedtime.  Marland Kitchen eplerenone (INSPRA) 25 MG tablet Take 25 mg by mouth every morning.   . furosemide (LASIX) 20 MG tablet Take 20 mg by mouth daily.  Marland Kitchen LORazepam (ATIVAN) 0.5 MG tablet 1 tab every 8 hours as needed for  agitation/anxiety that cannot be redirected. Watch for sedation and risk of falls. (Patient taking differently: Take 0.5 mg by mouth at bedtime. May take 1 tab every 8 hours as needed for agitation/anxiety that cannot be redirected. Watch for sedation and risk of falls.)  . memantine (NAMENDA) 10 MG tablet TAKE (1) TABLET BY MOUTH TWICE DAILY. (Patient taking differently: Take 10 mg by mouth 2 (two) times daily. )  . metoprolol succinate (TOPROL-XL) 25 MG 24 hr tablet Take 25 mg by mouth every morning.   . prednisoLONE acetate (PRED FORTE) 1 % ophthalmic suspension Place 1 drop into the left eye 4 (four) times daily.  . predniSONE (DELTASONE) 10 MG tablet Take 10 mg by mouth daily with breakfast.  . rosuvastatin (CRESTOR) 5 MG tablet Take 5 mg by mouth every morning.   . sertraline (ZOLOFT) 25 MG tablet Take 25 mg by mouth every morning.   . valACYclovir (VALTREX) 1000 MG tablet Take 1 g by mouth every morning.  . senna-docusate (SENOKOT-S) 8.6-50 MG tablet Take 2 tablets by mouth at bedtime.  . traMADol (ULTRAM) 50 MG tablet Take 1 tablet (50 mg total) by mouth every 6 (six) hours as needed for severe pain. For AFTER surgery, do not take and drive  . [DISCONTINUED] cephALEXin (KEFLEX) 500 MG capsule Take 1 capsule (500 mg total) by mouth 4 (four) times daily. For one day. May take 2 as initial dose. (Patient not taking: Reported on 02/05/2019)   No facility-administered encounter medications on file as of 02/05/2019.     Allergy:  Allergies  Allergen Reactions  . Atorvastatin Other (See Comments)    Muscle pain    Social Hx:   Social History   Socioeconomic History  . Marital status: Widowed    Spouse name: Not on file  . Number of children: 1  . Years of education: 12+  . Highest education level: Not on file  Occupational History  . Occupation: Retired  Scientific laboratory technician  . Financial resource strain: Not on file  . Food insecurity:    Worry: Not on file    Inability: Not on file  .  Transportation needs:    Medical: Not on file    Non-medical: Not on file  Tobacco Use  . Smoking status: Never Smoker  . Smokeless tobacco: Never Used  Substance and Sexual Activity  . Alcohol use: No    Alcohol/week: 0.0 standard drinks  . Drug use: No  . Sexual activity: Not Currently  Lifestyle  . Physical activity:    Days per week: Not on file    Minutes per session: Not on file  . Stress: Not on file  Relationships  . Social connections:    Talks on phone: Not on file    Gets together: Not on file    Attends religious service: Not on file    Active member of club or organization: Not on file    Attends meetings of clubs or organizations: Not on file    Relationship status: Not on file  . Intimate partner violence:    Fear of current or ex partner: Not on file    Emotionally abused: Not on file    Physically abused: Not  on file    Forced sexual activity: Not on file  Other Topics Concern  . Not on file  Social History Narrative   Patient lives at home alone.    Patient is widowed.    Patient has 1 child   Patient has 2 years of college   Patient is right handed    Caffeine use: 2-3 glasses diet green tea per day   No soda     Past Surgical Hx:  Past Surgical History:  Procedure Laterality Date  . AORTIC VALVE REPLACEMENT  2012  . EMPYEMA DRAINAGE  2017  . TUBAL LIGATION    . VIDEO ASSISTED THORACOSCOPY (VATS)/EMPYEMA Right 09/07/2016   Procedure: VIDEO ASSISTED THORACOSCOPY (VATS)/EMPYEMA;  Surgeon: Gaye Pollack, MD;  Location: [facility] OR;  Service: Thoracic;  Laterality: Right;    Past Medical Hx:  Past Medical History:  Diagnosis Date  . Carpal tunnel syndrome   . Dementia ([place])   . Dementia ([place]) 2018  . Depression   . H/O aortic valve replacement   . High blood pressure   . High cholesterol   . Pleural effusion   . Shingles 2019   L eye    Past Gynecological History:  SVD x 1 No LMP recorded. Patient has had a hysterectomy.  Family Hx:   Family History  Problem Relation Age of Onset  . Hypertension Mother   . Cancer Father        head and neck   . Dementia Sister     Review of Systems:  Constitutional  Feels well,    ENT Normal appearing ears and nares bilaterally Skin/Breast  No rash, sores, jaundice, itching, dryness Cardiovascular  No chest pain, shortness of breath, or edema  Pulmonary  No cough or wheeze.  Gastro Intestinal  No nausea, vomitting, or diarrhoea. No bright red blood per rectum, no abdominal pain, change in bowel movement, or constipation.  Genito Urinary  No frequency, urgency, dysuria, + postmenopausal bleeding Musculo Skeletal  No myalgia, arthralgia, joint swelling or pain  Neurologic  No weakness, numbness, change in gait,  Psychology  No depression, anxiety, insomnia. + dementia  Vitals:  Blood pressure (!) 149/60, pulse 76, temperature 98.3 F (36.8 C), temperature source Oral, resp. rate 20, height 5\' 2"  (1.575 m), weight 221 lb (100.2 kg), SpO2 99 %.  Physical Exam: WD in NAD Neck  Supple NROM, without any enlargements.  Lymph Node Survey No cervical supraclavicular or inguinal adenopathy Cardiovascular  Pulse normal rate, regularity and rhythm. S1 and S2 normal.  Lungs  Clear to auscultation bilateraly, without wheezes/crackles/rhonchi. Good air movement.  Skin  No rash/lesions/breakdown  Psychiatry  Alert and oriented to person, place, and time  Abdomen  Normoactive bowel sounds, abdomen soft, non-tender and obese without evidence of hernia. Back No CVA tenderness Genito Urinary  Vulva/vagina: Normal external female genitalia.  No lesions. No discharge or bleeding.  Bladder/urethra:  No lesions or masses, well supported bladder  Vagina: normal  Cervix: Normal appearing, no lesions.  Uterus:  Small, mobile, no parametrial involvement or nodularity.  Adnexa: no palpable masses. Rectal  deferred Extremities  No bilateral cyanosis, clubbing or edema.   Thereasa Solo, MD  02/05/2019, 3:22 PM

## 2019-02-05 NOTE — H&P (View-Only) (Signed)
Consult Note: Gyn-Onc  Consult was requested by Dr. Glo Herring for the evaluation of Judith Schroeder 73 y.o. female  CC:  Chief Complaint  Patient presents with  . Endometrial adenocarcinoma [facility])    Assessment/Plan:  Judith Schroeder  is a 73 y.o.  year old with dementia and a grade2 endometrial cancer.  A detailed discussion was held with the patient and her family with regard to to her endometrial cancer diagnosis. We discussed the standard management options for uterine cancer which includes surgery followed possibly by adjuvant therapy depending on the results of surgery. The options for surgical management include a hysterectomy and removal of the tubes and ovaries possibly with removal of pelvic and para-aortic lymph nodes.If feasible, a minimally invasive approach including a robotic hysterectomy or laparoscopic hysterectomy have benefits including shorter hospital stay, recovery time and better wound healing than with open surgery. The patient has been counseled about these surgical options and the risks of surgery in general including infection, bleeding, damage to surrounding structures (including bowel, bladder, ureters, nerves or vessels), and the postoperative risks of PE/ DVT, and lymphedema. I extensively reviewed the additional risks of robotic hysterectomy including possible need for conversion to open laparotomy.  I discussed positioning during surgery of trendelenberg and risks of minor facial swelling and care we take in preoperative positioning.  She has dementia but is very highly functioning. Her daughter iterated that she is most interested in preserving her good quality of life and would like to avoid interventions that might compromise that.  I discussed options regarding D&C and progestin IUD as an alternative to surgery. However, this is less definitive, and I wonder for how long that will control her symptoms.  After counseling and consideration of her options, she  desires to proceed with robotic assisted total hysterectomy with bilateral sapingo-oophorectomy and SLN biopsy.   She will be seen by her cardiologist at [facility] to discuss if she requires optimization prior to general anesthesia and major abdominal surgery. She has some increasing SOB on exertion but has a recently normal Hb. I am ok to proceed with surgery with her taking ASA 81mg .  She was given the opportunity to ask questions, which were answered to her satisfaction, and she is agreement with the above mentioned plan of care.  HPI: 73year old woman who is seen in consultation at the request of Dr Glo Herring for a grade 2 endometrial cancer.  The patient has alzheimers dementia and a history of an aortic valve replacement. She reported vaginal bleeding to her daughter in January. This persisted and so she was seen by Dr Glo Herring on 01/26/19 who performed a TVUS which showed a thickened endometrium.  She then underwent a biopsy the same day. This showed grade 2 endometrioid endometrial cancer.   The patient has progressive dementia but is conversant and cooperative and able to give some history herself. She ambulates independently and is independent with ADL's.  She has reported increasing SOB on exertion recently.   She has a history of an aortic valve replacement and was on anticoagulant therapy initially but is only taking ASA now. Her cardiologist is Dr [place] at [facility].    Current Meds:  Outpatient Encounter Medications as of 02/05/2019  Medication Sig  . aspirin EC 81 MG tablet Take 81 mg by mouth daily.   . cholecalciferol (VITAMIN D3) 25 MCG (1000 UT) tablet Take 1,000 Units by mouth every morning.  . donepezil (ARICEPT) 10 MG tablet Take 1 tablet (10 mg  total) by mouth at bedtime.  Marland Kitchen eplerenone (INSPRA) 25 MG tablet Take 25 mg by mouth every morning.   . furosemide (LASIX) 20 MG tablet Take 20 mg by mouth daily.  Marland Kitchen LORazepam (ATIVAN) 0.5 MG tablet 1 tab every 8 hours as needed for  agitation/anxiety that cannot be redirected. Watch for sedation and risk of falls. (Patient taking differently: Take 0.5 mg by mouth at bedtime. May take 1 tab every 8 hours as needed for agitation/anxiety that cannot be redirected. Watch for sedation and risk of falls.)  . memantine (NAMENDA) 10 MG tablet TAKE (1) TABLET BY MOUTH TWICE DAILY. (Patient taking differently: Take 10 mg by mouth 2 (two) times daily. )  . metoprolol succinate (TOPROL-XL) 25 MG 24 hr tablet Take 25 mg by mouth every morning.   . prednisoLONE acetate (PRED FORTE) 1 % ophthalmic suspension Place 1 drop into the left eye 4 (four) times daily.  . predniSONE (DELTASONE) 10 MG tablet Take 10 mg by mouth daily with breakfast.  . rosuvastatin (CRESTOR) 5 MG tablet Take 5 mg by mouth every morning.   . sertraline (ZOLOFT) 25 MG tablet Take 25 mg by mouth every morning.   . valACYclovir (VALTREX) 1000 MG tablet Take 1 g by mouth every morning.  . senna-docusate (SENOKOT-S) 8.6-50 MG tablet Take 2 tablets by mouth at bedtime.  . traMADol (ULTRAM) 50 MG tablet Take 1 tablet (50 mg total) by mouth every 6 (six) hours as needed for severe pain. For AFTER surgery, do not take and drive  . [DISCONTINUED] cephALEXin (KEFLEX) 500 MG capsule Take 1 capsule (500 mg total) by mouth 4 (four) times daily. For one day. May take 2 as initial dose. (Patient not taking: Reported on 02/05/2019)   No facility-administered encounter medications on file as of 02/05/2019.     Allergy:  Allergies  Allergen Reactions  . Atorvastatin Other (See Comments)    Muscle pain    Social Hx:   Social History   Socioeconomic History  . Marital status: Widowed    Spouse name: Not on file  . Number of children: 1  . Years of education: 12+  . Highest education level: Not on file  Occupational History  . Occupation: Retired  Scientific laboratory technician  . Financial resource strain: Not on file  . Food insecurity:    Worry: Not on file    Inability: Not on file  .  Transportation needs:    Medical: Not on file    Non-medical: Not on file  Tobacco Use  . Smoking status: Never Smoker  . Smokeless tobacco: Never Used  Substance and Sexual Activity  . Alcohol use: No    Alcohol/week: 0.0 standard drinks  . Drug use: No  . Sexual activity: Not Currently  Lifestyle  . Physical activity:    Days per week: Not on file    Minutes per session: Not on file  . Stress: Not on file  Relationships  . Social connections:    Talks on phone: Not on file    Gets together: Not on file    Attends religious service: Not on file    Active member of club or organization: Not on file    Attends meetings of clubs or organizations: Not on file    Relationship status: Not on file  . Intimate partner violence:    Fear of current or ex partner: Not on file    Emotionally abused: Not on file    Physically abused: Not  on file    Forced sexual activity: Not on file  Other Topics Concern  . Not on file  Social History Narrative   Patient lives at home alone.    Patient is widowed.    Patient has 1 child   Patient has 2 years of college   Patient is right handed    Caffeine use: 2-3 glasses diet green tea per day   No soda     Past Surgical Hx:  Past Surgical History:  Procedure Laterality Date  . AORTIC VALVE REPLACEMENT  2012  . EMPYEMA DRAINAGE  2017  . TUBAL LIGATION    . VIDEO ASSISTED THORACOSCOPY (VATS)/EMPYEMA Right 09/07/2016   Procedure: VIDEO ASSISTED THORACOSCOPY (VATS)/EMPYEMA;  Surgeon: Gaye Pollack, MD;  Location: [facility] OR;  Service: Thoracic;  Laterality: Right;    Past Medical Hx:  Past Medical History:  Diagnosis Date  . Carpal tunnel syndrome   . Dementia ([place])   . Dementia ([place]) 2018  . Depression   . H/O aortic valve replacement   . High blood pressure   . High cholesterol   . Pleural effusion   . Shingles 2019   L eye    Past Gynecological History:  SVD x 1 No LMP recorded. Patient has had a hysterectomy.  Family Hx:   Family History  Problem Relation Age of Onset  . Hypertension Mother   . Cancer Father        head and neck   . Dementia Sister     Review of Systems:  Constitutional  Feels well,    ENT Normal appearing ears and nares bilaterally Skin/Breast  No rash, sores, jaundice, itching, dryness Cardiovascular  No chest pain, shortness of breath, or edema  Pulmonary  No cough or wheeze.  Gastro Intestinal  No nausea, vomitting, or diarrhoea. No bright red blood per rectum, no abdominal pain, change in bowel movement, or constipation.  Genito Urinary  No frequency, urgency, dysuria, + postmenopausal bleeding Musculo Skeletal  No myalgia, arthralgia, joint swelling or pain  Neurologic  No weakness, numbness, change in gait,  Psychology  No depression, anxiety, insomnia. + dementia  Vitals:  Blood pressure (!) 149/60, pulse 76, temperature 98.3 F (36.8 C), temperature source Oral, resp. rate 20, height 5\' 2"  (1.575 m), weight 221 lb (100.2 kg), SpO2 99 %.  Physical Exam: WD in NAD Neck  Supple NROM, without any enlargements.  Lymph Node Survey No cervical supraclavicular or inguinal adenopathy Cardiovascular  Pulse normal rate, regularity and rhythm. S1 and S2 normal.  Lungs  Clear to auscultation bilateraly, without wheezes/crackles/rhonchi. Good air movement.  Skin  No rash/lesions/breakdown  Psychiatry  Alert and oriented to person, place, and time  Abdomen  Normoactive bowel sounds, abdomen soft, non-tender and obese without evidence of hernia. Back No CVA tenderness Genito Urinary  Vulva/vagina: Normal external female genitalia.  No lesions. No discharge or bleeding.  Bladder/urethra:  No lesions or masses, well supported bladder  Vagina: normal  Cervix: Normal appearing, no lesions.  Uterus:  Small, mobile, no parametrial involvement or nodularity.  Adnexa: no palpable masses. Rectal  deferred Extremities  No bilateral cyanosis, clubbing or edema.   Thereasa Solo, MD  02/05/2019, 3:22 PM

## 2019-02-05 NOTE — Patient Instructions (Signed)
Preparing for your Surgery  Plan for surgery on February 25, 2019 with Dr. Everitt Amber at [facility] will be scheduled for a robotic assisted total laparoscopic hysterectomy, bilateral salpingo-oophorectomy, sentinel lymph node biopsy.   Pre-operative Testing -You will receive a phone call from presurgical testing at [facility] if you have not received a call already to arrange for a pre-operative testing appointment before your surgery.  This appointment normally occurs one to two weeks before your scheduled surgery.   -Bring your insurance card, copy of an advanced directive if applicable, medication list  -At that visit, you will be asked to sign a consent for a possible blood transfusion in case a transfusion becomes necessary during surgery.  The need for a blood transfusion is rare but having consent is a necessary part of your care.     -YOU CAN STAY ON YOUR ASPIRIN 81 MG.  -As part of our enhanced surgical recovery pathway, you may be advised to drink a carbohydrate drink the morning of surgery (at least 3 hours before). If you are diabetic, this will be avoided in order to prevent elevated glucose levels.  Day Before Surgery at [place] will be asked to take in a light diet the day before surgery.  Avoid carbonated beverages.  You will be advised to have nothing to eat or drink after midnight the evening before.    Eat a light diet the day before surgery.  Examples including soups, broths, toast, yogurt, mashed potatoes.  Things to avoid include carbonated beverages (fizzy beverages), raw fruits and raw vegetables, or beans.   If your bowels are filled with gas, your surgeon will have difficulty visualizing your pelvic organs which increases your surgical risks.  Your role in recovery Your role is to become active as soon as directed by your doctor, while still giving yourself time to heal.  Rest when you feel tired. You will be asked to do the following in order  to speed your recovery:  - Cough and breathe deeply. This helps toclear and expand your lungs and can prevent pneumonia. You may be given a spirometer to practice deep breathing. A staff member will show you how to use the spirometer. - Do mild physical activity. Walking or moving your legs help your circulation and body functions return to normal. A staff member will help you when you try to walk and will provide you with simple exercises. Do not try to get up or walk alone the first time. - Actively manage your pain. Managing your pain lets you move in comfort. We will ask you to rate your pain on a scale of zero to 10. It is your responsibility to tell your doctor or nurse where and how much you hurt so your pain can be treated.  Special Considerations -If you are diabetic, you may be placed on insulin after surgery to have closer control over your blood sugars to promote healing and recovery.  This does not mean that you will be discharged on insulin.  If applicable, your oral antidiabetics will be resumed when you are tolerating a solid diet.  -Your final pathology results from surgery should be available around one week after surgery and the results will be relayed to you when available.  -Dr. Lahoma Crocker is the Surgeon that assists your GYN Oncologist with surgery.  The next day after your surgery you will either see Dr. Denman George or Dr. Lahoma Crocker.  -FMLA forms can be faxed to  [phone] and please allow 5-7 business days for completion.  Pain Management After Surgery -You have been prescribed your pain medication and bowel regimen medications before surgery so that you can have these available when you are discharged from the hospital. The pain medication is for use ONLY AFTER surgery and a new prescription will not be given.   -Make sure that you have Tylenol and Ibuprofen at home to use on a regular basis after surgery for pain control. We recommend alternating the  medications every hour to six hours since they work differently and are processed in the body differently for pain relief.  -Review the attached handout on narcotic use and their risks and side effects.   Bowel Regimen -You have been prescribed Sennakot-S to take nightly to prevent constipation especially if you are taking the narcotic pain medication intermittently.  It is important to prevent constipation and drink adequate amounts of liquids.  Blood Transfusion Information WHAT IS A BLOOD TRANSFUSION? A transfusion is the replacement of blood or some of its parts. Blood is made up of multiple cells which provide different functions.  Red blood cells carry oxygen and are used for blood loss replacement.  White blood cells fight against infection.  Platelets control bleeding.  Plasma helps clot blood.  Other blood products are available for specialized needs, such as hemophilia or other clotting disorders. BEFORE THE TRANSFUSION  Who gives blood for transfusions?   You may be able to donate blood to be used at a later date on yourself (autologous donation).  Relatives can be asked to donate blood. This is generally not any safer than if you have received blood from a stranger. The same precautions are taken to ensure safety when a relative's blood is donated.  Healthy volunteers who are fully evaluated to make sure their blood is safe. This is blood bank blood. Transfusion therapy is the safest it has ever been in the practice of medicine. Before blood is taken from a donor, a complete history is taken to make sure that person has no history of diseases nor engages in risky social behavior (examples are intravenous drug use or sexual activity with multiple partners). The donor's travel history is screened to minimize risk of transmitting infections, such as malaria. The donated blood is tested for signs of infectious diseases, such as HIV and hepatitis. The blood is then tested to be  sure it is compatible with you in order to minimize the chance of a transfusion reaction. If you or a relative donates blood, this is often done in anticipation of surgery and is not appropriate for emergency situations. It takes many days to process the donated blood. RISKS AND COMPLICATIONS Although transfusion therapy is very safe and saves many lives, the main dangers of transfusion include:   Getting an infectious disease.  Developing a transfusion reaction. This is an allergic reaction to something in the blood you were given. Every precaution is taken to prevent this. The decision to have a blood transfusion has been considered carefully by your caregiver before blood is given. Blood is not given unless the benefits outweigh the risks.

## 2019-02-08 ENCOUNTER — Other Ambulatory Visit: Payer: Self-pay | Admitting: Gynecologic Oncology

## 2019-02-08 ENCOUNTER — Telehealth: Payer: Self-pay | Admitting: Oncology

## 2019-02-08 DIAGNOSIS — C541 Malignant neoplasm of endometrium: Secondary | ICD-10-CM

## 2019-02-08 NOTE — Telephone Encounter (Signed)
Faxed cardiac clearance form to Dr. Doristine Devoid office at [facility] at number [phone].

## 2019-02-09 ENCOUNTER — Telehealth: Payer: Self-pay | Admitting: Oncology

## 2019-02-09 DIAGNOSIS — Z4800 Encounter for change or removal of nonsurgical wound dressing: Secondary | ICD-10-CM | POA: Diagnosis not present

## 2019-02-09 DIAGNOSIS — L03116 Cellulitis of left lower limb: Secondary | ICD-10-CM | POA: Diagnosis not present

## 2019-02-09 DIAGNOSIS — I10 Essential (primary) hypertension: Secondary | ICD-10-CM | POA: Diagnosis not present

## 2019-02-09 DIAGNOSIS — Z7982 Long term (current) use of aspirin: Secondary | ICD-10-CM | POA: Diagnosis not present

## 2019-02-09 NOTE — Telephone Encounter (Signed)
Received completed surgical clearance form from Dr. Alethia Berthold at [facility] requesting that patient make an appointment before clearance can be given.    Called patient's daughter, [name] and she said she will call to make the appointment.  Discussed that it is important that she is seen soon so that she is cleared before 02/25/19.

## 2019-02-10 ENCOUNTER — Telehealth: Payer: Self-pay | Admitting: Oncology

## 2019-02-10 NOTE — Telephone Encounter (Signed)
Called Dr. Leverne Humbles office and requested the last labs are faxed to [place].

## 2019-02-15 DIAGNOSIS — R0602 Shortness of breath: Secondary | ICD-10-CM | POA: Diagnosis not present

## 2019-02-15 DIAGNOSIS — I129 Hypertensive chronic kidney disease with stage 1 through stage 4 chronic kidney disease, or unspecified chronic kidney disease: Secondary | ICD-10-CM | POA: Diagnosis not present

## 2019-02-15 DIAGNOSIS — L97221 Non-pressure chronic ulcer of left calf limited to breakdown of skin: Secondary | ICD-10-CM | POA: Diagnosis not present

## 2019-02-15 DIAGNOSIS — Z01810 Encounter for preprocedural cardiovascular examination: Secondary | ICD-10-CM | POA: Diagnosis not present

## 2019-02-15 DIAGNOSIS — N183 Chronic kidney disease, stage 3 (moderate): Secondary | ICD-10-CM | POA: Diagnosis not present

## 2019-02-15 DIAGNOSIS — C541 Malignant neoplasm of endometrium: Secondary | ICD-10-CM | POA: Diagnosis not present

## 2019-02-15 DIAGNOSIS — Z953 Presence of xenogenic heart valve: Secondary | ICD-10-CM | POA: Diagnosis not present

## 2019-02-15 DIAGNOSIS — I359 Nonrheumatic aortic valve disorder, unspecified: Secondary | ICD-10-CM | POA: Diagnosis not present

## 2019-02-15 DIAGNOSIS — I10 Essential (primary) hypertension: Secondary | ICD-10-CM | POA: Diagnosis not present

## 2019-02-15 DIAGNOSIS — I517 Cardiomegaly: Secondary | ICD-10-CM | POA: Diagnosis not present

## 2019-02-15 DIAGNOSIS — I872 Venous insufficiency (chronic) (peripheral): Secondary | ICD-10-CM | POA: Diagnosis not present

## 2019-02-15 DIAGNOSIS — R600 Localized edema: Secondary | ICD-10-CM | POA: Diagnosis not present

## 2019-02-15 DIAGNOSIS — R2243 Localized swelling, mass and lump, lower limb, bilateral: Secondary | ICD-10-CM | POA: Diagnosis not present

## 2019-02-16 ENCOUNTER — Telehealth: Payer: Self-pay | Admitting: Oncology

## 2019-02-16 NOTE — Telephone Encounter (Signed)
Received fax with office note from [facility] clearing patient for surgery on 02/25/19.

## 2019-02-17 DIAGNOSIS — I10 Essential (primary) hypertension: Secondary | ICD-10-CM | POA: Diagnosis not present

## 2019-02-17 DIAGNOSIS — Z4800 Encounter for change or removal of nonsurgical wound dressing: Secondary | ICD-10-CM | POA: Diagnosis not present

## 2019-02-17 DIAGNOSIS — Z7982 Long term (current) use of aspirin: Secondary | ICD-10-CM | POA: Diagnosis not present

## 2019-02-17 DIAGNOSIS — L03116 Cellulitis of left lower limb: Secondary | ICD-10-CM | POA: Diagnosis not present

## 2019-02-18 NOTE — Patient Instructions (Addendum)
Judith Schroeder  02/18/2019   Your procedure is scheduled on: Thursday 02/25/2019  Report to [facility] Main  Entrance                Report to Short Stay at  [place]  AM    Call this number if you have problems the morning of surgery [phone]    Eat a light diet the day before surgery.  Examples including soups, broths, toast, yogurt, mashed potatoes.  Things to avoid include carbonated beverages (fizzy beverages), raw fruits and raw vegetables, or beans.   If your bowels are filled with gas, your surgeon will have difficulty visualizing your pelvic organs which increases your surgical risks.      Remember :No solid foods after midnight!   NO SOLID FOOD AFTER MIDNIGHT THE NIGHT PRIOR TO SURGERY. NOTHING BY MOUTH EXCEPT CLEAR LIQUIDS UNTIL 3 HOURS PRIOR TO [place] SURGERY. PLEASE FINISH ENSURE DRINK PER SURGEON ORDER 3 HOURS PRIOR TO SCHEDULED SURGERY TIME WHICH NEEDS TO BE COMPLETED AT 0430 am.    CLEAR LIQUID DIET   Foods Allowed                                                                     Foods Excluded  Coffee and tea, regular and decaf                             liquids that you cannot  Plain Jell-O in any flavor                                             see through such as: Fruit ices (not with fruit pulp)                                     milk, soups, orange juice  Iced Popsicles                                    All solid food Carbonated beverages, regular and diet                                    Cranberry, grape and apple juices Sports drinks like Gatorade Lightly seasoned clear broth or consume(fat free) Sugar, honey syrup  Sample Menu Breakfast                                Lunch                                     Supper Cranberry juice  Beef broth                            Chicken broth Jell-O                                     Grape juice                           Apple juice Coffee or tea                         Jell-O                                      Popsicle                                                Coffee or tea                        Coffee or tea  _____________________________________________________________________                BRUSH YOUR TEETH MORNING OF SURGERY AND RINSE YOUR MOUTH OUT, NO CHEWING GUM CANDY OR MINTS.     Take these medicines the morning of surgery with A SIP OF WATER: Memantine (Namenda), Metoprolol succinate (Toprol-XL), use eye drops, Rosuvastatin (Crestor)< Sertraline (Zoloft), Aspirin                                 You may not have any metal on your body including hair pins and              piercings  Do not wear jewelry, make-up, lotions, powders or perfumes, deodorant             Do not wear nail polish.  Do not shave  48 hours prior to surgery.               Do not bring valuables to the hospital. [place].  Contacts, dentures or bridgework may not be worn into surgery.  Leave suitcase in the car. After surgery it may be brought to your room.                    Please read over the following fact sheets you were given: _____________________________________________________________________             [facility] - Preparing for Surgery Before surgery, you can play an important role.  Because skin is not sterile, your skin needs to be as free of germs as possible.  You can reduce the number of germs on your skin by washing with CHG (chlorahexidine gluconate) soap before surgery.  CHG is an antiseptic cleaner which kills germs and bonds with the skin to continue killing germs even after washing. Please DO NOT use if you have an allergy to CHG or antibacterial soaps.  If your skin  becomes reddened/irritated stop using the CHG and inform your nurse when you arrive at Short Stay. Do not shave (including legs and underarms) for at least 48 hours prior to the first CHG shower.  You may shave  your face/neck. Please follow these instructions carefully:  1.  Shower with CHG Soap the night before surgery and the  morning of Surgery.  2.  If you choose to wash your hair, wash your hair first as usual with your  normal  shampoo.  3.  After you shampoo, rinse your hair and body thoroughly to remove the  shampoo.                           4.  Use CHG as you would any other liquid soap.  You can apply chg directly  to the skin and wash                       Gently with a scrungie or clean washcloth.  5.  Apply the CHG Soap to your body ONLY FROM THE NECK DOWN.   Do not use on face/ open                           Wound or open sores. Avoid contact with eyes, ears mouth and genitals (private parts).                       Wash face,  Genitals (private parts) with your normal soap.             6.  Wash thoroughly, paying special attention to the area where your surgery  will be performed.  7.  Thoroughly rinse your body with warm water from the neck down.  8.  DO NOT shower/wash with your normal soap after using and rinsing off  the CHG Soap.                9.  Pat yourself dry with a clean towel.            10.  Wear clean pajamas.            11.  Place clean sheets on your bed the night of your first shower and do not  sleep with pets. Day of Surgery : Do not apply any lotions/deodorants the morning of surgery.  Please wear clean clothes to the hospital/surgery center.  FAILURE TO FOLLOW THESE INSTRUCTIONS MAY RESULT IN THE CANCELLATION OF YOUR SURGERY PATIENT SIGNATURE_________________________________  NURSE SIGNATURE__________________________________  ________________________________________________________________________

## 2019-02-19 ENCOUNTER — Other Ambulatory Visit: Payer: Self-pay

## 2019-02-19 ENCOUNTER — Encounter (HOSPITAL_COMMUNITY)
Admission: RE | Admit: 2019-02-19 | Discharge: 2019-02-19 | Disposition: A | Discharge status: Home / Self Care | Payer: Medicare Other | Source: Ambulatory Visit | Attending: Gynecologic Oncology | Admitting: Gynecologic Oncology

## 2019-02-19 ENCOUNTER — Encounter (HOSPITAL_COMMUNITY): Payer: Self-pay

## 2019-02-19 DIAGNOSIS — C541 Malignant neoplasm of endometrium: Secondary | ICD-10-CM | POA: Insufficient documentation

## 2019-02-19 DIAGNOSIS — Z01818 Encounter for other preprocedural examination: Secondary | ICD-10-CM | POA: Diagnosis not present

## 2019-02-19 DIAGNOSIS — F329 Major depressive disorder, single episode, unspecified: Secondary | ICD-10-CM | POA: Diagnosis not present

## 2019-02-19 DIAGNOSIS — Z7982 Long term (current) use of aspirin: Secondary | ICD-10-CM | POA: Diagnosis not present

## 2019-02-19 DIAGNOSIS — Z6838 Body mass index (BMI) 38.0-38.9, adult: Secondary | ICD-10-CM | POA: Diagnosis not present

## 2019-02-19 DIAGNOSIS — Z952 Presence of prosthetic heart valve: Secondary | ICD-10-CM | POA: Diagnosis not present

## 2019-02-19 DIAGNOSIS — Z7952 Long term (current) use of systemic steroids: Secondary | ICD-10-CM | POA: Diagnosis not present

## 2019-02-19 DIAGNOSIS — E7800 Pure hypercholesterolemia, unspecified: Secondary | ICD-10-CM | POA: Diagnosis not present

## 2019-02-19 DIAGNOSIS — G309 Alzheimer's disease, unspecified: Secondary | ICD-10-CM | POA: Diagnosis not present

## 2019-02-19 DIAGNOSIS — Z79899 Other long term (current) drug therapy: Secondary | ICD-10-CM | POA: Diagnosis not present

## 2019-02-19 DIAGNOSIS — R9431 Abnormal electrocardiogram [ECG] [EKG]: Secondary | ICD-10-CM | POA: Insufficient documentation

## 2019-02-19 DIAGNOSIS — Z888 Allergy status to other drugs, medicaments and biological substances status: Secondary | ICD-10-CM | POA: Diagnosis not present

## 2019-02-19 DIAGNOSIS — N9489 Other specified conditions associated with female genital organs and menstrual cycle: Secondary | ICD-10-CM | POA: Diagnosis not present

## 2019-02-19 DIAGNOSIS — I10 Essential (primary) hypertension: Secondary | ICD-10-CM | POA: Insufficient documentation

## 2019-02-19 DIAGNOSIS — F0280 Dementia in other diseases classified elsewhere without behavioral disturbance: Secondary | ICD-10-CM | POA: Diagnosis not present

## 2019-02-19 HISTORY — DX: Malignant (primary) neoplasm, unspecified: C80.1

## 2019-02-19 LAB — COMPREHENSIVE METABOLIC PANEL
ALT: 17 U/L (ref 0–44)
AST: 25 U/L (ref 15–41)
Albumin: 4 g/dL (ref 3.5–5.0)
Alkaline Phosphatase: 92 U/L (ref 38–126)
Anion gap: 10 (ref 5–15)
BUN: 18 mg/dL (ref 8–23)
CO2: 26 mmol/L (ref 22–32)
Calcium: 8.9 mg/dL (ref 8.9–10.3)
Chloride: 104 mmol/L (ref 98–111)
Creatinine, Ser: 1.13 mg/dL — ABNORMAL HIGH (ref 0.44–1.00)
GFR calc Af Amer: 56 mL/min — ABNORMAL LOW (ref >60)
GFR calc non Af Amer: 49 mL/min — ABNORMAL LOW (ref >60)
GLUCOSE: 107 mg/dL — AB (ref 70–99)
Potassium: 4.1 mmol/L (ref 3.5–5.1)
Sodium: 140 mmol/L (ref 135–145)
Total Bilirubin: 1.3 mg/dL — ABNORMAL HIGH (ref 0.3–1.2)
Total Protein: 7 g/dL (ref 6.5–8.1)

## 2019-02-19 LAB — URINALYSIS, ROUTINE W REFLEX MICROSCOPIC
BILIRUBIN URINE: NEGATIVE
Glucose, UA: NEGATIVE mg/dL
Hgb urine dipstick: NEGATIVE
KETONES UR: NEGATIVE mg/dL
NITRITE: NEGATIVE
Protein, ur: NEGATIVE mg/dL
Specific Gravity, Urine: 1.013 (ref 1.005–1.030)
pH: 5 (ref 5.0–8.0)

## 2019-02-19 LAB — CBC
HCT: 45.8 % (ref 36.0–46.0)
Hemoglobin: 14.5 g/dL (ref 12.0–15.0)
MCH: 33.2 pg (ref 26.0–34.0)
MCHC: 31.7 g/dL (ref 30.0–36.0)
MCV: 104.8 fL — ABNORMAL HIGH (ref 80.0–100.0)
Platelets: 245 10*3/uL (ref 150–400)
RBC: 4.37 MIL/uL (ref 3.87–5.11)
RDW: 12.2 % (ref 11.5–15.5)
WBC: 9 10*3/uL (ref 4.0–10.5)
nRBC: 0 % (ref 0.0–0.2)

## 2019-02-19 LAB — ABO/RH: ABO/RH(D): O POS

## 2019-02-19 NOTE — Progress Notes (Signed)
02/15/2019- noted in Epic-Cardiology Office note from Dr. Alethia Berthold  and ECHO doppler I Care Everywhere  09/06/2016- noted in [place]- ECHO

## 2019-02-20 LAB — URINE CULTURE

## 2019-02-21 DIAGNOSIS — Z7982 Long term (current) use of aspirin: Secondary | ICD-10-CM | POA: Diagnosis not present

## 2019-02-21 DIAGNOSIS — L03116 Cellulitis of left lower limb: Secondary | ICD-10-CM | POA: Diagnosis not present

## 2019-02-21 DIAGNOSIS — S81802D Unspecified open wound, left lower leg, subsequent encounter: Secondary | ICD-10-CM | POA: Diagnosis not present

## 2019-02-21 DIAGNOSIS — I10 Essential (primary) hypertension: Secondary | ICD-10-CM | POA: Diagnosis not present

## 2019-02-21 DIAGNOSIS — Z4800 Encounter for change or removal of nonsurgical wound dressing: Secondary | ICD-10-CM | POA: Diagnosis not present

## 2019-02-21 DIAGNOSIS — L03011 Cellulitis of right finger: Secondary | ICD-10-CM | POA: Diagnosis not present

## 2019-02-21 DIAGNOSIS — F0390 Unspecified dementia without behavioral disturbance: Secondary | ICD-10-CM | POA: Diagnosis not present

## 2019-02-23 DIAGNOSIS — F0390 Unspecified dementia without behavioral disturbance: Secondary | ICD-10-CM | POA: Diagnosis not present

## 2019-02-23 DIAGNOSIS — L03116 Cellulitis of left lower limb: Secondary | ICD-10-CM | POA: Diagnosis not present

## 2019-02-23 DIAGNOSIS — Z7982 Long term (current) use of aspirin: Secondary | ICD-10-CM | POA: Diagnosis not present

## 2019-02-23 DIAGNOSIS — S81802D Unspecified open wound, left lower leg, subsequent encounter: Secondary | ICD-10-CM | POA: Diagnosis not present

## 2019-02-23 DIAGNOSIS — I10 Essential (primary) hypertension: Secondary | ICD-10-CM | POA: Diagnosis not present

## 2019-02-23 DIAGNOSIS — Z4800 Encounter for change or removal of nonsurgical wound dressing: Secondary | ICD-10-CM | POA: Diagnosis not present

## 2019-02-23 NOTE — Progress Notes (Signed)
Anesthesia Chart Review   Case:  [id number] Date/Time:  02/25/19 0700   Procedures:      XI ROBOTIC ASSISTED TOTAL HYSTERECTOMY WITH BILATERAL SALPINGO OOPHORECTOMY (Bilateral )     SENTINE LYMPH NODE BIOPSY (N/A )   Anesthesia type:  General   Pre-op diagnosis:  ENDOMETRIAL CANCER   Location:  [facility] 02 / [facility] ORS   Surgeon:  Everitt Amber, MD      DISCUSSION: Judith yo never smoker with h/o Aortic valve replacement (2011), dementia, HTN, depression, endometrial cancer scheduled for above procedure 02/25/19 with Dr. Everitt Amber.   Pt last seen by cardiology 02/16/19.  Seen by Elnora Morrison, PA-C.  Per her note, "Given his cardiac evaluation, there are no contraindications to the upcoming surgery.  She appears to be optimized from a cardiac standpoint.  The lower extremity swelling is not going to deter this procedure.  No further cardiac testing is warranted prior to upcoming surgery.  She is of moderate risk just given her prior cardiac history, but she is stable and without symptoms at this time.  Echo with stable anatomy and normal ejection fraction.  Valve functioning well.  She may continue aspirin perioperatively.  Hold Lasix the day of surgery."  Pt can proceed with planned procedure barring acute status change.  VS: BP (!) 172/77   Pulse 81   Temp 36.4 C (Oral)   Resp 18   Ht 5\' 3"  (1.6 m)   Wt 97.6 kg   SpO2 97%   BMI 38.12 kg/m   PROVIDERS: [name], MD is PCP   Samella Parr, MD is cardiologist  LABS: Labs reviewed: Acceptable for surgery. (all labs ordered are listed, but only abnormal results are displayed)  Labs Reviewed  CBC - Abnormal; Notable for the following components:      Result Value   MCV 104.8 (*)    All other components within normal limits  COMPREHENSIVE METABOLIC PANEL - Abnormal; Notable for the following components:   Glucose, Bld 107 (*)    Creatinine, Ser 1.13 (*)    Total Bilirubin 1.3 (*)    GFR calc non Af Amer 49 (*)    GFR calc Af Amer 56 (*)     All other components within normal limits  URINALYSIS, ROUTINE W REFLEX MICROSCOPIC - Abnormal; Notable for the following components:   APPearance HAZY (*)    Leukocytes,Ua MODERATE (*)    Bacteria, UA RARE (*)    All other components within normal limits  TYPE AND SCREEN  ABO/RH     IMAGES:   EKG: 02/19/2019 Rate 76 bpm Normal sinus rhythm  Anteroseptal infarct, age undetermined Abnormal ECG Since last tracing no significant change   CV: Echo 09/06/16 Study Conclusions  - Left ventricle: The cavity size was normal. Wall thickness was   increased in a pattern of moderate LVH. Systolic function was   normal. The estimated ejection fraction was in the range of 60%   to 65%. Wall motion was normal; there were no regional wall   motion abnormalities. Features are consistent with a pseudonormal   left ventricular filling pattern, with concomitant abnormal   relaxation and increased filling pressure (grade 2 diastolic   dysfunction). Doppler parameters are consistent with high   ventricular filling pressure. - Aortic valve: There is a 59mm Magna pericardial valve in the AV   position. Normal gradients across prosthetic valve. Mean gradient   (S): 12 mm Hg. Valve area (VTI): 2.12 cm^2. Valve area (Vmax):  1.84 cm^2. - Left atrium: The atrium was moderately dilated. - Pulmonary arteries: Systolic pressure was mildly increased. PA   peak pressure: 38 mm Hg (S). - Systemic veins: IVC is small, suggesting low RA pressure and and   hypovolemia. - Technically adequate study. Past Medical History:  Diagnosis Date  . Cancer Judith Schroeder)    endometrial cancer  . Carpal tunnel syndrome   . Dementia ([place])   . Dementia ([place]) 2018  . Depression   . H/O aortic valve replacement   . High blood pressure   . High cholesterol   . Pleural effusion   . Shingles 2019   L eye    Past Surgical History:  Procedure Laterality Date  . AORTIC VALVE REPLACEMENT  2012  . CARDIAC VALVE  REPLACEMENT     aortic  . DILATION AND CURETTAGE OF UTERUS    . EMPYEMA DRAINAGE  2017  . TUBAL LIGATION    . VIDEO ASSISTED THORACOSCOPY (VATS)/EMPYEMA Right 09/07/2016   Procedure: VIDEO ASSISTED THORACOSCOPY (VATS)/EMPYEMA;  Surgeon: Gaye Pollack, MD;  Location: [facility] OR;  Service: Thoracic;  Laterality: Right;    MEDICATIONS: . aspirin EC 81 MG tablet  . cholecalciferol (VITAMIN D3) 25 MCG (1000 UT) tablet  . donepezil (ARICEPT) 10 MG tablet  . eplerenone (INSPRA) 25 MG tablet  . furosemide (LASIX) 20 MG tablet  . LORazepam (ATIVAN) 0.5 MG tablet  . magnesium oxide (MAG-OX) 400 MG tablet  . memantine (NAMENDA) 10 MG tablet  . metoprolol succinate (TOPROL-XL) 25 MG 24 hr tablet  . prednisoLONE acetate (PRED FORTE) 1 % ophthalmic suspension  . rosuvastatin (CRESTOR) 5 MG tablet  . senna-docusate (SENOKOT-S) 8.6-50 MG tablet  . sertraline (ZOLOFT) 25 MG tablet  . traMADol (ULTRAM) 50 MG tablet  . valACYclovir (VALTREX) 1000 MG tablet   No current facility-administered medications for this encounter.    Judith Felix, PA-C [facility] Pre-Surgical Testing [phone] 02/23/19 11:40 AM

## 2019-02-23 NOTE — Anesthesia Preprocedure Evaluation (Addendum)
Anesthesia Evaluation  Patient identified by MRN, date of birth, ID band Patient awake    Reviewed: Allergy & Precautions, NPO status , Patient's Chart, lab work & pertinent test results, reviewed documented beta blocker date and time   History of Anesthesia Complications Negative for: history of anesthetic complications  Airway Mallampati: II  TM Distance: >3 FB Neck ROM: Full    Dental  (+) Dental Advisory Given   Pulmonary neg pulmonary ROS,    breath sounds clear to auscultation       Cardiovascular hypertension, Pt. on medications and Pt. on home beta blockers (-) angina(-) Past MI + Valvular Problems/Murmurs  Rhythm:Regular     Neuro/Psych PSYCHIATRIC DISORDERS Depression Dementia  Neuromuscular disease    GI/Hepatic negative GI ROS, Neg liver ROS,   Endo/Other  Morbid obesity  Renal/GU negative Renal ROS     Musculoskeletal negative musculoskeletal ROS (+)   Abdominal   Peds  Hematology negative hematology ROS (+)   Anesthesia Other Findings 73 yo never smoker with h/o Aortic valve replacement (2011), dementia, HTN, depression, endometrial cancer scheduled for above procedure 02/25/19 with Dr. Everitt Amber.   Pt last seen by cardiology 02/16/19.  Seen by Elnora Morrison, PA-C.  Per her note, "Given his cardiac evaluation, there are no contraindications to the upcoming surgery.  She appears to be optimized from a cardiac standpoint.  The lower extremity swelling is not going to deter this procedure.  No further cardiac testing is warranted prior to upcoming surgery.  She is of moderate risk just given her prior cardiac history, but she is stable and without symptoms at this time.  Echo with stable anatomy and normal ejection fraction.  Valve functioning well.  She may continue aspirin perioperatively.  Hold Lasix the day of surgery."  Reproductive/Obstetrics                           Anesthesia  Physical Anesthesia Plan  ASA: III  Anesthesia Plan: General   Post-op Pain Management:    Induction: Intravenous  PONV Risk Score and Plan: 3 and Ondansetron and Dexamethasone  Airway Management Planned: Oral ETT  Additional Equipment: None  Intra-op Plan:   Post-operative Plan: Extubation in OR  Informed Consent: I have reviewed the patients History and Physical, chart, labs and discussed the procedure including the risks, benefits and alternatives for the proposed anesthesia with the patient or authorized representative who has indicated his/her understanding and acceptance.     Dental advisory given  Plan Discussed with: CRNA and Surgeon  Anesthesia Plan Comments: (See PAT note 02/19/19, Konrad Felix, PA-C)       Anesthesia Quick Evaluation

## 2019-02-25 ENCOUNTER — Ambulatory Visit (HOSPITAL_COMMUNITY): Payer: Medicare Other | Admitting: Physician Assistant

## 2019-02-25 ENCOUNTER — Other Ambulatory Visit: Payer: Self-pay

## 2019-02-25 ENCOUNTER — Encounter (HOSPITAL_COMMUNITY)
Admission: RE | Disposition: A | Discharge status: Home / Self Care | Payer: Self-pay | Source: Home / Self Care | Attending: Gynecologic Oncology

## 2019-02-25 ENCOUNTER — Ambulatory Visit (HOSPITAL_COMMUNITY)
Admission: RE | Admit: 2019-02-25 | Discharge: 2019-02-25 | Disposition: A | Discharge status: Home / Self Care | Payer: Medicare Other | Attending: Gynecologic Oncology | Admitting: Gynecologic Oncology

## 2019-02-25 ENCOUNTER — Encounter (HOSPITAL_COMMUNITY): Payer: Self-pay

## 2019-02-25 ENCOUNTER — Ambulatory Visit (HOSPITAL_COMMUNITY): Payer: Medicare Other | Admitting: Anesthesiology

## 2019-02-25 DIAGNOSIS — Z6838 Body mass index (BMI) 38.0-38.9, adult: Secondary | ICD-10-CM | POA: Insufficient documentation

## 2019-02-25 DIAGNOSIS — I10 Essential (primary) hypertension: Secondary | ICD-10-CM | POA: Diagnosis not present

## 2019-02-25 DIAGNOSIS — F329 Major depressive disorder, single episode, unspecified: Secondary | ICD-10-CM | POA: Insufficient documentation

## 2019-02-25 DIAGNOSIS — Z888 Allergy status to other drugs, medicaments and biological substances status: Secondary | ICD-10-CM | POA: Insufficient documentation

## 2019-02-25 DIAGNOSIS — C541 Malignant neoplasm of endometrium: Secondary | ICD-10-CM | POA: Diagnosis not present

## 2019-02-25 DIAGNOSIS — G309 Alzheimer's disease, unspecified: Secondary | ICD-10-CM | POA: Insufficient documentation

## 2019-02-25 DIAGNOSIS — E669 Obesity, unspecified: Secondary | ICD-10-CM

## 2019-02-25 DIAGNOSIS — Z7982 Long term (current) use of aspirin: Secondary | ICD-10-CM | POA: Diagnosis not present

## 2019-02-25 DIAGNOSIS — N9489 Other specified conditions associated with female genital organs and menstrual cycle: Secondary | ICD-10-CM | POA: Diagnosis not present

## 2019-02-25 DIAGNOSIS — Z7952 Long term (current) use of systemic steroids: Secondary | ICD-10-CM | POA: Insufficient documentation

## 2019-02-25 DIAGNOSIS — Z79899 Other long term (current) drug therapy: Secondary | ICD-10-CM | POA: Insufficient documentation

## 2019-02-25 DIAGNOSIS — E7800 Pure hypercholesterolemia, unspecified: Secondary | ICD-10-CM | POA: Insufficient documentation

## 2019-02-25 DIAGNOSIS — Z952 Presence of prosthetic heart valve: Secondary | ICD-10-CM | POA: Insufficient documentation

## 2019-02-25 DIAGNOSIS — F0280 Dementia in other diseases classified elsewhere without behavioral disturbance: Secondary | ICD-10-CM | POA: Diagnosis not present

## 2019-02-25 HISTORY — PX: ROBOTIC ASSISTED TOTAL HYSTERECTOMY WITH BILATERAL SALPINGO OOPHERECTOMY: SHX6086

## 2019-02-25 HISTORY — PX: LYMPH NODE BIOPSY: SHX201

## 2019-02-25 LAB — TYPE AND SCREEN
ABO/RH(D): O POS
Antibody Screen: NEGATIVE

## 2019-02-25 SURGERY — HYSTERECTOMY, TOTAL, ROBOT-ASSISTED, LAPAROSCOPIC, WITH BILATERAL SALPINGO-OOPHORECTOMY
Anesthesia: General

## 2019-02-25 MED ORDER — MORPHINE SULFATE (PF) 4 MG/ML IV SOLN: 2.0000 mg | INTRAVENOUS | Status: DC | PRN

## 2019-02-25 MED ORDER — SODIUM CHLORIDE 0.9% FLUSH: 3.0000 mL | Freq: Two times a day (BID) | INTRAVENOUS | Status: DC

## 2019-02-25 MED ORDER — ACETAMINOPHEN 10 MG/ML IV SOLN: 1000.0000 mg | Freq: Once | INTRAVENOUS | Status: DC | PRN

## 2019-02-25 MED ORDER — ENOXAPARIN SODIUM 40 MG/0.4ML ~~LOC~~ SOLN
40.0000 mg | SUBCUTANEOUS | Status: AC
  Administered 2019-02-25: 40 mg via SUBCUTANEOUS
  Filled 2019-02-25: qty 0.4

## 2019-02-25 MED ORDER — SCOPOLAMINE 1 MG/3DAYS TD PT72
1.0000 | MEDICATED_PATCH | TRANSDERMAL | Status: DC
  Administered 2019-02-25: 1.5 mg via TRANSDERMAL
  Filled 2019-02-25: qty 1

## 2019-02-25 MED ORDER — FENTANYL CITRATE (PF) 100 MCG/2ML IJ SOLN
INTRAMUSCULAR | Status: AC
  Filled 2019-02-25: qty 2

## 2019-02-25 MED ORDER — DEXAMETHASONE SODIUM PHOSPHATE 10 MG/ML IJ SOLN
INTRAMUSCULAR | Status: DC | PRN
  Administered 2019-02-25: 10 mg via INTRAVENOUS

## 2019-02-25 MED ORDER — BUPIVACAINE HCL (PF) 0.25 % IJ SOLN
INTRAMUSCULAR | Status: DC | PRN
  Administered 2019-02-25: 30 mL

## 2019-02-25 MED ORDER — OXYCODONE HCL 5 MG PO TABS: 5.0000 mg | ORAL_TABLET | Freq: Once | ORAL | Status: DC | PRN

## 2019-02-25 MED ORDER — ROCURONIUM BROMIDE 10 MG/ML (PF) SYRINGE
PREFILLED_SYRINGE | INTRAVENOUS | Status: AC
  Filled 2019-02-25: qty 10

## 2019-02-25 MED ORDER — SUGAMMADEX SODIUM 200 MG/2ML IV SOLN
INTRAVENOUS | Status: DC | PRN
  Administered 2019-02-25: 200 mg via INTRAVENOUS

## 2019-02-25 MED ORDER — ACETAMINOPHEN 650 MG RE SUPP
650.0000 mg | RECTAL | Status: DC | PRN
  Filled 2019-02-25: qty 1

## 2019-02-25 MED ORDER — SODIUM CHLORIDE 0.9 % IV SOLN: 250.0000 mL | INTRAVENOUS | Status: DC | PRN

## 2019-02-25 MED ORDER — GABAPENTIN 300 MG PO CAPS
300.0000 mg | ORAL_CAPSULE | ORAL | Status: AC
  Administered 2019-02-25: 300 mg via ORAL
  Filled 2019-02-25: qty 1

## 2019-02-25 MED ORDER — SODIUM CHLORIDE 0.9% FLUSH: 3.0000 mL | INTRAVENOUS | Status: DC | PRN

## 2019-02-25 MED ORDER — BUPIVACAINE HCL (PF) 0.25 % IJ SOLN
INTRAMUSCULAR | Status: AC
  Filled 2019-02-25: qty 30

## 2019-02-25 MED ORDER — SUGAMMADEX SODIUM 200 MG/2ML IV SOLN
INTRAVENOUS | Status: AC
  Filled 2019-02-25: qty 2

## 2019-02-25 MED ORDER — FENTANYL CITRATE (PF) 250 MCG/5ML IJ SOLN
INTRAMUSCULAR | Status: AC
  Filled 2019-02-25: qty 5

## 2019-02-25 MED ORDER — STERILE WATER FOR INJECTION IJ SOLN
INTRAMUSCULAR | Status: DC | PRN
  Administered 2019-02-25: 4 mL

## 2019-02-25 MED ORDER — FENTANYL CITRATE (PF) 100 MCG/2ML IJ SOLN: 25.0000 ug | INTRAMUSCULAR | Status: DC | PRN

## 2019-02-25 MED ORDER — INDOCYANINE GREEN 25 MG IV SOLR
INTRAVENOUS | Status: DC | PRN
  Administered 2019-02-25: 2.5 mg

## 2019-02-25 MED ORDER — PROPOFOL 10 MG/ML IV BOLUS
INTRAVENOUS | Status: AC
  Filled 2019-02-25: qty 20

## 2019-02-25 MED ORDER — LIDOCAINE 2% (20 MG/ML) 5 ML SYRINGE
INTRAMUSCULAR | Status: DC | PRN
  Administered 2019-02-25: 60 mg via INTRAVENOUS

## 2019-02-25 MED ORDER — OXYCODONE HCL 5 MG/5ML PO SOLN: 5.0000 mg | Freq: Once | ORAL | Status: DC | PRN

## 2019-02-25 MED ORDER — ACETAMINOPHEN 325 MG PO TABS: 650.0000 mg | ORAL_TABLET | ORAL | Status: DC | PRN

## 2019-02-25 MED ORDER — DEXAMETHASONE SODIUM PHOSPHATE 4 MG/ML IJ SOLN: 4.0000 mg | INTRAMUSCULAR | Status: DC

## 2019-02-25 MED ORDER — CEFAZOLIN SODIUM-DEXTROSE 2-4 GM/100ML-% IV SOLN
2.0000 g | INTRAVENOUS | Status: AC
  Administered 2019-02-25: 2 g via INTRAVENOUS
  Filled 2019-02-25: qty 100

## 2019-02-25 MED ORDER — ACETAMINOPHEN 500 MG PO TABS
1000.0000 mg | ORAL_TABLET | ORAL | Status: AC
  Administered 2019-02-25: 1000 mg via ORAL
  Filled 2019-02-25: qty 2

## 2019-02-25 MED ORDER — DEXAMETHASONE SODIUM PHOSPHATE 10 MG/ML IJ SOLN
INTRAMUSCULAR | Status: AC
  Filled 2019-02-25: qty 1

## 2019-02-25 MED ORDER — EPHEDRINE SULFATE-NACL 50-0.9 MG/10ML-% IV SOSY
PREFILLED_SYRINGE | INTRAVENOUS | Status: DC | PRN
  Administered 2019-02-25 (×3): 10 mg via INTRAVENOUS

## 2019-02-25 MED ORDER — LIDOCAINE 2% (20 MG/ML) 5 ML SYRINGE
INTRAMUSCULAR | Status: AC
  Filled 2019-02-25: qty 5

## 2019-02-25 MED ORDER — FENTANYL CITRATE (PF) 250 MCG/5ML IJ SOLN
INTRAMUSCULAR | Status: DC | PRN
  Administered 2019-02-25: 50 ug via INTRAVENOUS
  Administered 2019-02-25: 100 ug via INTRAVENOUS
  Administered 2019-02-25: 50 ug via INTRAVENOUS

## 2019-02-25 MED ORDER — ACETAMINOPHEN 500 MG PO TABS: 1000.0000 mg | ORAL_TABLET | Freq: Once | ORAL | Status: DC | PRN

## 2019-02-25 MED ORDER — ONDANSETRON HCL 4 MG/2ML IJ SOLN
INTRAMUSCULAR | Status: AC
  Filled 2019-02-25: qty 2

## 2019-02-25 MED ORDER — PROPOFOL 10 MG/ML IV BOLUS
INTRAVENOUS | Status: DC | PRN
  Administered 2019-02-25: 100 mg via INTRAVENOUS

## 2019-02-25 MED ORDER — ONDANSETRON HCL 4 MG/2ML IJ SOLN
INTRAMUSCULAR | Status: DC | PRN
  Administered 2019-02-25: 4 mg via INTRAVENOUS

## 2019-02-25 MED ORDER — STERILE WATER FOR INJECTION IJ SOLN
INTRAMUSCULAR | Status: AC
  Filled 2019-02-25: qty 10

## 2019-02-25 MED ORDER — ROCURONIUM BROMIDE 100 MG/10ML IV SOLN
INTRAVENOUS | Status: DC | PRN
  Administered 2019-02-25: 50 mg via INTRAVENOUS
  Administered 2019-02-25: 10 mg via INTRAVENOUS

## 2019-02-25 MED ORDER — ACETAMINOPHEN 160 MG/5ML PO SOLN: 1000.0000 mg | Freq: Once | ORAL | Status: DC | PRN

## 2019-02-25 MED ORDER — OXYCODONE HCL 5 MG PO TABS: 5.0000 mg | ORAL_TABLET | ORAL | Status: DC | PRN

## 2019-02-25 MED ORDER — HYDRALAZINE HCL 20 MG/ML IJ SOLN
INTRAMUSCULAR | Status: AC
  Filled 2019-02-25: qty 1

## 2019-02-25 MED ORDER — LACTATED RINGERS IV SOLN
INTRAVENOUS | Status: DC
  Administered 2019-02-25 (×2): via INTRAVENOUS

## 2019-02-25 MED ORDER — LACTATED RINGERS IR SOLN
Status: DC | PRN
  Administered 2019-02-25: 1000 mL

## 2019-02-25 SURGICAL SUPPLY — 52 items
APPLICATOR SURGIFLO ENDO (HEMOSTASIS) IMPLANT
BAG LAPAROSCOPIC 12 15 PORT 16 (BASKET) IMPLANT
BAG RETRIEVAL 12/15 (BASKET)
COVER BACK TABLE 60X90IN (DRAPES) ×3 IMPLANT
COVER TIP SHEARS 8 DVNC (MISCELLANEOUS) ×2 IMPLANT
COVER TIP SHEARS 8MM DA VINCI (MISCELLANEOUS) ×1
COVER WAND RF STERILE (DRAPES) IMPLANT
DECANTER SPIKE VIAL GLASS SM (MISCELLANEOUS) ×6 IMPLANT
DERMABOND ADVANCED (GAUZE/BANDAGES/DRESSINGS) ×1
DERMABOND ADVANCED .7 DNX12 (GAUZE/BANDAGES/DRESSINGS) ×2 IMPLANT
DRAPE ARM DVNC X/XI (DISPOSABLE) ×8 IMPLANT
DRAPE COLUMN DVNC XI (DISPOSABLE) ×2 IMPLANT
DRAPE DA VINCI XI ARM (DISPOSABLE) ×4
DRAPE DA VINCI XI COLUMN (DISPOSABLE) ×1
DRAPE SHEET LG 3/4 BI-LAMINATE (DRAPES) ×3 IMPLANT
DRAPE SURG IRRIG POUCH 19X23 (DRAPES) ×3 IMPLANT
ELECT REM PT RETURN 15FT ADLT (MISCELLANEOUS) ×3 IMPLANT
GLOVE BIO SURGEON STRL SZ 6 (GLOVE) ×12 IMPLANT
GLOVE BIO SURGEON STRL SZ 6.5 (GLOVE) ×6 IMPLANT
GOWN STRL REUS W/ TWL LRG LVL3 (GOWN DISPOSABLE) ×4 IMPLANT
GOWN STRL REUS W/TWL LRG LVL3 (GOWN DISPOSABLE) ×2
HOLDER FOLEY CATH W/STRAP (MISCELLANEOUS) IMPLANT
IRRIG SUCT STRYKERFLOW 2 WTIP (MISCELLANEOUS) ×3
IRRIGATION SUCT STRKRFLW 2 WTP (MISCELLANEOUS) ×2 IMPLANT
KIT PROCEDURE DA VINCI SI (MISCELLANEOUS) ×1
KIT PROCEDURE DVNC SI (MISCELLANEOUS) ×2 IMPLANT
KIT TURNOVER KIT A (KITS) IMPLANT
MANIPULATOR UTERINE 4.5 ZUMI (MISCELLANEOUS) ×3 IMPLANT
NEEDLE HYPO 22GX1.5 SAFETY (NEEDLE) ×3 IMPLANT
NEEDLE SPNL 18GX3.5 QUINCKE PK (NEEDLE) ×3 IMPLANT
OBTURATOR OPTICAL STANDARD 8MM (TROCAR) ×1
OBTURATOR OPTICAL STND 8 DVNC (TROCAR) ×2
OBTURATOR OPTICALSTD 8 DVNC (TROCAR) ×2 IMPLANT
PACK ROBOT GYN CUSTOM WL (TRAY / TRAY PROCEDURE) ×3 IMPLANT
PAD POSITIONING PINK XL (MISCELLANEOUS) ×3 IMPLANT
PORT ACCESS TROCAR AIRSEAL 12 (TROCAR) IMPLANT
PORT ACCESS TROCAR AIRSEAL 5M (TROCAR)
POUCH SPECIMEN RETRIEVAL 10MM (ENDOMECHANICALS) IMPLANT
SEAL CANN UNIV 5-8 DVNC XI (MISCELLANEOUS) ×8 IMPLANT
SEAL XI 5MM-8MM UNIVERSAL (MISCELLANEOUS) ×4
SET TRI-LUMEN FLTR TB AIRSEAL (TUBING) IMPLANT
SET TUBE SMOKE EVAC HIGH FLOW (TUBING) ×3 IMPLANT
SURGIFLO W/THROMBIN 8M KIT (HEMOSTASIS) IMPLANT
SUT VIC AB 0 CT1 27 (SUTURE)
SUT VIC AB 0 CT1 27XBRD ANTBC (SUTURE) IMPLANT
SYR 10ML LL (SYRINGE) ×3 IMPLANT
TOWEL OR NON WOVEN STRL DISP B (DISPOSABLE) ×3 IMPLANT
TRAP SPECIMEN MUCOUS 40CC (MISCELLANEOUS) IMPLANT
TRAY FOLEY MTR SLVR 16FR STAT (SET/KITS/TRAYS/PACK) ×3 IMPLANT
TROCAR XCEL 12X100 BLDLESS (ENDOMECHANICALS) ×3 IMPLANT
UNDERPAD 30X30 (UNDERPADS AND DIAPERS) ×3 IMPLANT
WATER STERILE IRR 1000ML POUR (IV SOLUTION) ×3 IMPLANT

## 2019-02-25 NOTE — Interval H&P Note (Signed)
History and Physical Interval Note:  02/25/2019 7:10 AM  Judith Schroeder  has presented today for surgery, with the diagnosis of ENDOMETRIAL CANCER.  The various methods of treatment have been discussed with the patient and family. After consideration of risks, benefits and other options for treatment, the patient has consented to  Procedure(s): XI ROBOTIC ASSISTED TOTAL HYSTERECTOMY WITH BILATERAL SALPINGO OOPHORECTOMY (Bilateral) SENTINE LYMPH NODE BIOPSY (N/A) as a surgical intervention.  The patient's history has been reviewed, patient examined, no change in status, stable for surgery.  I have reviewed the patient's chart and labs.  Questions were answered to the patient's satisfaction.     Thereasa Solo

## 2019-02-25 NOTE — Anesthesia Postprocedure Evaluation (Signed)
Anesthesia Post Note  Patient: Judith Schroeder  Procedure(s) Performed: XI ROBOTIC ASSISTED TOTAL HYSTERECTOMY WITH BILATERAL SALPINGO OOPHORECTOMY (Bilateral ) SENTINE LYMPH NODE BIOPSY (N/A )     Patient location during evaluation: PACU Anesthesia Type: General Level of consciousness: awake and alert Pain management: pain level controlled Vital Signs Assessment: post-procedure vital signs reviewed and stable Respiratory status: spontaneous breathing, nonlabored ventilation, respiratory function stable and patient connected to nasal cannula oxygen Cardiovascular status: blood pressure returned to baseline and stable Postop Assessment: no apparent nausea or vomiting Anesthetic complications: no    Last Vitals:  Vitals:   02/25/19 1030 02/25/19 1034  BP: (!) 129/59 129/62  Pulse: 77   Resp: (!) 26   Temp: (!) 36.2 C   SpO2: 94%     Last Pain:  Vitals:   02/25/19 1030  TempSrc:   PainSc: 0-No pain                 Mosella Kasa

## 2019-02-25 NOTE — Anesthesia Procedure Notes (Signed)
Procedure Name: Intubation Date/Time: 02/25/2019 7:41 AM Performed by: Sharlette Dense, CRNA Patient Re-evaluated:Patient Re-evaluated prior to induction Oxygen Delivery Method: Circle system utilized Preoxygenation: Pre-oxygenation with 100% oxygen Induction Type: IV induction Ventilation: Mask ventilation without difficulty and Oral airway inserted - appropriate to patient size Laryngoscope Size: Sabra Heck and 2 Grade View: Grade I Tube type: Oral Tube size: 7.5 mm Number of attempts: 1 Airway Equipment and Method: Stylet Placement Confirmation: ETT inserted through vocal cords under direct vision,  positive ETCO2 and breath sounds checked- equal and bilateral Secured at: 20 cm Tube secured with: Tape Dental Injury: Teeth and Oropharynx as per pre-operative assessment

## 2019-02-25 NOTE — Op Note (Signed)
OPERATIVE NOTE 02/25/19  Surgeon: Donaciano Eva   Assistants: Dr Lahoma Crocker (an MD assistant was necessary for tissue manipulation, management of robotic instrumentation, retraction and positioning due to the complexity of the case and hospital policies).   Anesthesia: General endotracheal anesthesia  ASA Class: 3   Pre-operative Diagnosis: endometrial cancer grade 2  Post-operative Diagnosis: same,   Operation: Robotic-assisted laparoscopic total hysterectomy with bilateral salpingoophorectomy, SLN biopsy   Surgeon: Donaciano Eva  Assistant Surgeon: Lahoma Crocker MD  Anesthesia: GET  Urine Output: 200  Operative Findings:  : 8cm uterus, normal tubes and ovaries, bilateral mapping, no suspicious.  Estimated Blood Loss:  <25cc      Total IV Fluids: 600 ml         Specimens: uterus, cervix, bilateral tubes and ovaries, right internal iliac SLN, left common iliac SLN, left proximal common iliac SLN.         Complications:  None; patient tolerated the procedure well.         Disposition: PACU - hemodynamically stable.  Procedure Details  The patient was seen in the Holding Room. The risks, benefits, complications, treatment options, and expected outcomes were discussed with the patient.  The patient concurred with the proposed plan, giving informed consent.  The site of surgery properly noted/marked. The patient was identified as Judith Schroeder and the procedure verified as a Robotic-assisted hysterectomy with bilateral salpingo oophorectomy with SLN biopsy. A Time Out was held and the above information confirmed.  After induction of anesthesia, the patient was draped and prepped in the usual sterile manner. Pt was placed in supine position after anesthesia and draped and prepped in the usual sterile manner. The abdominal drape was placed after the CholoraPrep had been allowed to dry for 3 minutes.  Her arms were tucked to her side with all appropriate  precautions.  The shoulders were stabilized with padded shoulder blocks applied to the acromium processes.  The patient was placed in the semi-lithotomy position in [place].  The perineum was prepped with Betadine. The patient was then prepped. Foley catheter was placed.  A sterile speculum was placed in the vagina.  The cervix was grasped with a single-tooth tenaculum. 2mg  total of ICG was injected into the cervical stroma at 2 and 9 o'clock with 1cc injected at a 1cm and 102mm depth (concentration 0.5mg /ml) in all locations. The cervix was dilated with Kennon Rounds dilators.  The ZUMI uterine manipulator with a medium colpotomizer ring was placed without difficulty.  A pneum occluder balloon was placed over the manipulator.  OG tube placement was confirmed and to suction.   Next, a 5 mm skin incision was made 1 cm below the subcostal margin in the midclavicular line.  The 5 mm Optiview port and scope was used for direct entry.  Opening pressure was under 10 mm CO2.  The abdomen was insufflated and the findings were noted as above.   At this point and all points during the procedure, the patient's intra-abdominal pressure did not exceed 15 mmHg. Next, a 10 mm skin incision was made in the umbilicus and a right and left port was placed about 10 cm lateral to the robot port on the right and left side.  A fourth arm was placed in the left lower quadrant 2 cm above and superior and medial to the anterior superior iliac spine.  All ports were placed under direct visualization.  The patient was placed in steep Trendelenburg.  Bowel was folded away into  the upper abdomen.  The robot was docked in the normal manner.  The right and left peritoneum were opened parallel to the IP ligament to open the retroperitoneal spaces bilaterally. The SLN mapping was performed in bilateral pelvic basins. The para rectal and paravesical spaces were opened up entirely with careful dissection below the level of the ureters bilaterally and  to the depth of the uterine artery origin in order to skeletonize the uterine "web" and ensure visualization of all parametrial channels. The para-aortic basins were carefully exposed and evaluated for isolated para-aortic SLN's. Lymphatic channels were identified travelling to the following visualized sentinel lymph node's: right internal iliac SLN, left common iliac and proximal iliac SLN. These SLN's were separated from their surrounding lymphatic tissue, removed and sent for permanent pathology.  The hysterectomy was started after the round ligament on the right side was incised and the retroperitoneum was entered and the pararectal space was developed.  The ureter was noted to be on the medial leaf of the broad ligament.  The peritoneum above the ureter was incised and stretched and the infundibulopelvic ligament was skeletonized, cauterized and cut.  The posterior peritoneum was taken down to the level of the KOH ring.  The anterior peritoneum was also taken down.  The bladder flap was created to the level of the KOH ring.  The uterine artery on the right side was skeletonized, cauterized and cut in the normal manner.  A similar procedure was performed on the left.  The colpotomy was made and the uterus, cervix, bilateral ovaries and tubes were amputated and delivered through the vagina.  Pedicles were inspected and excellent hemostasis was achieved.    The colpotomy at the vaginal cuff was closed with Vicryl on a CT1 needle in a running manner.  Irrigation was used and excellent hemostasis was achieved.  At this point in the procedure was completed.  Robotic instruments were removed under direct visulaization.  The robot was undocked. The 10 mm ports were closed with Vicryl on a UR-5 needle and the fascia was closed with 0 Vicryl on a UR-5 needle.  The skin was closed with 4-0 Vicryl in a subcuticular manner.  Dermabond was applied.  Sponge, lap and needle counts correct x 2.  The patient was taken to the  recovery room in stable condition.  The vagina was swabbed with  minimal bleeding noted.   All instrument and needle counts were correct x  3.   The patient was transferred to the recovery room in a stable condition.  Donaciano Eva, MD

## 2019-02-25 NOTE — Transfer of Care (Signed)
Immediate Anesthesia Transfer of Care Note  Patient: Judith Schroeder  Procedure(s) Performed: XI ROBOTIC ASSISTED TOTAL HYSTERECTOMY WITH BILATERAL SALPINGO OOPHORECTOMY (Bilateral ) SENTINE LYMPH NODE BIOPSY (N/A )  Patient Location: PACU  Anesthesia Type:General  Level of Consciousness: awake and alert   Airway & Oxygen Therapy: Patient Spontanous Breathing and Patient connected to face mask oxygen  Post-op Assessment: Report given to RN and Post -op Vital signs reviewed and stable  Post vital signs: Reviewed and stable  Last Vitals:  Vitals Value Taken Time  BP 172/70 02/25/2019 10:07 AM  Temp    Pulse 67 02/25/2019 10:08 AM  Resp 15 02/25/2019 10:08 AM  SpO2 100 % 02/25/2019 10:08 AM  Vitals shown include unvalidated device data.  Last Pain:  Vitals:   02/25/19 0609  TempSrc:   PainSc: 0-No pain         Complications: No apparent anesthesia complications

## 2019-02-25 NOTE — Progress Notes (Signed)
Hydralazine 10mg  given for elevated bp, per Dr Ermalene Postin

## 2019-02-25 NOTE — Discharge Instructions (Addendum)
Activity: 1. Be up and out of the bed during the day.  Take a nap if needed.  You may walk up steps but be careful and use the hand rail.  Stair climbing will tire you more than you think, you may need to stop part way and rest.   2. No lifting or straining for 4 weeks.  3. No driving for 1 weeks.  Do Not drive if you are taking narcotic pain medicine.  4. Shower daily.  Use soap and water on your incision and pat dry; don't rub.   5. No sexual activity and nothing in the vagina for 8 weeks.  Medications:  - Take ibuprofen and tylenol first line for pain control. Take these regularly (every 6 hours) to decrease the build up of pain.  - If necessary, for severe pain not relieved by ibuprofen, contact Dr Serita Grit office and you will be prescribed percocet.  - While taking percocet you should take sennakot every night to reduce the likelihood of constipation. If this causes diarrhea, stop its use.  Diet: 1. Low sodium Heart Healthy Diet is recommended.  2. It is safe to use a laxative if you have difficulty moving your bowels.   Wound Care: 1. Keep clean and dry.  Shower daily.  Reasons to call the Doctor:   Fever - Oral temperature greater than 100.4 degrees Fahrenheit  Foul-smelling vaginal discharge  Difficulty urinating  Nausea and vomiting  Increased pain at the site of the incision that is unrelieved with pain medicine.  Difficulty breathing with or without chest pain  New calf pain especially if only on one side  Sudden, continuing increased vaginal bleeding with or without clots.   Follow-up: 1. See Everitt Amber in 4 weeks.  Contacts: For questions or concerns you should contact:  Dr. Everitt Amber at [phone] After hours and on week-ends call [phone] and ask to speak to the physician on call for Gynecologic Oncology     Total Laparoscopic Hysterectomy A total laparoscopic hysterectomy is a minimally invasive surgery to remove the uterus and  cervix. The fallopian tubes and ovaries can also be removed (bilateral salpingo-oophorectomy) during this surgery, if necessary. This procedure may be done to treat problems such as:  Noncancerous growths in the uterus (uterine fibroids) that cause symptoms.  A condition that causes the lining of the uterus (endometrium) to grow in other areas (endometriosis).  Problems with pelvic support. This is caused by weakened muscles of the pelvis following vaginal childbirth or menopause.  Cancer of the cervix, ovaries, uterus, or endometrium.  Excessive (dysfunctional) uterine bleeding. This surgery is performed by inserting a thin, lighted tube (laparoscope) and surgical instruments into small incisions in the abdomen. The laparoscope sends images to a monitor. The images help the health care provider perform the procedure. After this procedure, you will no longer be able to have a baby, and you will no longer have a menstrual period. Tell a health care provider about:  Any allergies you have.  All medicines you are taking, including vitamins, herbs, eye drops, creams, and over-the-counter medicines.  Any problems you or family members have had with anesthetic medicines.  Any blood disorders you have.  Any surgeries you have had.  Any medical conditions you have.  Whether you are pregnant or may be pregnant. What are the risks? Generally, this is a safe procedure. However, problems may occur, including:  Infection.  Bleeding.  Blood clots in the legs or lungs.  Allergic  reactions to medicines.  Damage to other structures or organs.  The risk that the surgery may have to be switched to the regular one in which a large incision is made in the abdomen (abdominal hysterectomy). What happens before the procedure? Staying hydrated Follow instructions from your health care provider about hydration, which may include:  Up to 2 hours before the procedure - you may continue to drink  clear liquids, such as water, clear fruit juice, black coffee, and plain tea Eating and drinking restrictions Follow instructions from your health care provider about eating and drinking, which may include:  8 hours before the procedure - stop eating heavy meals or foods such as meat, fried foods, or fatty foods.  6 hours before the procedure - stop eating light meals or foods, such as toast or cereal.  6 hours before the procedure - stop drinking milk or drinks that contain milk.  2 hours before the procedure - stop drinking clear liquids. Medicines  Ask your health care provider about: ? Changing or stopping your regular medicines. This is especially important if you are taking diabetes medicines or blood thinners. ? Taking over-the-counter medicines, vitamins, herbs, and supplements. ? Taking medicines such as aspirin and ibuprofen. These medicines can thin your blood. Do not take these medicines unless your health care provider tells you to take them.  You may be given antibiotic medicine to help prevent infection.  You may be asked to take laxatives.  You may be given medicines to help prevent nausea and vomiting after the procedure. General instructions  Ask your health care provider how your surgical site will be marked or identified.  You may be asked to shower with a germ-killing soap.  Do not use any products that contain nicotine or tobacco, such as cigarettes and e-cigarettes. If you need help quitting, ask your health care provider.  You may have an exam or testing, such as an ultrasound to determine the size and shape of your pelvic organs.  You may have a blood or urine sample taken.  This procedure can affect the way you feel about yourself. Talk with your health care provider about the physical and emotional changes hysterectomy may cause.  Plan to have someone take you home from the hospital or clinic.  Plan to have a responsible adult care for you for at  least 24 hours after you leave the hospital or clinic. This is important. What happens during the procedure?  To lower your risk of infection: ? Your health care team will wash or sanitize their hands. ? Your skin will be washed with soap. ? Hair may be removed from the surgical area.  An IV will be inserted into one of your veins.  You will be given one or more of the following: ? A medicine to help you relax (sedative). ? A medicine to make you fall asleep (general anesthetic).  You will be given antibiotic medicine through your IV.  A tube may be inserted down your throat to help you breathe during the procedure.  A gas (carbon dioxide) will be used to inflate your abdomen to allow your surgeon to see inside of your abdomen.  Three or four small incisions will be made in your abdomen.  A laparoscope will be inserted into one of your incisions. Surgical instruments will be inserted through the other incisions in order to perform the procedure.  Your uterus and cervix may be removed through your vagina or cut into small pieces and  removed through the small incisions. Any other organs that need to be removed will also be removed this way.  Carbon dioxide will be released from inside of your abdomen.  Your incisions will be closed with stitches (sutures).  A bandage (dressing) may be placed over your incisions. The procedure may vary among health care providers and hospitals. What happens after the procedure?  Your blood pressure, heart rate, breathing rate, and blood oxygen level will be monitored until the medicines you were given have worn off.  You will be given medicine for pain and nausea as needed.  Do not drive for 24 hours if you received a sedative. Summary  Total Laparoscopic hysterectomy is a procedure to remove your uterus, cervix and sometimes the fallopian tubes and ovaries.  This procedure can affect the way you feel about yourself. Talk with your health care  provider about the physical and emotional changes hysterectomy may cause.  After this procedure, you will no longer be able to have a baby, and you will no longer have a menstrual period.  You will be given pain medicine to control discomfort after this procedure. This information is not intended to replace advice given to you by your health care provider. Make sure you discuss any questions you have with your health care provider. Document Released: 09/29/2007 Document Revised: 02/12/2017 Document Reviewed: 02/12/2017 Elsevier Interactive Patient Education  2019 [employer].

## 2019-02-26 ENCOUNTER — Encounter (HOSPITAL_COMMUNITY): Payer: Self-pay | Admitting: Gynecologic Oncology

## 2019-02-26 ENCOUNTER — Telehealth: Payer: Self-pay

## 2019-02-26 NOTE — Telephone Encounter (Addendum)
Outgoing call to patient's daughter per Joylene John NP - how is she doing ? (patient) Per Daughter:  "Doing Fantastic" She slept well, took shower, then rested Eating well, voided, had bm Has not had to take any pain medication  Discussed basic incision care, can pad dry lightly after shower, no lotions in that area -could disrupt glue- pt voiced understanding.   Reminded of upcoming f/u with Dr Denman George.  Encouraged to call if fever, concerns with incision, or pain or any other concerns.  Daughter voiced understanding. No other needs per her at this time.

## 2019-03-02 ENCOUNTER — Telehealth: Payer: Self-pay | Admitting: Gynecologic Oncology

## 2019-03-02 ENCOUNTER — Encounter: Payer: Self-pay | Admitting: Oncology

## 2019-03-02 DIAGNOSIS — L03116 Cellulitis of left lower limb: Secondary | ICD-10-CM | POA: Diagnosis not present

## 2019-03-02 DIAGNOSIS — Z4800 Encounter for change or removal of nonsurgical wound dressing: Secondary | ICD-10-CM | POA: Diagnosis not present

## 2019-03-02 DIAGNOSIS — C541 Malignant neoplasm of endometrium: Secondary | ICD-10-CM

## 2019-03-02 DIAGNOSIS — S81802D Unspecified open wound, left lower leg, subsequent encounter: Secondary | ICD-10-CM | POA: Diagnosis not present

## 2019-03-02 DIAGNOSIS — Z7982 Long term (current) use of aspirin: Secondary | ICD-10-CM | POA: Diagnosis not present

## 2019-03-02 DIAGNOSIS — I10 Essential (primary) hypertension: Secondary | ICD-10-CM | POA: Diagnosis not present

## 2019-03-02 DIAGNOSIS — F0390 Unspecified dementia without behavioral disturbance: Secondary | ICD-10-CM | POA: Diagnosis not present

## 2019-03-02 NOTE — Telephone Encounter (Signed)
Spoke with the patient's daughter.  She states her mother is doing well. Informed of final path results along with Dr. Serita Grit recommendations for vaginal brachytherapy.  All questions answered.  Advised to call the office for any needs or concerns and to follow up as scheduled or sooner if needed.

## 2019-03-03 ENCOUNTER — Encounter: Payer: Self-pay | Admitting: Gynecologic Oncology

## 2019-03-03 DIAGNOSIS — F0390 Unspecified dementia without behavioral disturbance: Secondary | ICD-10-CM | POA: Diagnosis not present

## 2019-03-03 DIAGNOSIS — L03116 Cellulitis of left lower limb: Secondary | ICD-10-CM | POA: Diagnosis not present

## 2019-03-03 DIAGNOSIS — Z7982 Long term (current) use of aspirin: Secondary | ICD-10-CM | POA: Diagnosis not present

## 2019-03-03 DIAGNOSIS — I10 Essential (primary) hypertension: Secondary | ICD-10-CM | POA: Diagnosis not present

## 2019-03-03 DIAGNOSIS — Z4800 Encounter for change or removal of nonsurgical wound dressing: Secondary | ICD-10-CM | POA: Diagnosis not present

## 2019-03-03 DIAGNOSIS — S81802D Unspecified open wound, left lower leg, subsequent encounter: Secondary | ICD-10-CM | POA: Diagnosis not present

## 2019-03-09 DIAGNOSIS — L03116 Cellulitis of left lower limb: Secondary | ICD-10-CM | POA: Diagnosis not present

## 2019-03-09 DIAGNOSIS — I10 Essential (primary) hypertension: Secondary | ICD-10-CM | POA: Diagnosis not present

## 2019-03-09 DIAGNOSIS — S81802D Unspecified open wound, left lower leg, subsequent encounter: Secondary | ICD-10-CM | POA: Diagnosis not present

## 2019-03-09 DIAGNOSIS — F0390 Unspecified dementia without behavioral disturbance: Secondary | ICD-10-CM | POA: Diagnosis not present

## 2019-03-09 DIAGNOSIS — Z4800 Encounter for change or removal of nonsurgical wound dressing: Secondary | ICD-10-CM | POA: Diagnosis not present

## 2019-03-09 DIAGNOSIS — Z7982 Long term (current) use of aspirin: Secondary | ICD-10-CM | POA: Diagnosis not present

## 2019-03-10 ENCOUNTER — Telehealth: Payer: Self-pay | Admitting: *Deleted

## 2019-03-10 NOTE — Telephone Encounter (Signed)
Called and spoke with the patient's daughter regarding her appt on 4/3. Explained that the hospital is asking during this COVID-19 issue to combine clinic days and reschedule routine appts. Appt rescheduled to 4/1, patient is a post op appt. Explained that we will call her the day before to pre-screen her and the patient, also that we will notify the front desk that the daughter will come with the patient, patient has dementia.

## 2019-03-16 ENCOUNTER — Telehealth: Payer: Self-pay | Admitting: *Deleted

## 2019-03-16 NOTE — Telephone Encounter (Signed)
Called and spoke with the patient's daughter regarding the appt for tomorrow. Both the patient and daughter has no signs/symptoms, traveled or had any exposure to COVID-19. They both have not been around sick or that has had an exposure. Explained that they will be asked the questions again tomorrow; along with temperature check.

## 2019-03-16 NOTE — Progress Notes (Signed)
Follow-up Note: Gyn-Onc  Consult was initially requested by Dr. Glo Herring for the evaluation of Judith Schroeder 73 y.o. female  CC:  Chief Complaint  Patient presents with  . Endometrial adenocarcinoma [name])    Assessment/Plan:  [name]  is a 73 y.o.  year old with dementia and a stage 1B grade 2 endometrial cancer.  High/intermediate risk factors for recurrence. Recommendation is for vaginal brachytherapy to reduce risk for local recurrence in accordance with NCCN guidelines.  I discussed this with the patient. I discussed the role of adjuvant therapy. I discussed prognosis and risk for recurrence. We reviewed symptoms concerning for recurrence and she will see me if these develop prior to her scheduled appointment.  After completing adjuvant therapy I recommend she follow-up at 3 monthly intervals for symptom review, physical examination and pelvic examination. Pap smear is not recommended in routine endometrial cancer surveillance. After 2 years we will space these visits to every 6 months, and then annually if recurrence has not developed within 5 years. All questions were answered.  HPI: 73year old woman who is seen in consultation at the request of Dr Glo Herring for a grade 2 endometrial cancer.  The patient has alzheimers dementia and a history of an aortic valve replacement. She reported vaginal bleeding to her daughter in January. This persisted and so she was seen by Dr Glo Herring on 01/26/19 who performed a TVUS which showed a thickened endometrium.  She then underwent a biopsy the same day. This showed grade 2 endometrioid endometrial cancer.   The patient has progressive dementia but is conversant and cooperative and able to give some history herself. She ambulates independently and is independent with ADL's.  She has reported increasing SOB on exertion recently.   She has a history of an aortic valve replacement and was on anticoagulant therapy initially but is only  taking ASA now. Her cardiologist is Dr [place] at [facility].    Interval Hx:  On February 25, 2019 she underwent a robotic assisted total laparoscopic hysterectomy with bilateral salpingo-oophorectomy and sentinel lymph node biopsy.  Intraoperative findings were unremarkable.. Final pathology showed a FIGO grade 2 endometrioid adenocarcinoma measuring 4.5 cm.  Focal deep myometrial invasion was noted with 8 mm of 11 mm total thickness.  No lymphovascular space invasion was seen.  The cervix and adnexa and sentinel lymph nodes were all negative. She was determined to have high/intermediate risk endometrial cancer and was recommended vaginal brachytherapy in accordance with NCCN guidelines.   Current Meds:  Outpatient Encounter Medications as of 03/17/2019  Medication Sig  . aspirin EC 81 MG tablet Take 81 mg by mouth daily.   . cholecalciferol (VITAMIN D3) 25 MCG (1000 UT) tablet Take 1,000 Units by mouth every morning.  . donepezil (ARICEPT) 10 MG tablet Take 1 tablet (10 mg total) by mouth at bedtime.  Marland Kitchen eplerenone (INSPRA) 25 MG tablet Take 25 mg by mouth every morning.   . furosemide (LASIX) 20 MG tablet Take 20 mg by mouth daily.  Marland Kitchen LORazepam (ATIVAN) 0.5 MG tablet 1 tab every 8 hours as needed for agitation/anxiety that cannot be redirected. Watch for sedation and risk of falls. (Patient taking differently: Take 0.5 mg by mouth every 8 (eight) hours as needed for anxiety. May take 1 tab every 8 hours as needed for agitation/anxiety that cannot be redirected. Watch for sedation and risk of falls.)  . magnesium oxide (MAG-OX) 400 MG tablet Take 400 mg by mouth at bedtime.  . memantine (  NAMENDA) 10 MG tablet TAKE (1) TABLET BY MOUTH TWICE DAILY. (Patient taking differently: Take 10 mg by mouth 2 (two) times daily. )  . metoprolol succinate (TOPROL-XL) 25 MG 24 hr tablet Take 25 mg by mouth every morning.   . prednisoLONE acetate (PRED FORTE) 1 % ophthalmic suspension Place 1 drop into the left eye daily.   .  rosuvastatin (CRESTOR) 5 MG tablet Take 5 mg by mouth every morning.   . senna-docusate (SENOKOT-S) 8.6-50 MG tablet Take 2 tablets by mouth at bedtime.  . sertraline (ZOLOFT) 25 MG tablet Take 25 mg by mouth every morning.   . valACYclovir (VALTREX) 1000 MG tablet Take 1 g by mouth every morning.  . [DISCONTINUED] traMADol (ULTRAM) 50 MG tablet Take 1 tablet (50 mg total) by mouth every 6 (six) hours as needed for severe pain. For AFTER surgery, do not take and drive   No facility-administered encounter medications on file as of 03/17/2019.     Allergy:  Allergies  Allergen Reactions  . Atorvastatin Other (See Comments)    Muscle pain    Social Hx:   Social History   Socioeconomic History  . Marital status: Widowed    Spouse name: Not on file  . Number of children: 1  . Years of education: 12+  . Highest education level: Not on file  Occupational History  . Occupation: Retired  Scientific laboratory technician  . Financial resource strain: Not on file  . Food insecurity:    Worry: Not on file    Inability: Not on file  . Transportation needs:    Medical: Not on file    Non-medical: Not on file  Tobacco Use  . Smoking status: Never Smoker  . Smokeless tobacco: Never Used  Substance and Sexual Activity  . Alcohol use: No    Alcohol/week: 0.0 standard drinks  . Drug use: No  . Sexual activity: Not Currently  Lifestyle  . Physical activity:    Days per week: Not on file    Minutes per session: Not on file  . Stress: Not on file  Relationships  . Social connections:    Talks on phone: Not on file    Gets together: Not on file    Attends religious service: Not on file    Active member of club or organization: Not on file    Attends meetings of clubs or organizations: Not on file    Relationship status: Not on file  . Intimate partner violence:    Fear of current or ex partner: Not on file    Emotionally abused: Not on file    Physically abused: Not on file    Forced sexual activity:  Not on file  Other Topics Concern  . Not on file  Social History Narrative   Patient lives at home alone.    Patient is widowed.    Patient has 1 child   Patient has 2 years of college   Patient is right handed    Caffeine use: 2-3 glasses diet green tea per day   No soda     Past Surgical Hx:  Past Surgical History:  Procedure Laterality Date  . AORTIC VALVE REPLACEMENT  2012  . CARDIAC VALVE REPLACEMENT     aortic  . DILATION AND CURETTAGE OF UTERUS    . EMPYEMA DRAINAGE  2017  . LYMPH NODE BIOPSY N/A 02/25/2019   Procedure: Clement Husbands LYMPH NODE BIOPSY;  Surgeon: Everitt Amber, MD;  Location: [facility];  Service:  Gynecology;  Laterality: N/A;  . ROBOTIC ASSISTED TOTAL HYSTERECTOMY WITH BILATERAL SALPINGO OOPHERECTOMY Bilateral 02/25/2019   Procedure: XI ROBOTIC ASSISTED TOTAL HYSTERECTOMY WITH BILATERAL SALPINGO OOPHORECTOMY;  Surgeon: Everitt Amber, MD;  Location: [facility];  Service: Gynecology;  Laterality: Bilateral;  . TUBAL LIGATION    . VIDEO ASSISTED THORACOSCOPY (VATS)/EMPYEMA Right 09/07/2016   Procedure: VIDEO ASSISTED THORACOSCOPY (VATS)/EMPYEMA;  Surgeon: Gaye Pollack, MD;  Location: [facility] OR;  Service: Thoracic;  Laterality: Right;    Past Medical Hx:  Past Medical History:  Diagnosis Date  . Cancer [facility])    endometrial cancer  . Carpal tunnel syndrome   . Dementia ([place])   . Dementia ([place]) 2018  . Depression   . H/O aortic valve replacement   . High blood pressure   . High cholesterol   . Pleural effusion   . Shingles 2019   L eye    Past Gynecological History:  SVD x 1 No LMP recorded. Patient is postmenopausal.  Family Hx:  Family History  Problem Relation Age of Onset  . Hypertension Mother   . Cancer Father        head and neck   . Dementia Sister     Review of Systems:  Constitutional  Feels well,    ENT Normal appearing ears and nares bilaterally Skin/Breast  No rash, sores, jaundice, itching, dryness Cardiovascular  No chest pain, shortness of  breath, or edema  Pulmonary  No cough or wheeze.  Gastro Intestinal  No nausea, vomitting, or diarrhoea. No bright red blood per rectum, no abdominal pain, change in bowel movement, or constipation.  Genito Urinary  No frequency, urgency, dysuria, no bleeding Musculo Skeletal  No myalgia, arthralgia, joint swelling or pain  Neurologic  No weakness, numbness, change in gait,  Psychology  No depression, anxiety, insomnia. + dementia  Vitals:  Blood pressure (!) 170/70, pulse 76, temperature 98.3 F (36.8 C), temperature source Oral, resp. rate 20, weight 226 lb (102.5 kg), SpO2 99 %.  Physical Exam: WD in NAD Neck  Supple NROM, without any enlargements.  Lymph Node Survey No cervical supraclavicular or inguinal adenopathy Cardiovascular  Pulse normal rate, regularity and rhythm. S1 and S2 normal.  Lungs  Clear to auscultation bilateraly, without wheezes/crackles/rhonchi. Good air movement.  Skin  No rash/lesions/breakdown  Psychiatry  Alert and oriented to person, place, and time  Abdomen  Normoactive bowel sounds, abdomen soft, non-tender and obese without evidence of hernia. Incisions well healed. Back No CVA tenderness Genito Urinary  Vaginal cuff healing normally Rectal  deferred Extremities  No bilateral cyanosis, clubbing or edema.   30 minutes of direct face to face counseling time was spent with the patient. This included discussion about prognosis, therapy recommendations and postoperative side effects and are beyond the scope of routine postoperative care.  Thereasa Solo, MD  03/17/2019, 4:01 PM

## 2019-03-16 NOTE — Telephone Encounter (Signed)
Per leadership, called and explained to the daughter that we can no longer let her accompany her mother to the appt tomorrow. Explained that we will meet the patient at the front desk and stay with her the length of her appt. Explained that we will call her with a phone so that she can listen to the appt and ask questions.

## 2019-03-17 ENCOUNTER — Inpatient Hospital Stay: Payer: Medicare Other | Attending: Gynecologic Oncology | Admitting: Gynecologic Oncology

## 2019-03-17 ENCOUNTER — Encounter: Payer: Self-pay | Admitting: Gynecologic Oncology

## 2019-03-17 ENCOUNTER — Other Ambulatory Visit: Payer: Self-pay

## 2019-03-17 VITALS — BP 170/70 | HR 76 | Temp 98.3°F | Resp 20 | Wt 226.0 lb

## 2019-03-17 DIAGNOSIS — Z90710 Acquired absence of both cervix and uterus: Secondary | ICD-10-CM | POA: Diagnosis not present

## 2019-03-17 DIAGNOSIS — Z7982 Long term (current) use of aspirin: Secondary | ICD-10-CM | POA: Diagnosis not present

## 2019-03-17 DIAGNOSIS — Z4800 Encounter for change or removal of nonsurgical wound dressing: Secondary | ICD-10-CM | POA: Diagnosis not present

## 2019-03-17 DIAGNOSIS — Z7189 Other specified counseling: Secondary | ICD-10-CM

## 2019-03-17 DIAGNOSIS — C541 Malignant neoplasm of endometrium: Secondary | ICD-10-CM

## 2019-03-17 DIAGNOSIS — F0390 Unspecified dementia without behavioral disturbance: Secondary | ICD-10-CM | POA: Diagnosis not present

## 2019-03-17 DIAGNOSIS — Z90722 Acquired absence of ovaries, bilateral: Secondary | ICD-10-CM

## 2019-03-17 DIAGNOSIS — L03116 Cellulitis of left lower limb: Secondary | ICD-10-CM | POA: Diagnosis not present

## 2019-03-17 DIAGNOSIS — S81802D Unspecified open wound, left lower leg, subsequent encounter: Secondary | ICD-10-CM | POA: Diagnosis not present

## 2019-03-17 DIAGNOSIS — I10 Essential (primary) hypertension: Secondary | ICD-10-CM | POA: Diagnosis not present

## 2019-03-17 NOTE — Patient Instructions (Signed)
Dr Denman George is recommending radiation therapy to the vagina. You have an appointment to discuss this further on 04/12/19.  Dr Denman George will attempt to allow for your daughter to be present.  Dr Denman George will see you back after completing radiation therapy.  Dr Denman George can be reached at [phone].

## 2019-03-19 ENCOUNTER — Ambulatory Visit: Payer: Self-pay | Admitting: Gynecologic Oncology

## 2019-03-22 DIAGNOSIS — S81802D Unspecified open wound, left lower leg, subsequent encounter: Secondary | ICD-10-CM | POA: Diagnosis not present

## 2019-03-22 DIAGNOSIS — F0390 Unspecified dementia without behavioral disturbance: Secondary | ICD-10-CM | POA: Diagnosis not present

## 2019-03-22 DIAGNOSIS — I10 Essential (primary) hypertension: Secondary | ICD-10-CM | POA: Diagnosis not present

## 2019-03-22 DIAGNOSIS — Z4800 Encounter for change or removal of nonsurgical wound dressing: Secondary | ICD-10-CM | POA: Diagnosis not present

## 2019-03-22 DIAGNOSIS — Z7982 Long term (current) use of aspirin: Secondary | ICD-10-CM | POA: Diagnosis not present

## 2019-03-22 DIAGNOSIS — L03116 Cellulitis of left lower limb: Secondary | ICD-10-CM | POA: Diagnosis not present

## 2019-03-23 DIAGNOSIS — S81802D Unspecified open wound, left lower leg, subsequent encounter: Secondary | ICD-10-CM | POA: Diagnosis not present

## 2019-03-23 DIAGNOSIS — I10 Essential (primary) hypertension: Secondary | ICD-10-CM | POA: Diagnosis not present

## 2019-03-23 DIAGNOSIS — L03116 Cellulitis of left lower limb: Secondary | ICD-10-CM | POA: Diagnosis not present

## 2019-03-23 DIAGNOSIS — Z7982 Long term (current) use of aspirin: Secondary | ICD-10-CM | POA: Diagnosis not present

## 2019-03-23 DIAGNOSIS — F0390 Unspecified dementia without behavioral disturbance: Secondary | ICD-10-CM | POA: Diagnosis not present

## 2019-03-23 DIAGNOSIS — Z4800 Encounter for change or removal of nonsurgical wound dressing: Secondary | ICD-10-CM | POA: Diagnosis not present

## 2019-03-29 DIAGNOSIS — Z7982 Long term (current) use of aspirin: Secondary | ICD-10-CM | POA: Diagnosis not present

## 2019-03-29 DIAGNOSIS — F0390 Unspecified dementia without behavioral disturbance: Secondary | ICD-10-CM | POA: Diagnosis not present

## 2019-03-29 DIAGNOSIS — Z4800 Encounter for change or removal of nonsurgical wound dressing: Secondary | ICD-10-CM | POA: Diagnosis not present

## 2019-03-29 DIAGNOSIS — I10 Essential (primary) hypertension: Secondary | ICD-10-CM | POA: Diagnosis not present

## 2019-03-29 DIAGNOSIS — S81802D Unspecified open wound, left lower leg, subsequent encounter: Secondary | ICD-10-CM | POA: Diagnosis not present

## 2019-03-29 DIAGNOSIS — L03116 Cellulitis of left lower limb: Secondary | ICD-10-CM | POA: Diagnosis not present

## 2019-04-06 DIAGNOSIS — L03116 Cellulitis of left lower limb: Secondary | ICD-10-CM | POA: Diagnosis not present

## 2019-04-06 DIAGNOSIS — I10 Essential (primary) hypertension: Secondary | ICD-10-CM | POA: Diagnosis not present

## 2019-04-06 DIAGNOSIS — Z7982 Long term (current) use of aspirin: Secondary | ICD-10-CM | POA: Diagnosis not present

## 2019-04-06 DIAGNOSIS — Z4800 Encounter for change or removal of nonsurgical wound dressing: Secondary | ICD-10-CM | POA: Diagnosis not present

## 2019-04-06 DIAGNOSIS — F0390 Unspecified dementia without behavioral disturbance: Secondary | ICD-10-CM | POA: Diagnosis not present

## 2019-04-06 DIAGNOSIS — S81802D Unspecified open wound, left lower leg, subsequent encounter: Secondary | ICD-10-CM | POA: Diagnosis not present

## 2019-04-08 ENCOUNTER — Telehealth: Payer: Self-pay | Admitting: Oncology

## 2019-04-08 NOTE — Telephone Encounter (Signed)
Called patient's daughter, [name] to discuss cancellation of appointment with Dr. Sondra Come.  She said they had a family meeting and decided that treatment may not be worth it at this time.  She said they made the decision due to the risk of possible exposure to Covid 19 and having to do the treatments by herself.  Advised her that I will notify Dr. Denman George and Joylene John, NP.

## 2019-04-08 NOTE — Telephone Encounter (Signed)
Called Judith Schroeder back and advised her that she can use Webex or phone/facetime during the appointment with Dr. Sondra Come and that I will meet Judith Schroeder at the door on Monday and stay with her during the appointment.  Also advised her that the treatments will be one a week for 5 weeks.  She said that would be good and would like to reschedule the appointment for Monday.

## 2019-04-12 ENCOUNTER — Ambulatory Visit: Payer: Medicare Other | Admitting: Radiation Oncology

## 2019-04-12 ENCOUNTER — Other Ambulatory Visit: Payer: Self-pay

## 2019-04-12 ENCOUNTER — Encounter: Payer: Self-pay | Admitting: Radiation Oncology

## 2019-04-12 ENCOUNTER — Ambulatory Visit
Admission: RE | Admit: 2019-04-12 | Discharge: 2019-04-12 | Disposition: A | Discharge status: Home / Self Care | Payer: Medicare Other | Source: Ambulatory Visit | Attending: Radiation Oncology | Admitting: Radiation Oncology

## 2019-04-12 ENCOUNTER — Encounter: Payer: Self-pay | Admitting: Oncology

## 2019-04-12 ENCOUNTER — Ambulatory Visit: Payer: Medicare Other

## 2019-04-12 VITALS — BP 151/70 | HR 75 | Temp 97.9°F | Resp 18 | Ht 63.0 in | Wt 230.2 lb

## 2019-04-12 DIAGNOSIS — C541 Malignant neoplasm of endometrium: Secondary | ICD-10-CM | POA: Diagnosis not present

## 2019-04-12 DIAGNOSIS — I10 Essential (primary) hypertension: Secondary | ICD-10-CM | POA: Insufficient documentation

## 2019-04-12 DIAGNOSIS — F0390 Unspecified dementia without behavioral disturbance: Secondary | ICD-10-CM | POA: Insufficient documentation

## 2019-04-12 DIAGNOSIS — Z90722 Acquired absence of ovaries, bilateral: Secondary | ICD-10-CM | POA: Diagnosis not present

## 2019-04-12 DIAGNOSIS — Z801 Family history of malignant neoplasm of trachea, bronchus and lung: Secondary | ICD-10-CM | POA: Diagnosis not present

## 2019-04-12 DIAGNOSIS — F329 Major depressive disorder, single episode, unspecified: Secondary | ICD-10-CM | POA: Insufficient documentation

## 2019-04-12 DIAGNOSIS — E7800 Pure hypercholesterolemia, unspecified: Secondary | ICD-10-CM | POA: Insufficient documentation

## 2019-04-12 DIAGNOSIS — Z7982 Long term (current) use of aspirin: Secondary | ICD-10-CM | POA: Diagnosis not present

## 2019-04-12 DIAGNOSIS — Z79899 Other long term (current) drug therapy: Secondary | ICD-10-CM | POA: Diagnosis not present

## 2019-04-12 DIAGNOSIS — J90 Pleural effusion, not elsewhere classified: Secondary | ICD-10-CM | POA: Diagnosis not present

## 2019-04-12 DIAGNOSIS — Z90710 Acquired absence of both cervix and uterus: Secondary | ICD-10-CM | POA: Insufficient documentation

## 2019-04-12 NOTE — Progress Notes (Signed)
Radiation Oncology         [phone] ________________________________  Initial Outpatient Consultation  Name: Judith Schroeder MRN: [id number]  Date: 04/12/2019  DOB: 04-04-1946  OV:FIEPP, Carloyn Manner, MD  [name], MD   REFERRING PHYSICIAN: [name], MD  DIAGNOSIS: The encounter diagnosis was Endometrial cancer [facility]). stage1B grade 2endometrial cancer  HISTORY OF PRESENT ILLNESS::Judith Schroeder is a 73 y.o. female who is presenting to the office today for evaluation of endometrial cancer. She reported vaginal bleeding to her daughter in January. This persisted and so she was seen by Dr. Glo Herring on 01/26/19 who performed a TVUS which showed a thickened endometrium.  She then underwent a biopsy the same day. This showed grade 2 endometrioid endometrial cancer. She then met with Dr. Denman George 2/21 who discussed surgical options with her and her daughter. They decided to proceed with robotic assisted total hysterectomy with bilateral sapingo-oophorectomy and SLN biopsy. She last met with Dr. Denman George on 03/17/19 who suggested vaginal brachytherapy to reduce risk of local recurrence. She was referred to the clinic today for consideration.   On 02/25/19 she underwent robotic-assisted laparoscopic total hysterectomy with bilateral salpingoophorectomy, SLN biopsy. Pathology from the biopsy performed during surgery revealed the following: 1. Lymph node, sentinel, biopsy, right internal iliac; three lymph nodes with endosalpingiosis. No metastatic carcinoma identified.  2. Lymph node, sentinel, biopsy, left common iliac; one lymph node with no metastatic carcinoma. 3. Lymph node, sentinel, biopsy, left proximal common iliac; one lymph node with no metastatic carcinoma.  4. Uterus +/- tubes/ovaries, neoplastic, cervix, bilateral fallopian tubes and ovaries; endometrium - endometrioid adenocarcinoma, 4.5 cm, FIGO grade II, focal deep myometrial invasion (margins not involved); cervix, bilateral ovaries and  bilateral fallopian tubes free of tumor; uterus - carcinoma or carcinosarcoma.    she denies pain and any other symptoms.    PREVIOUS RADIATION THERAPY: No  PAST MEDICAL HISTORY:  has a past medical history of Cancer [facility]), Carpal tunnel syndrome, Dementia ([place]), Dementia ([place]) (2018), Depression, H/O aortic valve replacement, High blood pressure, High cholesterol, Pleural effusion, and Shingles (2019).    PAST SURGICAL HISTORY: Past Surgical History:  Procedure Laterality Date   AORTIC VALVE REPLACEMENT  2012   CARDIAC VALVE REPLACEMENT     aortic   DILATION AND CURETTAGE OF UTERUS     EMPYEMA DRAINAGE  2017   LYMPH NODE BIOPSY N/A 02/25/2019   Procedure: Clement Husbands LYMPH NODE BIOPSY;  Surgeon: Everitt Amber, MD;  Location: [facility];  Service: Gynecology;  Laterality: N/A;   ROBOTIC ASSISTED TOTAL HYSTERECTOMY WITH BILATERAL SALPINGO OOPHERECTOMY Bilateral 02/25/2019   Procedure: XI ROBOTIC ASSISTED TOTAL HYSTERECTOMY WITH BILATERAL SALPINGO OOPHORECTOMY;  Surgeon: Everitt Amber, MD;  Location: [facility];  Service: Gynecology;  Laterality: Bilateral;   TUBAL LIGATION     VIDEO ASSISTED THORACOSCOPY (VATS)/EMPYEMA Right 09/07/2016   Procedure: VIDEO ASSISTED THORACOSCOPY (VATS)/EMPYEMA;  Surgeon: Gaye Pollack, MD;  Location: [facility] OR;  Service: Thoracic;  Laterality: Right;    FAMILY HISTORY: family history includes Cancer in her father; Dementia in her sister; Hypertension in her mother.  SOCIAL HISTORY:  reports that she has never smoked. She has never used smokeless tobacco. She reports that she does not drink alcohol or use drugs.  ALLERGIES: Atorvastatin  MEDICATIONS:  Current Outpatient Medications  Medication Sig Dispense Refill   aspirin EC 81 MG tablet Take 81 mg by mouth daily.      cholecalciferol (VITAMIN D3) 25 MCG (1000 UT) tablet Take 1,000 Units by mouth every  morning.     donepezil (ARICEPT) 10 MG tablet Take 1 tablet (10 mg total) by mouth at bedtime. 90 tablet 4     eplerenone (INSPRA) 25 MG tablet Take 25 mg by mouth every morning.      furosemide (LASIX) 20 MG tablet Take 20 mg by mouth daily.     LORazepam (ATIVAN) 0.5 MG tablet 1 tab every 8 hours as needed for agitation/anxiety that cannot be redirected. Watch for sedation and risk of falls. (Patient taking differently: Take 0.5 mg by mouth every 8 (eight) hours as needed for anxiety. May take 1 tab every 8 hours as needed for agitation/anxiety that cannot be redirected. Watch for sedation and risk of falls.) 30 tablet 4   magnesium oxide (MAG-OX) 400 MG tablet Take 400 mg by mouth at bedtime.     memantine (NAMENDA) 10 MG tablet TAKE (1) TABLET BY MOUTH TWICE DAILY. (Patient taking differently: Take 10 mg by mouth 2 (two) times daily. ) 60 tablet 11   metoprolol succinate (TOPROL-XL) 25 MG 24 hr tablet Take 25 mg by mouth every morning.      prednisoLONE acetate (PRED FORTE) 1 % ophthalmic suspension Place 1 drop into the left eye daily.   0   rosuvastatin (CRESTOR) 5 MG tablet Take 5 mg by mouth every morning.      senna-docusate (SENOKOT-S) 8.6-50 MG tablet Take 2 tablets by mouth at bedtime. 30 tablet 1   sertraline (ZOLOFT) 25 MG tablet Take 25 mg by mouth every morning.      valACYclovir (VALTREX) 1000 MG tablet Take 1 g by mouth every morning.  4   No current facility-administered medications for this encounter.     REVIEW OF SYSTEMS:  A 10+ POINT REVIEW OF SYSTEMS WAS OBTAINED including neurology, dermatology, psychiatry, cardiac, respiratory, lymph, extremities, GI, GU, musculoskeletal, constitutional, reproductive, HEENT. All pertinent positives are noted in the HPI. All others are negative.     PHYSICAL EXAM:  height is 5' 3"  (1.6 m) and weight is 230 lb 4 oz (104.4 kg). Her oral temperature is 97.9 F (36.6 C). Her blood pressure is 151/70 (abnormal) and her pulse is 75. Her respiration is 18 and oxygen saturation is 98%.   General: Alert and oriented, in no acute  distress HEENT: Head is normocephalic. Extraocular movements are intact. Oropharynx is clear. Neck: Neck is supple, no palpable cervical or supraclavicular lymphadenopathy. Heart: Regular in rate and rhythm with no murmurs, rubs, or gallops. Chest: Clear to auscultation bilaterally, with no rhonchi, wheezes, or rales. Abdomen: Soft, nontender, nondistended, with no rigidity or guarding. Small scars from her laparoscopic procedure, all healing well.  Extremities: No cyanosis or edema. Lymphatics: see Neck Exam Skin: No concerning lesions. Musculoskeletal: symmetric strength and muscle tone throughout. Neurologic: Cranial nerves II through XII are grossly intact. No obvious focalities. Speech is fluent. Coordination is intact. Psychiatric: Judgment and insight are intact. Affect is appropriate. Pelvic exam deferred until simulation and planning day.  ECOG = 1  0 - Asymptomatic (Fully active, able to carry on all predisease activities without restriction)  1 - Symptomatic but completely ambulatory (Restricted in physically strenuous activity but ambulatory and able to carry out work of a light or sedentary nature. For example, light housework, office work)  2 - Symptomatic, <50% in bed during the day (Ambulatory and capable of all self care but unable to carry out any work activities. Up and about more than 50% of waking hours)  3 - Symptomatic, >50%  in bed, but not bedbound (Capable of only limited self-care, confined to bed or chair 50% or more of waking hours)  4 - Bedbound (Completely disabled. Cannot carry on any self-care. Totally confined to bed or chair)  5 - Death   Eustace Pen MM, Creech RH, Tormey DC, et al. [phone]). "Toxicity and response criteria of the [facility]". [place] Oncol. 5 (6): 649-55  LABORATORY DATA:  Lab Results  Component Value Date   WBC 9.0 02/19/2019   HGB 14.5 02/19/2019   HCT 45.8 02/19/2019   MCV 104.8 (H) 02/19/2019   PLT 245  02/19/2019   NEUTROABS 5.7 11/08/2018   Lab Results  Component Value Date   NA 140 02/19/2019   K 4.1 02/19/2019   CL 104 02/19/2019   CO2 26 02/19/2019   GLUCOSE 107 (H) 02/19/2019   CREATININE 1.13 (H) 02/19/2019   CALCIUM 8.9 02/19/2019      RADIOGRAPHY: No results found.    IMPRESSION: stage1B grade 2endometrial cancer. Given the patient's age and pathologic findings, she would be considered high/intermediate risk of vaginal cuff recurrence and therefore I would recommend vaginal brachytherapy as part of her overall management.    Today, I talked to the patient  about the findings and work-up thus far.  We discussed the natural history of her condition and general treatment, highlighting the role of radiotherapy (vaginal brachytherapy) in the management.  We discussed the available radiation techniques, and focused on the details of logistics and delivery.  We reviewed the anticipated acute and late sequelae associated with radiation in this setting.  The patient was encouraged to ask questions that I answered to the best of my ability.  A patient consent form was discussed and signed.  We retained a copy for our records.  The patient would like to proceed with radiation and will be scheduled for CT simulation.   PLAN: She will be setup for planning and her first treatment in the next few weeks. Anticipate 5 high dose rate brachytherapy treatments, directed at the vaginal cuff.      ------------------------------------------------  Blair Promise, PhD, MD      This document serves as a record of services personally performed by Gery Pray, MD. It was created on his behalf by Mary-Margaret Loma Messing, a trained medical scribe. The creation of this record is based on the scribe's personal observations and the provider's statements to them. This document has been checked and approved by the attending provider.

## 2019-04-12 NOTE — Patient Instructions (Signed)
Coronavirus (COVID-19) Are you at risk?  Are you at risk for the Coronavirus (COVID-19)?  To be considered HIGH RISK for Coronavirus (COVID-19), you have to meet the following criteria:  . Traveled to [place], [place], [place], [place] or [place]; or in the [place] to [place], [place], [place], or [place]; and have fever, cough, and shortness of breath within the last 2 weeks of travel OR . Been in close contact with a person diagnosed with COVID-19 within the last 2 weeks and have fever, cough, and shortness of breath . IF YOU DO NOT MEET THESE CRITERIA, YOU ARE CONSIDERED LOW RISK FOR COVID-19.  What to do if you are HIGH RISK for COVID-19?  . If you are having a medical emergency, call 911. . Seek medical care right away. Before you go to a doctor's office, urgent care or emergency department, call ahead and tell them about your recent travel, contact with someone diagnosed with COVID-19, and your symptoms. You should receive instructions from your physician's office regarding next steps of care.  . When you arrive at healthcare provider, tell the healthcare staff immediately you have returned from visiting [place], [place], [place], [place] or [place]; or traveled in the [place] to [place], [place], [place], or [place]; in the last two weeks or you have been in close contact with a person diagnosed with COVID-19 in the last 2 weeks.   . Tell the health care staff about your symptoms: fever, cough and shortness of breath. . After you have been seen by a medical provider, you will be either: o Tested for (COVID-19) and discharged home on quarantine except to seek medical care if symptoms worsen, and asked to  - Stay home and avoid contact with others until you get your results (4-5 days)  - Avoid travel on public transportation if possible (such as bus, train, or airplane) or o Sent to the Emergency Department by EMS for evaluation, COVID-19 testing, and possible  admission depending on your condition and test results.  What to do if you are LOW RISK for COVID-19?  Reduce your risk of any infection by using the same precautions used for avoiding the common cold or flu:  . Wash your hands often with soap and warm water for at least 20 seconds.  If soap and water are not readily available, use an alcohol-based hand sanitizer with at least 60% alcohol.  . If coughing or sneezing, cover your mouth and nose by coughing or sneezing into the elbow areas of your shirt or coat, into a tissue or into your sleeve (not your hands). . Avoid shaking hands with others and consider head nods or verbal greetings only. . Avoid touching your eyes, nose, or mouth with unwashed hands.  . Avoid close contact with people who are sick. . Avoid places or events with large numbers of people in one location, like concerts or sporting events. . Carefully consider travel plans you have or are making. . If you are planning any travel outside or inside the [place], visit the CDC's Travelers' Health webpage for the latest health notices. . If you have some symptoms but not all symptoms, continue to monitor at home and seek medical attention if your symptoms worsen. . If you are having a medical emergency, call 911.   ADDITIONAL HEALTHCARE OPTIONS FOR PATIENTS  [facility] [facility] / e-Visit: [email or web address]         [facility]: [phone]     Urgent Care: [phone]                   [facility]: [phone]   

## 2019-04-12 NOTE — Progress Notes (Signed)
GYN Location of Tumor / Histology: stage 1B grade 2 endometrial cancer.  Judith Schroeder presented with symptoms of: She reported vaginal bleeding to her daughter in January. This persisted and so she was seen by Dr Glo Herring on 01/26/19 who performed a TVUS which showed a thickened endometrium.  She then underwent a biopsy the same day. This showed grade 2 endometrioid endometrial cancer.   The patient has progressive dementia but is conversant and cooperative and able to give some history herself. She ambulates independently and is independent with ADL's.  Biopsies revealed: 02/25/19:  Diagnosis 1. Lymph node, sentinel, biopsy, right internal iliac - THREE LYMPH NODE WITH ENDOSALPINGIOSIS. - NO METASTATIC CARCINOMA IDENTIFIED. 2. Lymph node, sentinel, biopsy, left common iliac - ONE LYMPH NODE WITH NO METASTATIC CARCINOMA. 3. Lymph node, sentinel, biopsy, left proximal common iliac - ONE LYMPH NODE WITH NO METASTATIC CARCINOMA. 4. Uterus +/- tubes/ovaries, neoplastic, cervix, bilateral fallopian tubes and ovaries ENDOMETRIUM: - ENDOMETRIOID ADENOCARCINOMA, 4.5 CM, FIGO GRADE II. - FOCAL DEEP MYOMETRIAL INVASION. - MARGINS NOT INVOLVED. - CERVIX, BILATERAL OVARIES AND BILATERAL FALLOPIAN TUBES FREE OF TUMOR. Microscopic Comment 4. UTERUS, CARCINOMA OR CARCINOSARCOMA Procedure: Hysterectomy with bilateral salpingo-oophorectomy and right internal iliac, left common iliac and left proximal common iliac lymph nodes. Histologic type: Endometrioid adenocarcinoma. Histologic Grade: FIGO grade II   Past/Anticipated interventions by Gyn/Onc surgery, if any: 02/25/19:  Operation: Robotic-assisted laparoscopic total hysterectomy with bilateral salpingoophorectomy, SLN biopsy   Surgeon: Donaciano Eva  Past/Anticipated interventions by medical oncology, if any: None at this time.  Weight changes, if any:  Wt Readings from Last 3 Encounters:  04/12/19 230 lb 4 oz (104.4 kg)  03/17/19  226 lb (102.5 kg)  02/25/19 215 lb 3.2 oz (97.6 kg)      Pain issues, if any:  Pt denies c/o pain.  SAFETY ISSUES:  Prior radiation? No  Pacemaker/ICD? No  Possible current pregnancy? No  Is the patient on methotrexate? No  Current Complaints / other details:  Pt presents today for initial consult with Dr. Sondra Come. Pt is accompanied by GYN Navigator due to visitor restrictions for Covid 19 and pt's dementia.   BP (!) 151/70 (BP Location: Left Arm, Patient Position: Sitting)   Pulse 75   Temp 97.9 F (36.6 C) (Oral)   Resp 18   Ht 5\' 3"  (1.6 m)   Wt 230 lb 4 oz (104.4 kg)   SpO2 98%   BMI 40.79 kg/m   Loma Sousa, RN BSN

## 2019-04-13 ENCOUNTER — Telehealth: Payer: Self-pay | Admitting: *Deleted

## 2019-04-13 NOTE — Telephone Encounter (Signed)
Called patient's daughter Ambrose Finland to inform of [facility], spoke with patient's daughter- Maurice March and she is aware of these dates and times and she is good with them

## 2019-04-14 DIAGNOSIS — I10 Essential (primary) hypertension: Secondary | ICD-10-CM | POA: Diagnosis not present

## 2019-04-14 DIAGNOSIS — Z4800 Encounter for change or removal of nonsurgical wound dressing: Secondary | ICD-10-CM | POA: Diagnosis not present

## 2019-04-14 DIAGNOSIS — F0390 Unspecified dementia without behavioral disturbance: Secondary | ICD-10-CM | POA: Diagnosis not present

## 2019-04-14 DIAGNOSIS — S81802D Unspecified open wound, left lower leg, subsequent encounter: Secondary | ICD-10-CM | POA: Diagnosis not present

## 2019-04-14 DIAGNOSIS — Z7982 Long term (current) use of aspirin: Secondary | ICD-10-CM | POA: Diagnosis not present

## 2019-04-14 DIAGNOSIS — L03116 Cellulitis of left lower limb: Secondary | ICD-10-CM | POA: Diagnosis not present

## 2019-04-19 ENCOUNTER — Ambulatory Visit: Payer: Medicare Other | Admitting: Radiation Oncology

## 2019-04-19 ENCOUNTER — Ambulatory Visit: Payer: Medicare Other

## 2019-04-19 ENCOUNTER — Telehealth: Payer: Self-pay | Admitting: Oncology

## 2019-04-19 NOTE — Telephone Encounter (Signed)
Left a message for [name] regarding Judith Schroeder's appointments on Wednesday.  Advised her that I will meet Judith Schroeder in the [facility] lobby and escort her to the appointments in Radiation.

## 2019-04-20 ENCOUNTER — Telehealth: Payer: Self-pay | Admitting: *Deleted

## 2019-04-20 NOTE — Telephone Encounter (Signed)
CALLED PATIENT TO REMIND OF NEW HDR [place] FOR 04-21-19, SPOKE WITH PATIENT'S DAUGHTER- DONNA PRYOR AND SHE IS AWARE OF THESE APPTS.

## 2019-04-21 ENCOUNTER — Ambulatory Visit
Admission: RE | Admit: 2019-04-21 | Discharge: 2019-04-21 | Disposition: A | Discharge status: Home / Self Care | Payer: Medicare Other | Source: Ambulatory Visit | Attending: Radiation Oncology | Admitting: Radiation Oncology

## 2019-04-21 ENCOUNTER — Encounter: Payer: Self-pay | Admitting: Radiation Oncology

## 2019-04-21 ENCOUNTER — Other Ambulatory Visit: Payer: Self-pay

## 2019-04-21 ENCOUNTER — Encounter: Payer: Self-pay | Admitting: Oncology

## 2019-04-21 VITALS — BP 129/64 | HR 81 | Temp 98.2°F | Resp 20 | Ht 63.0 in | Wt 227.2 lb

## 2019-04-21 DIAGNOSIS — I10 Essential (primary) hypertension: Secondary | ICD-10-CM | POA: Diagnosis not present

## 2019-04-21 DIAGNOSIS — Z7982 Long term (current) use of aspirin: Secondary | ICD-10-CM | POA: Diagnosis not present

## 2019-04-21 DIAGNOSIS — Z79899 Other long term (current) drug therapy: Secondary | ICD-10-CM | POA: Insufficient documentation

## 2019-04-21 DIAGNOSIS — C541 Malignant neoplasm of endometrium: Secondary | ICD-10-CM

## 2019-04-21 DIAGNOSIS — L03116 Cellulitis of left lower limb: Secondary | ICD-10-CM | POA: Diagnosis not present

## 2019-04-21 DIAGNOSIS — Z4800 Encounter for change or removal of nonsurgical wound dressing: Secondary | ICD-10-CM | POA: Diagnosis not present

## 2019-04-21 DIAGNOSIS — S81802D Unspecified open wound, left lower leg, subsequent encounter: Secondary | ICD-10-CM | POA: Diagnosis not present

## 2019-04-21 DIAGNOSIS — F0390 Unspecified dementia without behavioral disturbance: Secondary | ICD-10-CM | POA: Diagnosis not present

## 2019-04-21 NOTE — Patient Instructions (Signed)
Coronavirus (COVID-19) Are you at risk?  Are you at risk for the Coronavirus (COVID-19)?  To be considered HIGH RISK for Coronavirus (COVID-19), you have to meet the following criteria:  . Traveled to [place], [place], [place], [place] or [place]; or in the [place] to [place], [place], [place], or [place]; and have fever, cough, and shortness of breath within the last 2 weeks of travel OR . Been in close contact with a person diagnosed with COVID-19 within the last 2 weeks and have fever, cough, and shortness of breath . IF YOU DO NOT MEET THESE CRITERIA, YOU ARE CONSIDERED LOW RISK FOR COVID-19.  What to do if you are HIGH RISK for COVID-19?  . If you are having a medical emergency, call 911. . Seek medical care right away. Before you go to a doctor's office, urgent care or emergency department, call ahead and tell them about your recent travel, contact with someone diagnosed with COVID-19, and your symptoms. You should receive instructions from your physician's office regarding next steps of care.  . When you arrive at healthcare provider, tell the healthcare staff immediately you have returned from visiting [place], [place], [place], [place] or [place]; or traveled in the [place] to [place], [place], [place], or [place]; in the last two weeks or you have been in close contact with a person diagnosed with COVID-19 in the last 2 weeks.   . Tell the health care staff about your symptoms: fever, cough and shortness of breath. . After you have been seen by a medical provider, you will be either: o Tested for (COVID-19) and discharged home on quarantine except to seek medical care if symptoms worsen, and asked to  - Stay home and avoid contact with others until you get your results (4-5 days)  - Avoid travel on public transportation if possible (such as bus, train, or airplane) or o Sent to the Emergency Department by EMS for evaluation, COVID-19 testing, and possible  admission depending on your condition and test results.  What to do if you are LOW RISK for COVID-19?  Reduce your risk of any infection by using the same precautions used for avoiding the common cold or flu:  . Wash your hands often with soap and warm water for at least 20 seconds.  If soap and water are not readily available, use an alcohol-based hand sanitizer with at least 60% alcohol.  . If coughing or sneezing, cover your mouth and nose by coughing or sneezing into the elbow areas of your shirt or coat, into a tissue or into your sleeve (not your hands). . Avoid shaking hands with others and consider head nods or verbal greetings only. . Avoid touching your eyes, nose, or mouth with unwashed hands.  . Avoid close contact with people who are sick. . Avoid places or events with large numbers of people in one location, like concerts or sporting events. . Carefully consider travel plans you have or are making. . If you are planning any travel outside or inside the [place], visit the CDC's Travelers' Health webpage for the latest health notices. . If you have some symptoms but not all symptoms, continue to monitor at home and seek medical attention if your symptoms worsen. . If you are having a medical emergency, call 911.   ADDITIONAL HEALTHCARE OPTIONS FOR PATIENTS  [place] Tele[facility] / e-Visit: [email or web address]         [facility]: [phone]  [place]   Urgent Care: [phone]                   [facility]: [phone]   

## 2019-04-21 NOTE — Progress Notes (Signed)
  Radiation Oncology         [phone] ________________________________  Name: Judith Schroeder MRN: [id number]  Date: 04/21/2019  DOB: 02/26/1946  SIMULATION AND TREATMENT PLANNING NOTE HDR BRACHYTHERAPY  DIAGNOSIS:  Endometrial cancer ([place]).  stage1B grade 2endometrial cancer  NARRATIVE:  The patient was brought to the [facility].  Identity was confirmed.  All relevant records and images related to the planned course of therapy were reviewed.  The patient freely provided informed written consent to proceed with treatment after reviewing the details related to the planned course of therapy. The consent form was witnessed and verified by the simulation staff.  Then, the patient was set-up in a stable reproducible  supine position for radiation therapy.  CT images were obtained.  Surface markings were placed.  The CT images were loaded into the planning software.  Then the target and avoidance structures were contoured.  Treatment planning then occurred.  The radiation prescription was entered and confirmed.   I have requested : Brachytherapy Isodose Plan and Dosimetry Calculations to plan the radiation distribution.    PLAN:  The patient will receive 30 Gy in 5 fractions directed at the proximal vagina.  Patient will be treated with a 3 cm diameter cylinder with a treatment length of 3 cm.  Prescription will be to the vaginal mucosal surface. iridium 192 will be the high-dose-rate source.    ________________________________  Blair Promise, PhD, MD   This document serves as a record of services personally performed by Gery Pray, MD. It was created on his behalf by Mary-Margaret Loma Messing, a trained medical scribe. The creation of this record is based on the scribe's personal observations and the provider's statements to them. This document has been checked and approved by the attending provider.

## 2019-04-21 NOTE — Progress Notes (Signed)
Radiation Oncology         (336) [phone] ________________________________  Name: Judith Schroeder MRN: [id number]  Date: 04/21/2019  DOB: [birth year]  Vaginal Brachytherapy Procedure Note  CC: [name], MD [name], MD    ICD-10-CM   1. Endometrial cancer ([place]) C54.1     Diagnosis: Endometrial cancer ([place]).  stage1B grade 2endometrial cancer    Narrative: She returns today for vaginal cylinder fitting.  She reports no new problems since her initial consultation.  Patient denies any pelvic pain or vaginal bleeding.  Denies any urinary symptoms  ALLERGIES: is allergic to atorvastatin.  Meds: Current Outpatient Medications  Medication Sig Dispense Refill  . aspirin EC 81 MG tablet Take 81 mg by mouth daily.     . cholecalciferol (VITAMIN D3) 25 MCG (1000 UT) tablet Take 1,000 Units by mouth every morning.    . donepezil (ARICEPT) 10 MG tablet Take 1 tablet (10 mg total) by mouth at bedtime. 90 tablet 4  . eplerenone (INSPRA) 25 MG tablet Take 25 mg by mouth every morning.     . furosemide (LASIX) 20 MG tablet Take 20 mg by mouth daily.    Marland Kitchen LORazepam (ATIVAN) 0.5 MG tablet 1 tab every 8 hours as needed for agitation/anxiety that cannot be redirected. Watch for sedation and risk of falls. (Patient taking differently: Take 0.5 mg by mouth every 8 (eight) hours as needed for anxiety. May take 1 tab every 8 hours as needed for agitation/anxiety that cannot be redirected. Watch for sedation and risk of falls.) 30 tablet 4  . magnesium oxide (MAG-OX) 400 MG tablet Take 400 mg by mouth at bedtime.    . memantine (NAMENDA) 10 MG tablet TAKE (1) TABLET BY MOUTH TWICE DAILY. (Patient taking differently: Take 10 mg by mouth 2 (two) times daily. ) 60 tablet 11  . metoprolol succinate (TOPROL-XL) 25 MG 24 hr tablet Take 25 mg by mouth every morning.     . prednisoLONE acetate (PRED FORTE) 1 % ophthalmic suspension Place 1 drop into the left eye daily.   0  . rosuvastatin (CRESTOR) 5 MG  tablet Take 5 mg by mouth every morning.     . senna-docusate (SENOKOT-S) 8.6-50 MG tablet Take 2 tablets by mouth at bedtime. 30 tablet 1  . sertraline (ZOLOFT) 25 MG tablet Take 25 mg by mouth every morning.     . valACYclovir (VALTREX) 1000 MG tablet Take 1 g by mouth every morning.  4   No current facility-administered medications for this encounter.     Physical Findings: The patient is in no acute distress. Patient is alert and oriented.  height is 5\' 3"  (1.6 m) and weight is 227 lb 3.2 oz (103.1 kg). Her oral temperature is 98.2 F (36.8 C). Her blood pressure is 129/64 and her pulse is 81. Her respiration is 20 and oxygen saturation is 96%.   No palpable cervical, supraclavicular or axillary lymphoadenopathy. The heart has a regular rate and rhythm. The lungs are clear to auscultation. Abdomen soft and non-tender.  On pelvic examination the external genitalia were unremarkable. A speculum exam was performed. Vaginal cuff intact, no mucosal lesions. On bimanual exam there were no pelvic masses appreciated.  Lab Findings: Lab Results  Component Value Date   WBC 9.0 02/19/2019   HGB 14.5 02/19/2019   HCT 45.8 02/19/2019   MCV 104.8 (H) 02/19/2019   PLT 245 02/19/2019    Radiographic Findings: No results found.  Impression: stage1B grade 2endometrial cancer  Patient was fitted for a vaginal cylinder. The patient will be treated with a 3.0 cm diameter cylinder with a treatment length of 3.0 cm. This distended the vaginal vault without undue discomfort. The patient tolerated the procedure well.  The patient was successfully fitted for a vaginal cylinder. The patient is appropriate to begin vaginal brachytherapy.   Plan: The patient will proceed with CT simulation and vaginal brachytherapy today.    _______________________________   Blair Promise, PhD, MD This document serves as a record of services personally performed by Gery Pray, MD. It was created on his behalf  by Mary-Margaret Loma Messing, a trained medical scribe. The creation of this record is based on the scribe's personal observations and the provider's statements to them. This document has been checked and approved by the attending provider.

## 2019-04-21 NOTE — Progress Notes (Signed)
Called [name] and let her know that Carrine is in the Radiation department waiting to see Dr. Sondra Come.  Discussed that she will be able to wait in an exam room for her treatment and that she will be ordered a breakfast tray.  Also discussed that she will be escorted to the parking lot when she is finished.

## 2019-04-21 NOTE — Progress Notes (Signed)
Patient presents today for new [facility] HDR with Dr Dwana Curd denies complaints of pain. Denies any dysuria or hematuria denies vaginal bleeding, denies any diarrhea or constipation. Side effects reviewed by this RN.  BP 129/64 (BP Location: Left Arm, Patient Position: Sitting)   Pulse 81   Temp 98.2 F (36.8 C) (Oral)   Resp 20   Ht 5\' 3"  (1.6 m)   Wt 227 lb 3.2 oz (103.1 kg)   SpO2 96%   BMI 40.25 kg/m

## 2019-04-21 NOTE — Progress Notes (Signed)
  Radiation Oncology         [phone] ________________________________  Name: Judith Schroeder MRN: [id number]  Date: 04/21/2019  DOB: 05/17/1946  CC: [name], MD  [name], MD  HDR BRACHYTHERAPY NOTE  DIAGNOSIS: Endometrial cancer [facility]). stage1B grade 2endometrial cancer   Simple treatment device note: Patient had construction of her custom vaginal cylinder. She will be treated with a 3.0 cm diameter segmented cylinder. This conforms to her anatomy without undue discomfort.  Vaginal brachytherapy procedure node: The patient was brought to the [facility] suite. Identity was confirmed. All relevant records and images related to the planned course of therapy were reviewed. The patient freely provided informed written consent to proceed with treatment after reviewing the details related to the planned course of therapy. The consent form was witnessed and verified by the simulation staff. Then, the patient was set-up in a stable reproducible supine position for radiation therapy. Pelvic exam revealed the vaginal cuff to be intact . The patient's custom vaginal cylinder was placed in the proximal vagina. This was affixed to the CT/MR stabilization plate to prevent slippage. Patient tolerated the placement well.  Verification simulation note:  A fiducial marker was placed within the vaginal cylinder. An AP and lateral film was then obtained through the pelvis area. This documented accurate position of the vaginal cylinder for treatment.  HDR BRACHYTHERAPY TREATMENT  The remote afterloading device was affixed to the vaginal cylinder by catheter. Patient then proceeded to undergo her first high-dose-rate treatment directed at the proximal vagina. The patient was prescribed a dose of 6.0 gray to be delivered to the mucosal surface. Treatment length was 3.0 cm. Patient was treated with 1 channel using 7 dwell positions. Treatment time was 215.6 seconds. Iridium 192 was the high-dose-rate source for  treatment. The patient tolerated the treatment well. After completion of her therapy, a radiation survey was performed documenting return of the iridium source into the GammaMed safe.   PLAN: the patient will return next week for her second HDR treatment. ________________________________  Blair Promise, PhD, MD  This document serves as a record of services personally performed by Gery Pray, MD. It was created on his behalf by Mary-Margaret Loma Messing, a trained medical scribe. The creation of this record is based on the scribe's personal observations and the provider's statements to them. This document has been checked and approved by the attending provider.

## 2019-04-22 DIAGNOSIS — Z7982 Long term (current) use of aspirin: Secondary | ICD-10-CM | POA: Diagnosis not present

## 2019-04-22 DIAGNOSIS — L03116 Cellulitis of left lower limb: Secondary | ICD-10-CM | POA: Diagnosis not present

## 2019-04-22 DIAGNOSIS — S81802D Unspecified open wound, left lower leg, subsequent encounter: Secondary | ICD-10-CM | POA: Diagnosis not present

## 2019-04-22 DIAGNOSIS — F0390 Unspecified dementia without behavioral disturbance: Secondary | ICD-10-CM | POA: Diagnosis not present

## 2019-04-22 DIAGNOSIS — Z4800 Encounter for change or removal of nonsurgical wound dressing: Secondary | ICD-10-CM | POA: Diagnosis not present

## 2019-04-22 DIAGNOSIS — I10 Essential (primary) hypertension: Secondary | ICD-10-CM | POA: Diagnosis not present

## 2019-04-23 ENCOUNTER — Telehealth: Payer: Self-pay | Admitting: *Deleted

## 2019-04-23 DIAGNOSIS — R609 Edema, unspecified: Secondary | ICD-10-CM | POA: Diagnosis not present

## 2019-04-23 DIAGNOSIS — C541 Malignant neoplasm of endometrium: Secondary | ICD-10-CM | POA: Diagnosis not present

## 2019-04-23 DIAGNOSIS — G309 Alzheimer's disease, unspecified: Secondary | ICD-10-CM | POA: Diagnosis not present

## 2019-04-23 DIAGNOSIS — S81802D Unspecified open wound, left lower leg, subsequent encounter: Secondary | ICD-10-CM | POA: Diagnosis not present

## 2019-04-23 NOTE — Telephone Encounter (Signed)
CALLED PATIENT TO REMIND OF HDR [place] 04-26-19 @ 9 AM, SPOKE WITH PATIENT'S DAUGHTER- DONNA PRYOR, SPOKE WITH HER DAUGHTER DONNA PRYOR AND SHE IS AWARE OF THIS [place].

## 2019-04-26 ENCOUNTER — Other Ambulatory Visit: Payer: Self-pay

## 2019-04-26 ENCOUNTER — Ambulatory Visit
Admission: RE | Admit: 2019-04-26 | Discharge: 2019-04-26 | Disposition: A | Discharge status: Home / Self Care | Payer: Medicare Other | Source: Ambulatory Visit | Attending: Radiation Oncology | Admitting: Radiation Oncology

## 2019-04-26 DIAGNOSIS — L03116 Cellulitis of left lower limb: Secondary | ICD-10-CM | POA: Diagnosis not present

## 2019-04-26 DIAGNOSIS — C541 Malignant neoplasm of endometrium: Secondary | ICD-10-CM

## 2019-04-26 DIAGNOSIS — S81802D Unspecified open wound, left lower leg, subsequent encounter: Secondary | ICD-10-CM | POA: Diagnosis not present

## 2019-04-26 DIAGNOSIS — Z4800 Encounter for change or removal of nonsurgical wound dressing: Secondary | ICD-10-CM | POA: Diagnosis not present

## 2019-04-26 DIAGNOSIS — I10 Essential (primary) hypertension: Secondary | ICD-10-CM | POA: Diagnosis not present

## 2019-04-26 DIAGNOSIS — Z5181 Encounter for therapeutic drug level monitoring: Secondary | ICD-10-CM | POA: Diagnosis not present

## 2019-04-26 DIAGNOSIS — F0390 Unspecified dementia without behavioral disturbance: Secondary | ICD-10-CM | POA: Diagnosis not present

## 2019-04-26 DIAGNOSIS — Z7982 Long term (current) use of aspirin: Secondary | ICD-10-CM | POA: Diagnosis not present

## 2019-04-26 NOTE — Progress Notes (Signed)
  Radiation Oncology         [phone] ________________________________  Name: Judith Schroeder MRN: [id number]  Date: 04/26/2019  DOB: 24-May-1946  CC: [name], MD  [name], MD  HDR BRACHYTHERAPY NOTE  DIAGNOSIS: Endometrial cancer [place]). stage1B grade 2endometrial cancer   Simple treatment device note: Patient had construction of her custom vaginal cylinder. She will be treated with a 3.0 cm diameter segmented cylinder. This conforms to her anatomy without undue discomfort.  Vaginal brachytherapy procedure node: The patient was brought to the [facility] suite. Identity was confirmed. All relevant records and images related to the planned course of therapy were reviewed. The patient freely provided informed written consent to proceed with treatment after reviewing the details related to the planned course of therapy. The consent form was witnessed and verified by the simulation staff. Then, the patient was set-up in a stable reproducible supine position for radiation therapy. Pelvic exam revealed the vaginal cuff to be intact . The patient's custom vaginal cylinder was placed in the proximal vagina. This was affixed to the CT/MR stabilization plate to prevent slippage. Patient tolerated the placement well.  Verification simulation note:  A fiducial marker was placed within the vaginal cylinder. An AP and lateral film was then obtained through the pelvis area. This documented accurate position of the vaginal cylinder for treatment.  HDR BRACHYTHERAPY TREATMENT  The remote afterloading device was affixed to the vaginal cylinder by catheter. Patient then proceeded to undergo her second high-dose-rate treatment directed at the proximal vagina. The patient was prescribed a dose of 6.0 gray to be delivered to the mucosal surface. Treatment length was 3.0 cm. Patient was treated with 1 channel using 7 dwell positions. Treatment time was 225.90 seconds. Iridium 192 was the high-dose-rate source for  treatment. The patient tolerated the treatment well. After completion of her therapy, a radiation survey was performed documenting return of the iridium source into the GammaMed safe.   PLAN: the patient will return next week for her third HDR treatment. ________________________________  Blair Promise, PhD, MD  This document serves as a record of services personally performed by Gery Pray, MD. It was created on his behalf by Mary-Margaret Loma Messing, a trained medical scribe. The creation of this record is based on the scribe's personal observations and the provider's statements to them. This document has been checked and approved by the attending provider.

## 2019-04-28 ENCOUNTER — Ambulatory Visit: Payer: Medicare Other | Admitting: Radiation Oncology

## 2019-04-29 ENCOUNTER — Encounter (HOSPITAL_BASED_OUTPATIENT_CLINIC_OR_DEPARTMENT_OTHER): Payer: Medicare Other | Attending: Internal Medicine

## 2019-04-29 DIAGNOSIS — L97222 Non-pressure chronic ulcer of left calf with fat layer exposed: Secondary | ICD-10-CM | POA: Insufficient documentation

## 2019-04-29 DIAGNOSIS — Z952 Presence of prosthetic heart valve: Secondary | ICD-10-CM | POA: Insufficient documentation

## 2019-04-29 DIAGNOSIS — F0390 Unspecified dementia without behavioral disturbance: Secondary | ICD-10-CM | POA: Insufficient documentation

## 2019-04-29 DIAGNOSIS — S81802A Unspecified open wound, left lower leg, initial encounter: Secondary | ICD-10-CM | POA: Diagnosis not present

## 2019-04-29 DIAGNOSIS — C52 Malignant neoplasm of vagina: Secondary | ICD-10-CM | POA: Insufficient documentation

## 2019-04-29 DIAGNOSIS — I10 Essential (primary) hypertension: Secondary | ICD-10-CM | POA: Insufficient documentation

## 2019-04-29 DIAGNOSIS — I890 Lymphedema, not elsewhere classified: Secondary | ICD-10-CM | POA: Insufficient documentation

## 2019-04-30 ENCOUNTER — Telehealth: Payer: Self-pay | Admitting: *Deleted

## 2019-04-30 NOTE — Telephone Encounter (Signed)
Called patient to remind of HDR Tx. for 05-03-19 @ 9 am, spoke with patient's daughter- Ambrose Finland and she is aware of this treatment

## 2019-05-03 ENCOUNTER — Ambulatory Visit
Admission: RE | Admit: 2019-05-03 | Discharge: 2019-05-03 | Disposition: A | Discharge status: Home / Self Care | Payer: Medicare Other | Source: Ambulatory Visit | Attending: Radiation Oncology | Admitting: Radiation Oncology

## 2019-05-03 ENCOUNTER — Other Ambulatory Visit: Payer: Self-pay

## 2019-05-03 DIAGNOSIS — C541 Malignant neoplasm of endometrium: Secondary | ICD-10-CM | POA: Diagnosis not present

## 2019-05-03 NOTE — Progress Notes (Signed)
  Radiation Oncology         [phone]) [phone] ________________________________  Name: AKI BURDIN MRN: [id number]  Date: 05/03/2019  DOB: [birth year]  CC: [name], MD  [name], MD  HDR BRACHYTHERAPY NOTE  DIAGNOSIS: Endometrial cancer [facility]). stage1B grade 2endometrial cancer   Simple treatment device note: Patient had construction of her custom vaginal cylinder. She will be treated with a 3.0 cm diameter segmented cylinder. This conforms to her anatomy without undue discomfort.  Vaginal brachytherapy procedure node: The patient was brought to the [place] suite. Identity was confirmed. All relevant records and images related to the planned course of therapy were reviewed. The patient freely provided informed written consent to proceed with treatment after reviewing the details related to the planned course of therapy. The consent form was witnessed and verified by the simulation staff. Then, the patient was set-up in a stable reproducible supine position for radiation therapy. Pelvic exam revealed the vaginal cuff to be intact . The patient's custom vaginal cylinder was placed in the proximal vagina. This was affixed to the CT/MR stabilization plate to prevent slippage. Patient tolerated the placement well.  Verification simulation note:  A fiducial marker was placed within the vaginal cylinder. An AP and lateral film was then obtained through the pelvis area. This documented accurate position of the vaginal cylinder for treatment.  HDR BRACHYTHERAPY TREATMENT  The remote afterloading device was affixed to the vaginal cylinder by catheter. Patient then proceeded to undergo her third high-dose-rate treatment directed at the proximal vagina. The patient was prescribed a dose of 6.0 gray to be delivered to the mucosal surface. Treatment length was 3.0 cm. Patient was treated with 1 channel using 7 dwell positions. Treatment time was 241.2 seconds. Iridium 192 was the high-dose-rate source for  treatment. The patient tolerated the treatment well. After completion of her therapy, a radiation survey was performed documenting return of the iridium source into the GammaMed safe.   PLAN: the patient will return next week for her fourth HDR treatment. ________________________________  Blair Promise, PhD, MD  This document serves as a record of services personally performed by Gery Pray, MD. It was created on his behalf by Mary-Margaret Loma Messing, a trained medical scribe. The creation of this record is based on the scribe's personal observations and the provider's statements to them. This document has been checked and approved by the attending provider.

## 2019-05-04 DIAGNOSIS — I10 Essential (primary) hypertension: Secondary | ICD-10-CM | POA: Diagnosis not present

## 2019-05-04 DIAGNOSIS — Z7982 Long term (current) use of aspirin: Secondary | ICD-10-CM | POA: Diagnosis not present

## 2019-05-04 DIAGNOSIS — Z4800 Encounter for change or removal of nonsurgical wound dressing: Secondary | ICD-10-CM | POA: Diagnosis not present

## 2019-05-04 DIAGNOSIS — S81802D Unspecified open wound, left lower leg, subsequent encounter: Secondary | ICD-10-CM | POA: Diagnosis not present

## 2019-05-04 DIAGNOSIS — L03116 Cellulitis of left lower limb: Secondary | ICD-10-CM | POA: Diagnosis not present

## 2019-05-04 DIAGNOSIS — F0390 Unspecified dementia without behavioral disturbance: Secondary | ICD-10-CM | POA: Diagnosis not present

## 2019-05-05 ENCOUNTER — Ambulatory Visit: Payer: Medicare Other | Admitting: Radiation Oncology

## 2019-05-06 DIAGNOSIS — Z952 Presence of prosthetic heart valve: Secondary | ICD-10-CM | POA: Diagnosis not present

## 2019-05-06 DIAGNOSIS — I890 Lymphedema, not elsewhere classified: Secondary | ICD-10-CM | POA: Diagnosis not present

## 2019-05-06 DIAGNOSIS — L97222 Non-pressure chronic ulcer of left calf with fat layer exposed: Secondary | ICD-10-CM | POA: Diagnosis not present

## 2019-05-06 DIAGNOSIS — F0390 Unspecified dementia without behavioral disturbance: Secondary | ICD-10-CM | POA: Diagnosis not present

## 2019-05-06 DIAGNOSIS — I10 Essential (primary) hypertension: Secondary | ICD-10-CM | POA: Diagnosis not present

## 2019-05-06 DIAGNOSIS — C52 Malignant neoplasm of vagina: Secondary | ICD-10-CM | POA: Diagnosis not present

## 2019-05-11 ENCOUNTER — Telehealth: Payer: Self-pay | Admitting: *Deleted

## 2019-05-11 DIAGNOSIS — S81802D Unspecified open wound, left lower leg, subsequent encounter: Secondary | ICD-10-CM | POA: Diagnosis not present

## 2019-05-11 DIAGNOSIS — I10 Essential (primary) hypertension: Secondary | ICD-10-CM | POA: Diagnosis not present

## 2019-05-11 DIAGNOSIS — Z4800 Encounter for change or removal of nonsurgical wound dressing: Secondary | ICD-10-CM | POA: Diagnosis not present

## 2019-05-11 DIAGNOSIS — Z7982 Long term (current) use of aspirin: Secondary | ICD-10-CM | POA: Diagnosis not present

## 2019-05-11 DIAGNOSIS — L03116 Cellulitis of left lower limb: Secondary | ICD-10-CM | POA: Diagnosis not present

## 2019-05-11 DIAGNOSIS — F0390 Unspecified dementia without behavioral disturbance: Secondary | ICD-10-CM | POA: Diagnosis not present

## 2019-05-11 NOTE — Telephone Encounter (Signed)
CALLED PATIENT TO REMIND OF HDR [place] 05-12-19 @ 1 PM, LVM FOR A RETURN CALL

## 2019-05-12 ENCOUNTER — Other Ambulatory Visit: Payer: Self-pay

## 2019-05-12 ENCOUNTER — Ambulatory Visit
Admission: RE | Admit: 2019-05-12 | Discharge: 2019-05-12 | Disposition: A | Discharge status: Home / Self Care | Payer: Medicare Other | Source: Ambulatory Visit | Attending: Radiation Oncology | Admitting: Radiation Oncology

## 2019-05-12 DIAGNOSIS — C541 Malignant neoplasm of endometrium: Secondary | ICD-10-CM

## 2019-05-12 NOTE — Progress Notes (Signed)
  Radiation Oncology         (336) [phone] ________________________________  Name: Judith Schroeder MRN: [id number]  Date: 05/12/2019  DOB: [birth year]  CC: [name], MD  [name], MD  HDR BRACHYTHERAPY NOTE  DIAGNOSIS: Endometrial cancer [facility]). stage1B grade 2endometrial cancer   Simple treatment device note: Patient had construction of her custom vaginal cylinder. She will be treated with a 3.0 cm diameter segmented cylinder. This conforms to her anatomy without undue discomfort.  Vaginal brachytherapy procedure node: The patient was brought to the [place] suite. Identity was confirmed. All relevant records and images related to the planned course of therapy were reviewed. The patient freely provided informed written consent to proceed with treatment after reviewing the details related to the planned course of therapy. The consent form was witnessed and verified by the simulation staff. Then, the patient was set-up in a stable reproducible supine position for radiation therapy. Pelvic exam revealed the vaginal cuff to be intact . The patient's custom vaginal cylinder was placed in the proximal vagina. This was affixed to the CT/MR stabilization plate to prevent slippage. Patient tolerated the placement well.  Verification simulation note:  A fiducial marker was placed within the vaginal cylinder. An AP and lateral film was then obtained through the pelvis area. This documented accurate position of the vaginal cylinder for treatment.  HDR BRACHYTHERAPY TREATMENT  The remote afterloading device was affixed to the vaginal cylinder by catheter. Patient then proceeded to undergo her fourth high-dose-rate treatment directed at the proximal vagina. The patient was prescribed a dose of 6.0 gray to be delivered to the mucosal surface. Treatment length was 3.0 cm. Patient was treated with 1 channel using 7 dwell positions. Treatment time was 262.7 seconds. Iridium 192 was the high-dose-rate source for  treatment. The patient tolerated the treatment well. After completion of her therapy, a radiation survey was performed documenting return of the iridium source into the GammaMed safe.   PLAN: the patient will return 05/19/19 for her fifth and final HDR treatment. ________________________________  Blair Promise, PhD, MD  This document serves as a record of services personally performed by Gery Pray, MD. It was created on his behalf by Mary-Margaret Loma Messing, a trained medical scribe. The creation of this record is based on the scribe's personal observations and the provider's statements to them. This document has been checked and approved by the attending provider.

## 2019-05-14 DIAGNOSIS — I890 Lymphedema, not elsewhere classified: Secondary | ICD-10-CM | POA: Diagnosis not present

## 2019-05-14 DIAGNOSIS — F0390 Unspecified dementia without behavioral disturbance: Secondary | ICD-10-CM | POA: Diagnosis not present

## 2019-05-14 DIAGNOSIS — I10 Essential (primary) hypertension: Secondary | ICD-10-CM | POA: Diagnosis not present

## 2019-05-14 DIAGNOSIS — S81802A Unspecified open wound, left lower leg, initial encounter: Secondary | ICD-10-CM | POA: Diagnosis not present

## 2019-05-14 DIAGNOSIS — C52 Malignant neoplasm of vagina: Secondary | ICD-10-CM | POA: Diagnosis not present

## 2019-05-14 DIAGNOSIS — Z952 Presence of prosthetic heart valve: Secondary | ICD-10-CM | POA: Diagnosis not present

## 2019-05-14 DIAGNOSIS — L97222 Non-pressure chronic ulcer of left calf with fat layer exposed: Secondary | ICD-10-CM | POA: Diagnosis not present

## 2019-05-18 ENCOUNTER — Telehealth: Payer: Self-pay | Admitting: *Deleted

## 2019-05-18 DIAGNOSIS — L03116 Cellulitis of left lower limb: Secondary | ICD-10-CM | POA: Diagnosis not present

## 2019-05-18 DIAGNOSIS — Z4800 Encounter for change or removal of nonsurgical wound dressing: Secondary | ICD-10-CM | POA: Diagnosis not present

## 2019-05-18 DIAGNOSIS — F0390 Unspecified dementia without behavioral disturbance: Secondary | ICD-10-CM | POA: Diagnosis not present

## 2019-05-18 DIAGNOSIS — Z7982 Long term (current) use of aspirin: Secondary | ICD-10-CM | POA: Diagnosis not present

## 2019-05-18 DIAGNOSIS — S81802D Unspecified open wound, left lower leg, subsequent encounter: Secondary | ICD-10-CM | POA: Diagnosis not present

## 2019-05-18 DIAGNOSIS — I10 Essential (primary) hypertension: Secondary | ICD-10-CM | POA: Diagnosis not present

## 2019-05-18 NOTE — Telephone Encounter (Signed)
CALLED PATIENT TO REMIND OF HDR [place]. FOR 05-19-19 @ 10 AM, SPOKE WITH PATIENT'S DAUGHTER- DONNA PRYOR, AND SHE IS AWARE OF THIS [place].

## 2019-05-19 ENCOUNTER — Ambulatory Visit
Admission: RE | Admit: 2019-05-19 | Discharge: 2019-05-19 | Disposition: A | Discharge status: Home / Self Care | Payer: Medicare Other | Source: Ambulatory Visit | Attending: Radiation Oncology | Admitting: Radiation Oncology

## 2019-05-19 ENCOUNTER — Other Ambulatory Visit: Payer: Self-pay

## 2019-05-19 ENCOUNTER — Encounter: Payer: Self-pay | Admitting: Radiation Oncology

## 2019-05-19 DIAGNOSIS — C541 Malignant neoplasm of endometrium: Secondary | ICD-10-CM | POA: Diagnosis not present

## 2019-05-19 NOTE — Progress Notes (Signed)
  Radiation Oncology         [phone] ________________________________  Name: Judith Schroeder MRN: [id number]  Date: 05/19/2019  DOB: 06-26-1946  End of Treatment Note  Diagnosis:   Endometrial cancer ([place]). stage1B grade 2endometrial cancer     Indication for treatment:  Curative       Radiation treatment dates: 04/21/19, 04/26/19, 05/03/19, 05/12/19, 05/19/19  Site/dose:  Vagina, 6 Gy in 5 fractions for a total dose of 30 Gy  Beams/energy:   HDR Ir-Vaginal, Iridium HDR, patient was treated with a 3.0 cm diameter segmented cylinder, Rx length of 3.0 cm, prescription was 6 gray to the mucosal surface  Narrative: The patient tolerated radiation treatment relatively well. Overall the pt was without complaints.   Plan: The patient has completed radiation treatment. The patient will return to radiation oncology clinic for routine followup in one month. I advised them to call or return sooner if they have any questions or concerns related to their recovery or treatment.  -----------------------------------  Blair Promise, PhD, MD  This document serves as a record of services personally performed by Gery Pray, MD. It was created on his behalf by Mary-Margaret Loma Messing, a trained medical scribe. The creation of this record is based on the scribe's personal observations and the provider's statements to them. This document has been checked and approved by the attending provider.

## 2019-05-19 NOTE — Progress Notes (Signed)
  Radiation Oncology         [phone] ________________________________  Name: Judith Schroeder MRN: [id number]  Date: 05/19/2019  DOB: 1946-01-20  CC: [name], MD  [name], MD  HDR BRACHYTHERAPY NOTE  DIAGNOSIS: Endometrial cancer [facility]). stage1B grade 2endometrial cancer   Simple treatment device note: Patient had construction of her custom vaginal cylinder. She will be treated with a 3.0 cm diameter segmented cylinder. This conforms to her anatomy without undue discomfort.  Vaginal brachytherapy procedure node: The patient was brought to the [place] suite. Identity was confirmed. All relevant records and images related to the planned course of therapy were reviewed. The patient freely provided informed written consent to proceed with treatment after reviewing the details related to the planned course of therapy. The consent form was witnessed and verified by the simulation staff. Then, the patient was set-up in a stable reproducible supine position for radiation therapy. Pelvic exam revealed the vaginal cuff to be intact . The patient's custom vaginal cylinder was placed in the proximal vagina. This was affixed to the CT/MR stabilization plate to prevent slippage. Patient tolerated the placement well.  Verification simulation note:  A fiducial marker was placed within the vaginal cylinder. An AP and lateral film was then obtained through the pelvis area. This documented accurate position of the vaginal cylinder for treatment.  HDR BRACHYTHERAPY TREATMENT  The remote afterloading device was affixed to the vaginal cylinder by catheter. Patient then proceeded to undergo her fifth high-dose-rate treatment directed at the proximal vagina. The patient was prescribed a dose of 6.0 gray to be delivered to the mucosal surface. Treatment length was 3.0 cm. Patient was treated with 1 channel using 7 dwell positions. Treatment time was 280.4 seconds. Iridium 192 was the high-dose-rate source for  treatment. The patient tolerated the treatment well. After completion of her therapy, a radiation survey was performed documenting return of the iridium source into the GammaMed safe.   PLAN: She has completed HDR treatment. Follow-up in radiation oncology in 1 month. ________________________________  Blair Promise, PhD, MD  This document serves as a record of services personally performed by Gery Pray, MD. It was created on his behalf by Mary-Margaret Loma Messing, a trained medical scribe. The creation of this record is based on the scribe's personal observations and the provider's statements to them. This document has been checked and approved by the attending provider.

## 2019-05-20 DIAGNOSIS — Z4800 Encounter for change or removal of nonsurgical wound dressing: Secondary | ICD-10-CM | POA: Diagnosis not present

## 2019-05-20 DIAGNOSIS — L03116 Cellulitis of left lower limb: Secondary | ICD-10-CM | POA: Diagnosis not present

## 2019-05-20 DIAGNOSIS — I10 Essential (primary) hypertension: Secondary | ICD-10-CM | POA: Diagnosis not present

## 2019-05-20 DIAGNOSIS — Z7982 Long term (current) use of aspirin: Secondary | ICD-10-CM | POA: Diagnosis not present

## 2019-05-20 DIAGNOSIS — S81802D Unspecified open wound, left lower leg, subsequent encounter: Secondary | ICD-10-CM | POA: Diagnosis not present

## 2019-05-20 DIAGNOSIS — F0390 Unspecified dementia without behavioral disturbance: Secondary | ICD-10-CM | POA: Diagnosis not present

## 2019-05-21 DIAGNOSIS — B0230 Zoster ocular disease, unspecified: Secondary | ICD-10-CM | POA: Diagnosis not present

## 2019-05-22 DIAGNOSIS — Z7982 Long term (current) use of aspirin: Secondary | ICD-10-CM | POA: Diagnosis not present

## 2019-05-22 DIAGNOSIS — I10 Essential (primary) hypertension: Secondary | ICD-10-CM | POA: Diagnosis not present

## 2019-05-22 DIAGNOSIS — Z4800 Encounter for change or removal of nonsurgical wound dressing: Secondary | ICD-10-CM | POA: Diagnosis not present

## 2019-05-22 DIAGNOSIS — S81802D Unspecified open wound, left lower leg, subsequent encounter: Secondary | ICD-10-CM | POA: Diagnosis not present

## 2019-05-22 DIAGNOSIS — L03116 Cellulitis of left lower limb: Secondary | ICD-10-CM | POA: Diagnosis not present

## 2019-05-22 DIAGNOSIS — F0390 Unspecified dementia without behavioral disturbance: Secondary | ICD-10-CM | POA: Diagnosis not present

## 2019-05-24 DIAGNOSIS — Z4800 Encounter for change or removal of nonsurgical wound dressing: Secondary | ICD-10-CM | POA: Diagnosis not present

## 2019-05-24 DIAGNOSIS — F0390 Unspecified dementia without behavioral disturbance: Secondary | ICD-10-CM | POA: Diagnosis not present

## 2019-05-24 DIAGNOSIS — S81802D Unspecified open wound, left lower leg, subsequent encounter: Secondary | ICD-10-CM | POA: Diagnosis not present

## 2019-05-24 DIAGNOSIS — I10 Essential (primary) hypertension: Secondary | ICD-10-CM | POA: Diagnosis not present

## 2019-05-24 DIAGNOSIS — Z7982 Long term (current) use of aspirin: Secondary | ICD-10-CM | POA: Diagnosis not present

## 2019-05-24 DIAGNOSIS — L03116 Cellulitis of left lower limb: Secondary | ICD-10-CM | POA: Diagnosis not present

## 2019-05-26 DIAGNOSIS — Z7982 Long term (current) use of aspirin: Secondary | ICD-10-CM | POA: Diagnosis not present

## 2019-05-26 DIAGNOSIS — I10 Essential (primary) hypertension: Secondary | ICD-10-CM | POA: Diagnosis not present

## 2019-05-26 DIAGNOSIS — L03116 Cellulitis of left lower limb: Secondary | ICD-10-CM | POA: Diagnosis not present

## 2019-05-26 DIAGNOSIS — S81802D Unspecified open wound, left lower leg, subsequent encounter: Secondary | ICD-10-CM | POA: Diagnosis not present

## 2019-05-26 DIAGNOSIS — Z4800 Encounter for change or removal of nonsurgical wound dressing: Secondary | ICD-10-CM | POA: Diagnosis not present

## 2019-05-26 DIAGNOSIS — F0390 Unspecified dementia without behavioral disturbance: Secondary | ICD-10-CM | POA: Diagnosis not present

## 2019-05-28 ENCOUNTER — Encounter (HOSPITAL_BASED_OUTPATIENT_CLINIC_OR_DEPARTMENT_OTHER): Payer: Medicare Other | Attending: Internal Medicine

## 2019-05-28 DIAGNOSIS — S81802A Unspecified open wound, left lower leg, initial encounter: Secondary | ICD-10-CM | POA: Diagnosis not present

## 2019-05-28 DIAGNOSIS — F0390 Unspecified dementia without behavioral disturbance: Secondary | ICD-10-CM | POA: Insufficient documentation

## 2019-05-28 DIAGNOSIS — Z923 Personal history of irradiation: Secondary | ICD-10-CM | POA: Diagnosis not present

## 2019-05-28 DIAGNOSIS — L97222 Non-pressure chronic ulcer of left calf with fat layer exposed: Secondary | ICD-10-CM | POA: Diagnosis not present

## 2019-05-28 DIAGNOSIS — I890 Lymphedema, not elsewhere classified: Secondary | ICD-10-CM | POA: Insufficient documentation

## 2019-05-28 DIAGNOSIS — I10 Essential (primary) hypertension: Secondary | ICD-10-CM | POA: Insufficient documentation

## 2019-05-31 DIAGNOSIS — I10 Essential (primary) hypertension: Secondary | ICD-10-CM | POA: Diagnosis not present

## 2019-05-31 DIAGNOSIS — F0390 Unspecified dementia without behavioral disturbance: Secondary | ICD-10-CM | POA: Diagnosis not present

## 2019-05-31 DIAGNOSIS — Z4800 Encounter for change or removal of nonsurgical wound dressing: Secondary | ICD-10-CM | POA: Diagnosis not present

## 2019-05-31 DIAGNOSIS — L03116 Cellulitis of left lower limb: Secondary | ICD-10-CM | POA: Diagnosis not present

## 2019-05-31 DIAGNOSIS — Z7982 Long term (current) use of aspirin: Secondary | ICD-10-CM | POA: Diagnosis not present

## 2019-05-31 DIAGNOSIS — S81802D Unspecified open wound, left lower leg, subsequent encounter: Secondary | ICD-10-CM | POA: Diagnosis not present

## 2019-06-01 ENCOUNTER — Other Ambulatory Visit: Payer: Self-pay

## 2019-06-01 DIAGNOSIS — S81802D Unspecified open wound, left lower leg, subsequent encounter: Secondary | ICD-10-CM | POA: Diagnosis not present

## 2019-06-01 DIAGNOSIS — Z4800 Encounter for change or removal of nonsurgical wound dressing: Secondary | ICD-10-CM | POA: Diagnosis not present

## 2019-06-01 DIAGNOSIS — Z20822 Contact with and (suspected) exposure to covid-19: Secondary | ICD-10-CM

## 2019-06-01 DIAGNOSIS — F0390 Unspecified dementia without behavioral disturbance: Secondary | ICD-10-CM | POA: Diagnosis not present

## 2019-06-01 DIAGNOSIS — L03116 Cellulitis of left lower limb: Secondary | ICD-10-CM | POA: Diagnosis not present

## 2019-06-01 DIAGNOSIS — Z7982 Long term (current) use of aspirin: Secondary | ICD-10-CM | POA: Diagnosis not present

## 2019-06-01 DIAGNOSIS — I10 Essential (primary) hypertension: Secondary | ICD-10-CM | POA: Diagnosis not present

## 2019-06-03 DIAGNOSIS — I10 Essential (primary) hypertension: Secondary | ICD-10-CM | POA: Diagnosis not present

## 2019-06-03 DIAGNOSIS — Z4800 Encounter for change or removal of nonsurgical wound dressing: Secondary | ICD-10-CM | POA: Diagnosis not present

## 2019-06-03 DIAGNOSIS — L03116 Cellulitis of left lower limb: Secondary | ICD-10-CM | POA: Diagnosis not present

## 2019-06-03 DIAGNOSIS — S81802D Unspecified open wound, left lower leg, subsequent encounter: Secondary | ICD-10-CM | POA: Diagnosis not present

## 2019-06-03 DIAGNOSIS — Z7982 Long term (current) use of aspirin: Secondary | ICD-10-CM | POA: Diagnosis not present

## 2019-06-03 DIAGNOSIS — F0390 Unspecified dementia without behavioral disturbance: Secondary | ICD-10-CM | POA: Diagnosis not present

## 2019-06-04 LAB — NOVEL CORONAVIRUS, NAA: SARS-CoV-2, NAA: NOT DETECTED

## 2019-06-07 DIAGNOSIS — Z7982 Long term (current) use of aspirin: Secondary | ICD-10-CM | POA: Diagnosis not present

## 2019-06-07 DIAGNOSIS — I10 Essential (primary) hypertension: Secondary | ICD-10-CM | POA: Diagnosis not present

## 2019-06-07 DIAGNOSIS — F0390 Unspecified dementia without behavioral disturbance: Secondary | ICD-10-CM | POA: Diagnosis not present

## 2019-06-07 DIAGNOSIS — Z4800 Encounter for change or removal of nonsurgical wound dressing: Secondary | ICD-10-CM | POA: Diagnosis not present

## 2019-06-07 DIAGNOSIS — L03116 Cellulitis of left lower limb: Secondary | ICD-10-CM | POA: Diagnosis not present

## 2019-06-07 DIAGNOSIS — S81802D Unspecified open wound, left lower leg, subsequent encounter: Secondary | ICD-10-CM | POA: Diagnosis not present

## 2019-06-10 DIAGNOSIS — T8189XA Other complications of procedures, not elsewhere classified, initial encounter: Secondary | ICD-10-CM | POA: Diagnosis not present

## 2019-06-10 DIAGNOSIS — I10 Essential (primary) hypertension: Secondary | ICD-10-CM | POA: Diagnosis not present

## 2019-06-10 DIAGNOSIS — L97222 Non-pressure chronic ulcer of left calf with fat layer exposed: Secondary | ICD-10-CM | POA: Diagnosis not present

## 2019-06-10 DIAGNOSIS — Z923 Personal history of irradiation: Secondary | ICD-10-CM | POA: Diagnosis not present

## 2019-06-10 DIAGNOSIS — I890 Lymphedema, not elsewhere classified: Secondary | ICD-10-CM | POA: Diagnosis not present

## 2019-06-10 DIAGNOSIS — F0390 Unspecified dementia without behavioral disturbance: Secondary | ICD-10-CM | POA: Diagnosis not present

## 2019-06-14 DIAGNOSIS — I739 Peripheral vascular disease, unspecified: Secondary | ICD-10-CM | POA: Diagnosis not present

## 2019-06-14 DIAGNOSIS — I10 Essential (primary) hypertension: Secondary | ICD-10-CM | POA: Diagnosis not present

## 2019-06-14 DIAGNOSIS — F0390 Unspecified dementia without behavioral disturbance: Secondary | ICD-10-CM | POA: Diagnosis not present

## 2019-06-14 DIAGNOSIS — L03116 Cellulitis of left lower limb: Secondary | ICD-10-CM | POA: Diagnosis not present

## 2019-06-14 DIAGNOSIS — B351 Tinea unguium: Secondary | ICD-10-CM | POA: Diagnosis not present

## 2019-06-14 DIAGNOSIS — Z7982 Long term (current) use of aspirin: Secondary | ICD-10-CM | POA: Diagnosis not present

## 2019-06-14 DIAGNOSIS — M79674 Pain in right toe(s): Secondary | ICD-10-CM | POA: Diagnosis not present

## 2019-06-14 DIAGNOSIS — Z4800 Encounter for change or removal of nonsurgical wound dressing: Secondary | ICD-10-CM | POA: Diagnosis not present

## 2019-06-14 DIAGNOSIS — S81802D Unspecified open wound, left lower leg, subsequent encounter: Secondary | ICD-10-CM | POA: Diagnosis not present

## 2019-06-14 DIAGNOSIS — M79675 Pain in left toe(s): Secondary | ICD-10-CM | POA: Diagnosis not present

## 2019-06-16 IMAGING — US US EXTREM LOW VENOUS*R*
1 series · 13 of 24 positions shown · non-contrast
Comparison: None.

CLINICAL DATA: Right lower extremity swelling



[Series 1: us extrem low venous*right* · 13 of 33 slices shown]
[im 1/33]
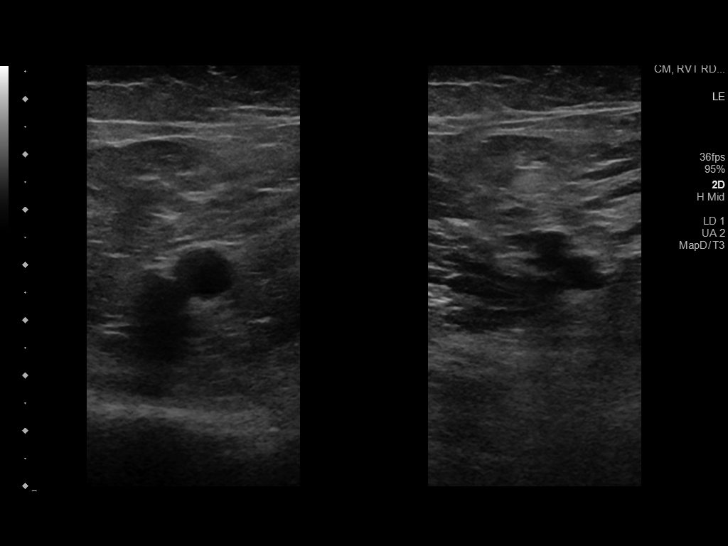
[im 3/33]
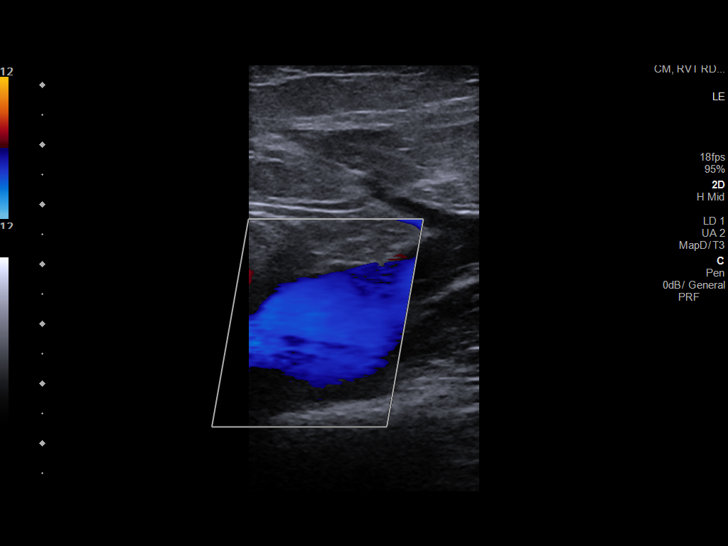
[im 6/33]
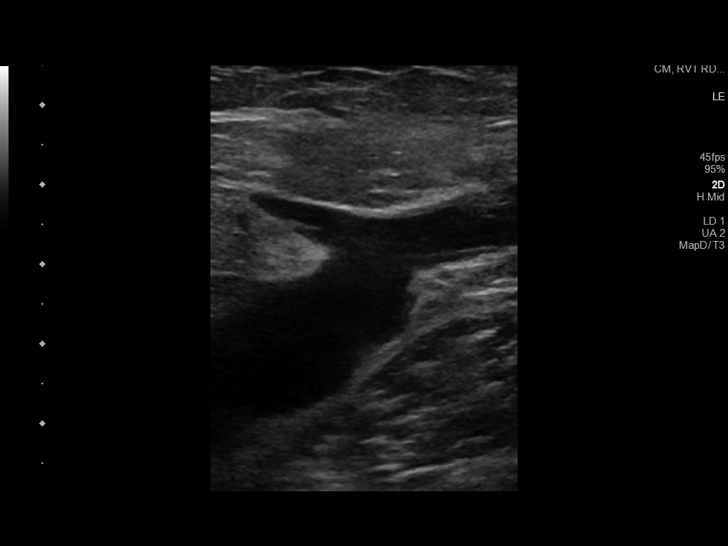
[im 9/33]
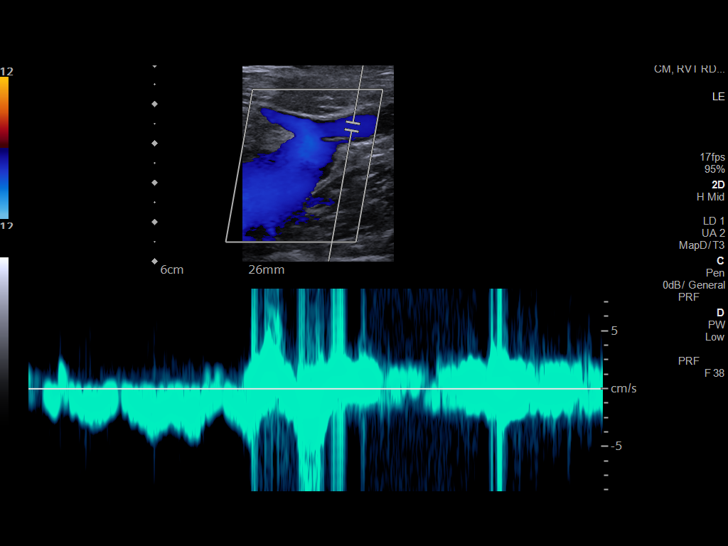
[im 12/33]
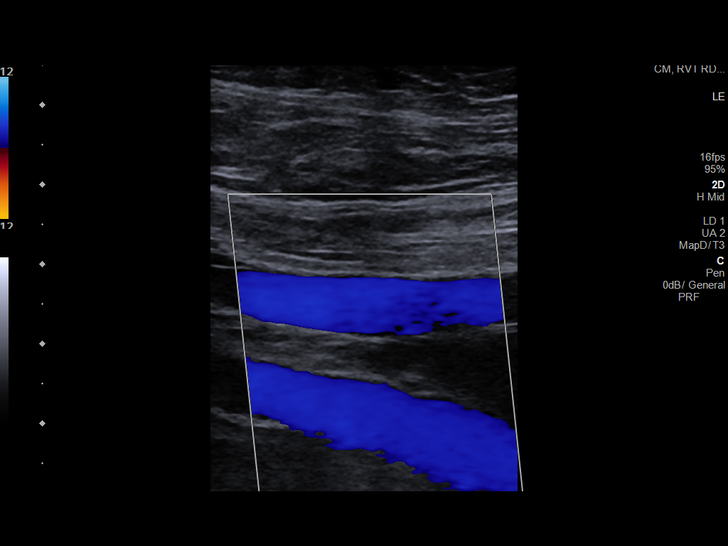
[im 14/33]
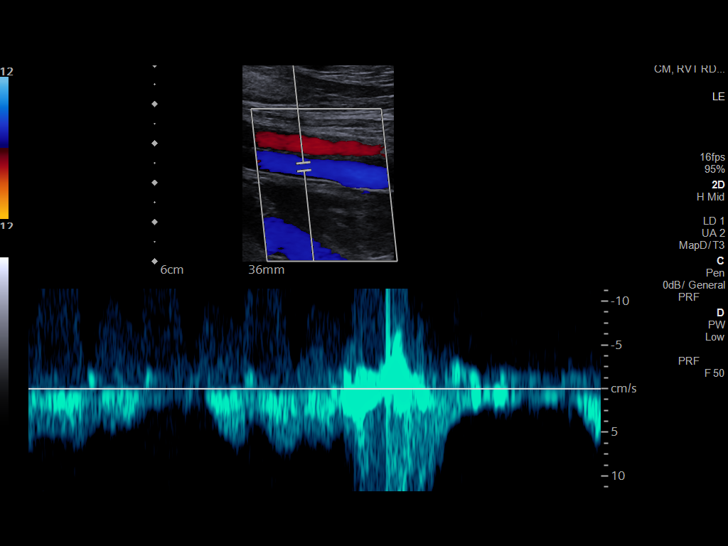
[im 17/33]
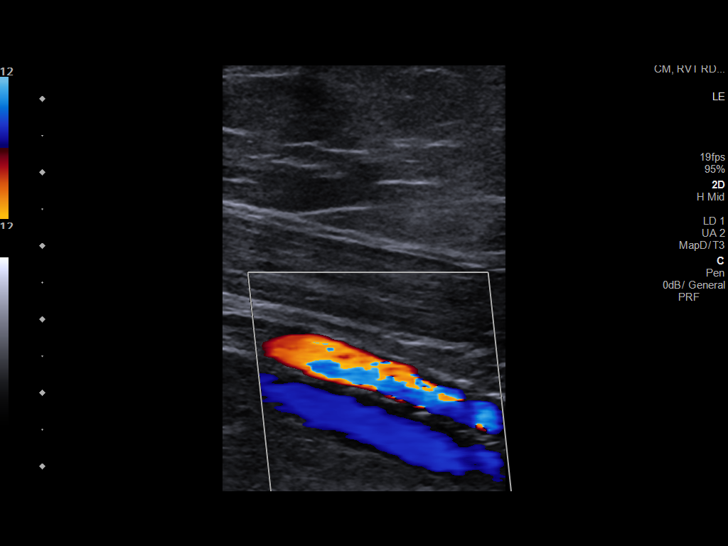
[im 19/33]
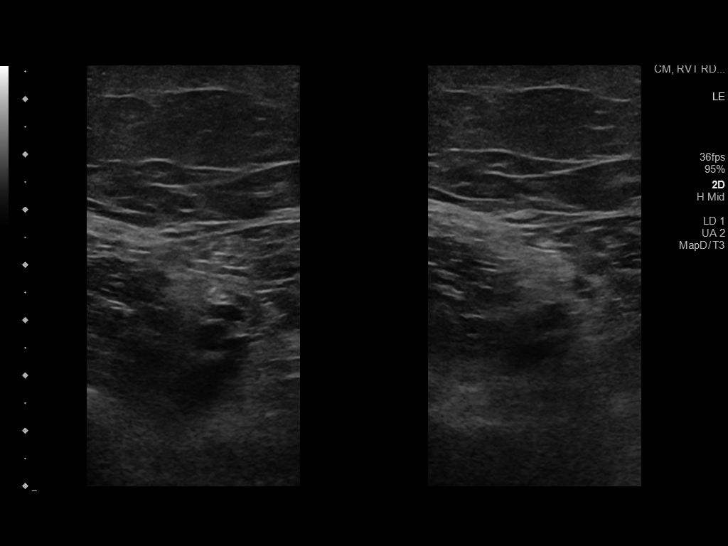
[im 21/33]
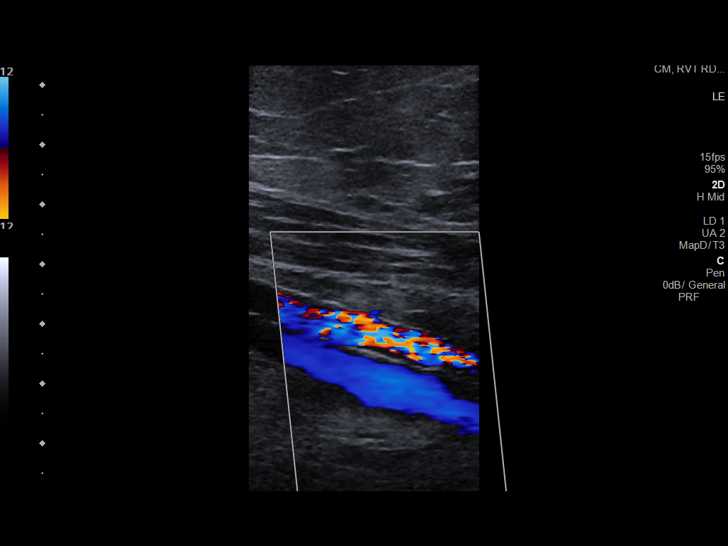
[im 24/33]
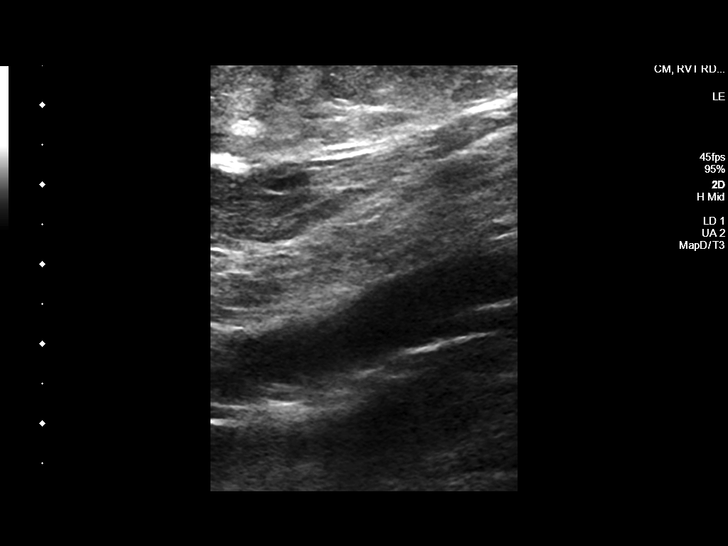
[im 27/33]
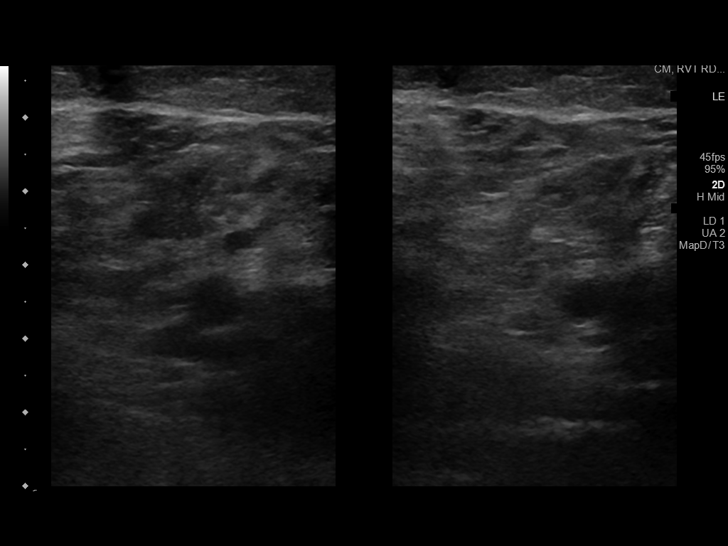
[im 30/33]
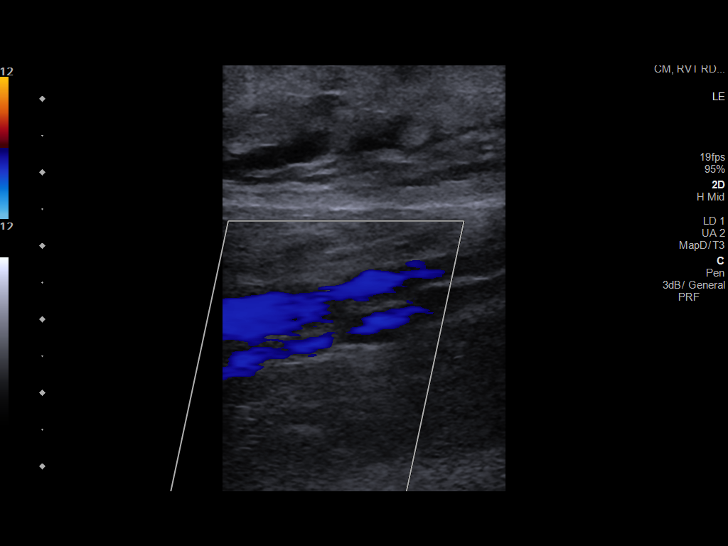
[im 33/33]
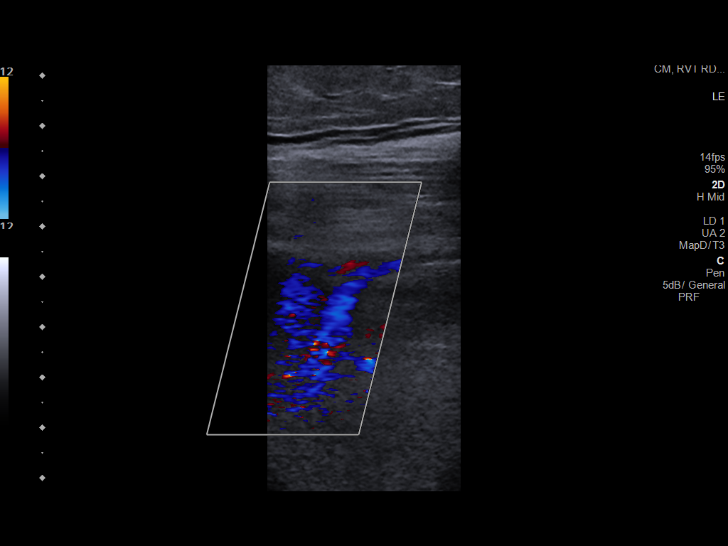

[13 of 24 positions shown; findings below may reference images not displayed]

FINDINGS: Contralateral Common Femoral Vein: Respiratory phasicity is normal
and symmetric with the symptomatic side. No evidence of thrombus.
Normal compressibility.

Common Femoral Vein: No evidence of thrombus. Normal
compressibility, respiratory phasicity and response to augmentation.

Saphenofemoral Junction: No evidence of thrombus. Normal
compressibility and flow on color Doppler imaging.

Profunda Femoral Vein: No evidence of thrombus. Normal
compressibility and flow on color Doppler imaging.

Femoral Vein: No evidence of thrombus. Normal compressibility,
respiratory phasicity and response to augmentation.

Popliteal Vein: No evidence of thrombus. Normal compressibility,
respiratory phasicity and response to augmentation.

Calf Veins: No evidence of thrombus. Normal compressibility and flow
on color Doppler imaging.

Superficial Great Saphenous Vein: No evidence of thrombus. Normal
compressibility.

Venous Reflux:  None.

Other Findings:  Subcutaneous edema evident
IMPRESSION: No evidence of deep venous thrombosis.

## 2019-06-17 DIAGNOSIS — Z7982 Long term (current) use of aspirin: Secondary | ICD-10-CM | POA: Diagnosis not present

## 2019-06-17 DIAGNOSIS — L03116 Cellulitis of left lower limb: Secondary | ICD-10-CM | POA: Diagnosis not present

## 2019-06-17 DIAGNOSIS — I10 Essential (primary) hypertension: Secondary | ICD-10-CM | POA: Diagnosis not present

## 2019-06-17 DIAGNOSIS — F0390 Unspecified dementia without behavioral disturbance: Secondary | ICD-10-CM | POA: Diagnosis not present

## 2019-06-17 DIAGNOSIS — S81802D Unspecified open wound, left lower leg, subsequent encounter: Secondary | ICD-10-CM | POA: Diagnosis not present

## 2019-06-17 DIAGNOSIS — Z4800 Encounter for change or removal of nonsurgical wound dressing: Secondary | ICD-10-CM | POA: Diagnosis not present

## 2019-06-21 ENCOUNTER — Other Ambulatory Visit: Payer: Self-pay

## 2019-06-21 ENCOUNTER — Ambulatory Visit
Admission: RE | Admit: 2019-06-21 | Discharge: 2019-06-21 | Disposition: A | Discharge status: Home / Self Care | Payer: Medicare Other | Source: Ambulatory Visit | Attending: Radiation Oncology | Admitting: Radiation Oncology

## 2019-06-21 ENCOUNTER — Encounter: Payer: Self-pay | Admitting: Radiation Oncology

## 2019-06-21 VITALS — BP 140/66 | HR 66 | Temp 98.9°F | Resp 18 | Ht 62.0 in | Wt 227.1 lb

## 2019-06-21 DIAGNOSIS — Z923 Personal history of irradiation: Secondary | ICD-10-CM | POA: Insufficient documentation

## 2019-06-21 DIAGNOSIS — Z4800 Encounter for change or removal of nonsurgical wound dressing: Secondary | ICD-10-CM | POA: Diagnosis not present

## 2019-06-21 DIAGNOSIS — Z8542 Personal history of malignant neoplasm of other parts of uterus: Secondary | ICD-10-CM | POA: Diagnosis not present

## 2019-06-21 DIAGNOSIS — I10 Essential (primary) hypertension: Secondary | ICD-10-CM | POA: Diagnosis not present

## 2019-06-21 DIAGNOSIS — Z7982 Long term (current) use of aspirin: Secondary | ICD-10-CM | POA: Diagnosis not present

## 2019-06-21 DIAGNOSIS — Z79899 Other long term (current) drug therapy: Secondary | ICD-10-CM | POA: Insufficient documentation

## 2019-06-21 DIAGNOSIS — S81802D Unspecified open wound, left lower leg, subsequent encounter: Secondary | ICD-10-CM | POA: Diagnosis not present

## 2019-06-21 DIAGNOSIS — F0390 Unspecified dementia without behavioral disturbance: Secondary | ICD-10-CM | POA: Diagnosis not present

## 2019-06-21 DIAGNOSIS — L03116 Cellulitis of left lower limb: Secondary | ICD-10-CM | POA: Diagnosis not present

## 2019-06-21 DIAGNOSIS — C541 Malignant neoplasm of endometrium: Secondary | ICD-10-CM

## 2019-06-21 DIAGNOSIS — Z08 Encounter for follow-up examination after completed treatment for malignant neoplasm: Secondary | ICD-10-CM | POA: Diagnosis not present

## 2019-06-21 NOTE — Progress Notes (Signed)
Radiation Oncology         (336) [phone] ________________________________  Name: Judith Schroeder MRN: [id number]  Date: 06/21/2019  DOB: [birth year]  Follow-Up Visit Note  CC: [name], MD  [name], MD    ICD-10-CM   1. Endometrial cancer ([place])  C54.1     Diagnosis:   Endometrial cancer ([place]). stage1B grade 2endometrial cancer    Interval Since Last Radiation:  1 months  Radiation treatment dates: 04/21/19, 04/26/19, 05/03/19, 05/12/19, 05/19/19  Site/dose:  Vagina, 6 Gy in 5 fractions for a total dose of 30 Gy  Narrative:  The patient returns today for routine follow-up.  she is doing well overall. She is accompanied by her caregiver.   On review of systems, she reports no vaginal bleeding.. she denies dysuria or bowel complaints.  The patient is accompanied by caregiver who says she overall tolerated the treatments quite well and any other symptoms. Pertinent positives are listed and detailed within the above HPI.                 ALLERGIES:  is allergic to atorvastatin.  Meds: Current Outpatient Medications  Medication Sig Dispense Refill  . aspirin EC 81 MG tablet Take 81 mg by mouth daily.     . cholecalciferol (VITAMIN D3) 25 MCG (1000 UT) tablet Take 1,000 Units by mouth every morning.    . donepezil (ARICEPT) 10 MG tablet Take 1 tablet (10 mg total) by mouth at bedtime. 90 tablet 4  . eplerenone (INSPRA) 25 MG tablet Take 25 mg by mouth every morning.     . furosemide (LASIX) 20 MG tablet Take 20 mg by mouth daily.    Marland Kitchen LORazepam (ATIVAN) 0.5 MG tablet 1 tab every 8 hours as needed for agitation/anxiety that cannot be redirected. Watch for sedation and risk of falls. (Patient taking differently: Take 0.5 mg by mouth every 8 (eight) hours as needed for anxiety. May take 1 tab every 8 hours as needed for agitation/anxiety that cannot be redirected. Watch for sedation and risk of falls.) 30 tablet 4  . magnesium oxide (MAG-OX) 400 MG tablet Take 400 mg by mouth at bedtime.     . memantine (NAMENDA) 10 MG tablet TAKE (1) TABLET BY MOUTH TWICE DAILY. (Patient taking differently: Take 10 mg by mouth 2 (two) times daily. ) 60 tablet 11  . metoprolol succinate (TOPROL-XL) 25 MG 24 hr tablet Take 25 mg by mouth every morning.     . prednisoLONE acetate (PRED FORTE) 1 % ophthalmic suspension Place 1 drop into the left eye daily.   0  . rosuvastatin (CRESTOR) 5 MG tablet Take 5 mg by mouth every morning.     . senna-docusate (SENOKOT-S) 8.6-50 MG tablet Take 2 tablets by mouth at bedtime. 30 tablet 1  . sertraline (ZOLOFT) 25 MG tablet Take 25 mg by mouth every morning.     . valACYclovir (VALTREX) 1000 MG tablet Take 1 g by mouth every morning.  4   No current facility-administered medications for this encounter.     Physical Findings: The patient is in no acute distress. Patient is alert and oriented.  height is 5\' 2"  (1.575 m) and weight is 227 lb 2 oz (103 kg). Her temporal temperature is 98.9 F (37.2 C). Her blood pressure is 140/66 and her pulse is 66. Her respiration is 18 and oxygen saturation is 99%. .  No significant changes. Lungs are clear to auscultation bilaterally. Heart has regular rate and rhythm. No  palpable cervical, supraclavicular, or axillary adenopathy. Abdomen soft, non-tender, normal bowel sounds.  Pelvic exam not performed in light of recent completion of treatment  Lab Findings: Lab Results  Component Value Date   WBC 9.0 02/19/2019   HGB 14.5 02/19/2019   HCT 45.8 02/19/2019   MCV 104.8 (H) 02/19/2019   PLT 245 02/19/2019    Radiographic Findings: No results found.  Impression:  The patient is recovering from the effects of radiation.  No evidence of recurrence on clinical exam today.  She tolerated the vaginal brachii therapy quite well without any significant side effects or lasting effects.  Plan: Patient will follow-up in radiation oncology in 5 months.  She will see Dr. Denman George in 2 months for patient's first pelvic exam after  completion of her vaginal brachii therapy.  Today the patient along with her caregiver present was given a vaginal dilator and instructions on its use.  ____________________________________   Blair Promise, PhD, MD    This document serves as a record of services personally performed by Gery Pray, MD. It was created on his behalf by Mary-Margaret Loma Messing, a trained medical scribe. The creation of this record is based on the scribe's personal observations and the provider's statements to them. This document has been checked and approved by the attending provider.

## 2019-06-21 NOTE — Progress Notes (Signed)
Pt presents today for f/u with Dr. Sondra Come. Pt is accompanied by caregiver to facilitate vaginal dilator teaching. Pt with PMH dementia.  Pt denies dysuria/hematuria. Pt denies vaginal bleeding/discharge. Pt denies rectal bleeding, diarrhea/constipation. Pt with chronic BLE swelling and does have wound on calf of LLE, followed by wound care.   BP 140/66 (BP Location: Left Arm, Patient Position: Sitting)   Pulse 66   Temp 98.9 F (37.2 C) (Temporal)   Resp 18   Ht 5\' 2"  (1.575 m)   Wt 227 lb 2 oz (103 kg)   SpO2 99%   BMI 41.54 kg/m   Wt Readings from Last 3 Encounters:  06/21/19 227 lb 2 oz (103 kg)  04/21/19 227 lb 3.2 oz (103.1 kg)  04/12/19 230 lb 4 oz (104.4 kg)   Loma Sousa, RN BSN     Home Care Instructions for the Insertion and Care of Your Vaginal Dilator  Why Do I Need a Vaginal Dilator?  Internal radiation therapy may cause scar tissue to form at the top of your vagina (vaginal cuff).  This may make vaginal examinations difficult in the future. You can prevent scar tissue from forming by using a vaginal dilator (a smooth plastic rod), and/or by having regular sexual intercourse.  If not using the dilator you should be having intercourse two or three times a week.  If you are unable to have intercourse, you should use your vaginal dilator.  You may have some spotting or bleeding from your dilator or intercourse the first few times. You may also have some discomfort. If discomfort occurs with intercourse, you and your partner may need to stop for a while and try again later.  How to Use Your Vaginal Dilator  - Wash the dilator with soap and water before and after each use. - Check the dilator to be sure it is smooth. Do not use the dilator if you find any roughspots. - Coat the dilator with K-Y Jelly, Astroglide, or Replens. Do not use Vaseline, baby oil, or other oil based lubricants. They are not water-soluble and can be irritating to the tissues in the  vagina. - Lie on your back with your knees bent and legs apart. - Insert the rounded end of the dilator into your vagina as far as it will go without causing pain or discomfort. - Close your knees and slowly straighten your legs. - Keep the dilator in your vagina for about 10 to 15 minutes.  Please use 3 times a week, for example: Monday, Wednesday and Friday evenings. [facility] your knees, open your legs, and gently remove the dilator. - Gently cleanse the skin around the vaginal opening. - Wash the dilator after each use. -  It is important that you use the dilator routinely until instructed otherwise by your doctor.   Loma Sousa, RN BSN

## 2019-06-21 NOTE — Patient Instructions (Signed)
Home Care Instructions for the Insertion and Care of Your Vaginal Dilator  Why Do I Need a Vaginal Dilator?  Internal radiation therapy may cause scar tissue to form at the top of your vagina (vaginal cuff).  This may make vaginal examinations difficult in the future. You can prevent scar tissue from forming by using a vaginal dilator (a smooth plastic rod), and/or by having regular sexual intercourse.  If not using the dilator you should be having intercourse two or three times a week.  If you are unable to have intercourse, you should use your vaginal dilator.  You may have some spotting or bleeding from your dilator or intercourse the first few times. You may also have some discomfort. If discomfort occurs with intercourse, you and your partner may need to stop for a while and try again later.  How to Use Your Vaginal Dilator  - Wash the dilator with soap and water before and after each use. - Check the dilator to be sure it is smooth. Do not use the dilator if you find any roughspots. - Coat the dilator with K-Y Jelly, Astroglide, or Replens. Do not use Vaseline, baby oil, or other oil based lubricants. They are not water-soluble and can be irritating to the tissues in the vagina. - Lie on your back with your knees bent and legs apart. - Insert the rounded end of the dilator into your vagina as far as it will go without causing pain or discomfort. - Close your knees and slowly straighten your legs. - Keep the dilator in your vagina for about 10 to 15 minutes.  Please use 3 times a week, for example: Monday, Wednesday and Friday evenings. - Bend your knees, open your legs, and gently remove the dilator. - Gently cleanse the skin around the vaginal opening. - Wash the dilator after each use. -  It is important that you use the dilator routinely until instructed otherwise by your doctor.   Coronavirus (COVID-19) Are you at risk?  Are you at risk for the Coronavirus  (COVID-19)?  To be considered HIGH RISK for Coronavirus (COVID-19), you have to meet the following criteria:  . Traveled to [place], [place], [place], [place] or [place]; or in the [place] to [place], [place], [place], or [place]; and have fever, cough, and shortness of breath within the last 2 weeks of travel OR . Been in close contact with a person diagnosed with COVID-19 within the last 2 weeks and have fever, cough, and shortness of breath . IF YOU DO NOT MEET THESE CRITERIA, YOU ARE CONSIDERED LOW RISK FOR COVID-19.  What to do if you are HIGH RISK for COVID-19?  . If you are having a medical emergency, call 911. . Seek medical care right away. Before you go to a doctor's office, urgent care or emergency department, call ahead and tell them about your recent travel, contact with someone diagnosed with COVID-19, and your symptoms. You should receive instructions from your physician's office regarding next steps of care.  . When you arrive at healthcare provider, tell the healthcare staff immediately you have returned from visiting [place], [place], [place], [place] or [place]; or traveled in the [place] to [place], [place], [place], or [place]; in the last two weeks or you have been in close contact with a person diagnosed with COVID-19 in the last 2 weeks.   . Tell the health care staff about your symptoms: fever, cough and shortness of breath. .   After you have been seen by a medical provider, you will be either: o Tested for (COVID-19) and discharged home on quarantine except to seek medical care if symptoms worsen, and asked to  - Stay home and avoid contact with others until you get your results (4-5 days)  - Avoid travel on public transportation if possible (such as bus, train, or airplane) or o Sent to the Emergency Department by EMS for evaluation, COVID-19 testing, and possible admission depending on your condition and test results.  What to do if you are LOW  RISK for COVID-19?  Reduce your risk of any infection by using the same precautions used for avoiding the common cold or flu:  . Wash your hands often with soap and warm water for at least 20 seconds.  If soap and water are not readily available, use an alcohol-based hand sanitizer with at least 60% alcohol.  . If coughing or sneezing, cover your mouth and nose by coughing or sneezing into the elbow areas of your shirt or coat, into a tissue or into your sleeve (not your hands). . Avoid shaking hands with others and consider head nods or verbal greetings only. . Avoid touching your eyes, nose, or mouth with unwashed hands.  . Avoid close contact with people who are sick. . Avoid places or events with large numbers of people in one location, like concerts or sporting events. . Carefully consider travel plans you have or are making. . If you are planning any travel outside or inside the [place], visit the CDC's Travelers' Health webpage for the latest health notices. . If you have some symptoms but not all symptoms, continue to monitor at home and seek medical attention if your symptoms worsen. . If you are having a medical emergency, call 911.   ADDITIONAL HEALTHCARE OPTIONS FOR PATIENTS  [facility] / e-Visit: [email or web address]         [facility]: [phone]  [facility]: [phone]                   [facility]: [phone]   

## 2019-06-22 DIAGNOSIS — Z4800 Encounter for change or removal of nonsurgical wound dressing: Secondary | ICD-10-CM | POA: Diagnosis not present

## 2019-06-22 DIAGNOSIS — F0390 Unspecified dementia without behavioral disturbance: Secondary | ICD-10-CM | POA: Diagnosis not present

## 2019-06-22 DIAGNOSIS — L03116 Cellulitis of left lower limb: Secondary | ICD-10-CM | POA: Diagnosis not present

## 2019-06-22 DIAGNOSIS — I10 Essential (primary) hypertension: Secondary | ICD-10-CM | POA: Diagnosis not present

## 2019-06-22 DIAGNOSIS — S81802D Unspecified open wound, left lower leg, subsequent encounter: Secondary | ICD-10-CM | POA: Diagnosis not present

## 2019-06-22 DIAGNOSIS — Z7982 Long term (current) use of aspirin: Secondary | ICD-10-CM | POA: Diagnosis not present

## 2019-06-24 ENCOUNTER — Encounter (HOSPITAL_BASED_OUTPATIENT_CLINIC_OR_DEPARTMENT_OTHER): Payer: Medicare Other | Attending: Internal Medicine

## 2019-06-24 DIAGNOSIS — S81802A Unspecified open wound, left lower leg, initial encounter: Secondary | ICD-10-CM | POA: Diagnosis not present

## 2019-06-24 DIAGNOSIS — Z923 Personal history of irradiation: Secondary | ICD-10-CM | POA: Diagnosis not present

## 2019-06-24 DIAGNOSIS — I890 Lymphedema, not elsewhere classified: Secondary | ICD-10-CM | POA: Insufficient documentation

## 2019-06-24 DIAGNOSIS — I10 Essential (primary) hypertension: Secondary | ICD-10-CM | POA: Diagnosis not present

## 2019-06-24 DIAGNOSIS — L97222 Non-pressure chronic ulcer of left calf with fat layer exposed: Secondary | ICD-10-CM | POA: Diagnosis not present

## 2019-06-24 DIAGNOSIS — F0390 Unspecified dementia without behavioral disturbance: Secondary | ICD-10-CM | POA: Insufficient documentation

## 2019-06-28 DIAGNOSIS — S81802D Unspecified open wound, left lower leg, subsequent encounter: Secondary | ICD-10-CM | POA: Diagnosis not present

## 2019-06-28 DIAGNOSIS — L03116 Cellulitis of left lower limb: Secondary | ICD-10-CM | POA: Diagnosis not present

## 2019-06-28 DIAGNOSIS — Z4800 Encounter for change or removal of nonsurgical wound dressing: Secondary | ICD-10-CM | POA: Diagnosis not present

## 2019-06-28 DIAGNOSIS — F0390 Unspecified dementia without behavioral disturbance: Secondary | ICD-10-CM | POA: Diagnosis not present

## 2019-06-28 DIAGNOSIS — I10 Essential (primary) hypertension: Secondary | ICD-10-CM | POA: Diagnosis not present

## 2019-06-28 DIAGNOSIS — Z7982 Long term (current) use of aspirin: Secondary | ICD-10-CM | POA: Diagnosis not present

## 2019-07-01 DIAGNOSIS — S81802D Unspecified open wound, left lower leg, subsequent encounter: Secondary | ICD-10-CM | POA: Diagnosis not present

## 2019-07-01 DIAGNOSIS — Z4800 Encounter for change or removal of nonsurgical wound dressing: Secondary | ICD-10-CM | POA: Diagnosis not present

## 2019-07-01 DIAGNOSIS — I10 Essential (primary) hypertension: Secondary | ICD-10-CM | POA: Diagnosis not present

## 2019-07-01 DIAGNOSIS — Z7982 Long term (current) use of aspirin: Secondary | ICD-10-CM | POA: Diagnosis not present

## 2019-07-01 DIAGNOSIS — F0390 Unspecified dementia without behavioral disturbance: Secondary | ICD-10-CM | POA: Diagnosis not present

## 2019-07-01 DIAGNOSIS — L03116 Cellulitis of left lower limb: Secondary | ICD-10-CM | POA: Diagnosis not present

## 2019-07-02 ENCOUNTER — Telehealth: Payer: Self-pay | Admitting: *Deleted

## 2019-07-02 NOTE — Telephone Encounter (Signed)
Enid Derry from radiation called and scheduled a follow up appt for Sept. I have called and spoke with the patient daughter

## 2019-07-02 NOTE — Telephone Encounter (Signed)
CALLED PATIENT TO INFORM OF FU APPT. WITH DR. Denman George  ON 09-06-19 - ARRIVAL TIME- 1:45 PM @ [facility], LVM FOR A RETURN CALL

## 2019-07-05 DIAGNOSIS — Z7982 Long term (current) use of aspirin: Secondary | ICD-10-CM | POA: Diagnosis not present

## 2019-07-05 DIAGNOSIS — F0390 Unspecified dementia without behavioral disturbance: Secondary | ICD-10-CM | POA: Diagnosis not present

## 2019-07-05 DIAGNOSIS — I10 Essential (primary) hypertension: Secondary | ICD-10-CM | POA: Diagnosis not present

## 2019-07-05 DIAGNOSIS — Z4800 Encounter for change or removal of nonsurgical wound dressing: Secondary | ICD-10-CM | POA: Diagnosis not present

## 2019-07-05 DIAGNOSIS — S81802D Unspecified open wound, left lower leg, subsequent encounter: Secondary | ICD-10-CM | POA: Diagnosis not present

## 2019-07-05 DIAGNOSIS — L03116 Cellulitis of left lower limb: Secondary | ICD-10-CM | POA: Diagnosis not present

## 2019-07-08 DIAGNOSIS — I890 Lymphedema, not elsewhere classified: Secondary | ICD-10-CM | POA: Diagnosis not present

## 2019-07-08 DIAGNOSIS — F0390 Unspecified dementia without behavioral disturbance: Secondary | ICD-10-CM | POA: Diagnosis not present

## 2019-07-08 DIAGNOSIS — T8189XA Other complications of procedures, not elsewhere classified, initial encounter: Secondary | ICD-10-CM | POA: Diagnosis not present

## 2019-07-08 DIAGNOSIS — I10 Essential (primary) hypertension: Secondary | ICD-10-CM | POA: Diagnosis not present

## 2019-07-08 DIAGNOSIS — L97222 Non-pressure chronic ulcer of left calf with fat layer exposed: Secondary | ICD-10-CM | POA: Diagnosis not present

## 2019-07-08 DIAGNOSIS — Z923 Personal history of irradiation: Secondary | ICD-10-CM | POA: Diagnosis not present

## 2019-07-13 DIAGNOSIS — Z111 Encounter for screening for respiratory tuberculosis: Secondary | ICD-10-CM | POA: Diagnosis not present

## 2019-09-06 ENCOUNTER — Ambulatory Visit: Payer: Medicare Other | Admitting: Gynecologic Oncology

## 2019-09-17 ENCOUNTER — Inpatient Hospital Stay: Payer: Medicare Other | Attending: Gynecologic Oncology | Admitting: Gynecologic Oncology

## 2019-09-17 ENCOUNTER — Telehealth: Payer: Self-pay | Admitting: Oncology

## 2019-09-17 ENCOUNTER — Encounter: Payer: Self-pay | Admitting: Gynecologic Oncology

## 2019-09-17 ENCOUNTER — Other Ambulatory Visit: Payer: Self-pay

## 2019-09-17 VITALS — BP 131/60 | HR 75 | Ht 62.0 in | Wt 242.2 lb

## 2019-09-17 DIAGNOSIS — Z7901 Long term (current) use of anticoagulants: Secondary | ICD-10-CM | POA: Diagnosis not present

## 2019-09-17 DIAGNOSIS — Z90722 Acquired absence of ovaries, bilateral: Secondary | ICD-10-CM

## 2019-09-17 DIAGNOSIS — C541 Malignant neoplasm of endometrium: Secondary | ICD-10-CM

## 2019-09-17 DIAGNOSIS — G309 Alzheimer's disease, unspecified: Secondary | ICD-10-CM | POA: Insufficient documentation

## 2019-09-17 DIAGNOSIS — Z923 Personal history of irradiation: Secondary | ICD-10-CM | POA: Diagnosis not present

## 2019-09-17 DIAGNOSIS — Z90710 Acquired absence of both cervix and uterus: Secondary | ICD-10-CM | POA: Diagnosis not present

## 2019-09-17 DIAGNOSIS — Z952 Presence of prosthetic heart valve: Secondary | ICD-10-CM | POA: Insufficient documentation

## 2019-09-17 NOTE — Patient Instructions (Signed)
Please notify Dr Denman George at phone number [phone] if you notice vaginal bleeding, new pelvic or abdominal pains, bloating, feeling full easy, or a change in bladder or bowel function.   Please have Dr Clabe Seal office contact Dr Serita Grit office (at [phone]) after your visit in December to request an appointment with her for March, 2021.

## 2019-09-17 NOTE — Progress Notes (Signed)
Follow-up Note: Gyn-Onc  Consult was initially requested by Dr. Glo Herring for the evaluation of Judith Schroeder 73 y.o. female  CC:  Chief Complaint  Patient presents with  . endometrial cancer    Assessment/Plan:  Judith Schroeder  is a 73 y.o.  year old with dementia and a stage 1B grade 2 endometrial cancer, MSI pending.  High/intermediate risk factors for recurrence s/p brachytherapy completed June, 2020. I recommend she follow-up at 3 monthly intervals for symptom review, physical examination and pelvic examination. Pap smear is not recommended in routine endometrial cancer surveillance. After 2 years we will space these visits to every 6 months, and then annually if recurrence has not developed within 5 years.  HPI: 73year old woman who is seen in consultation at the request of Dr Glo Herring for a grade 2 endometrial cancer.  The patient has alzheimers dementia and a history of an aortic valve replacement. She reported vaginal bleeding to her daughter in January. This persisted and so she was seen by Dr Glo Herring on 01/26/19 who performed a TVUS which showed a thickened endometrium.  She then underwent a biopsy the same day. This showed grade 2 endometrioid endometrial cancer.   The patient has progressive dementia but is conversant and cooperative and able to give some history herself. She ambulates independently and is independent with ADL's.  She has reported increasing SOB on exertion recently.   She has a history of an aortic valve replacement and was on anticoagulant therapy initially but is only taking ASA now. Her cardiologist is Dr [place] at [facility].    On February 25, 2019 she underwent a robotic assisted total laparoscopic hysterectomy with bilateral salpingo-oophorectomy and sentinel lymph node biopsy.  Intraoperative findings were unremarkable.. Final pathology showed a FIGO grade 2 endometrioid adenocarcinoma measuring 4.5 cm.  Focal deep myometrial invasion was noted with 8 mm  of 11 mm total thickness.  No lymphovascular space invasion was seen.  The cervix and adnexa and sentinel lymph nodes were all negative. She was determined to have high/intermediate risk endometrial cancer and was recommended vaginal brachytherapy in accordance with NCCN guidelines.   Interval Hx:  She completed 30 Gray (6 Gy in 5 fractions) of vaginal brachii therapy beginning on March 22, 2019 and completed on May 19, 2019.  She tolerated the treatments well with no distress or concern.  She presents today for routine scheduled follow-up with no symptoms concerning for recurrent disease.  Current Meds:  Outpatient Encounter Medications as of 09/17/2019  Medication Sig  . docusate sodium (COLACE) 100 MG capsule Take 100 mg by mouth 2 (two) times daily.  . metoprolol tartrate (LOPRESSOR) 25 MG tablet Take 25 mg by mouth daily.  Marland Kitchen aspirin EC 81 MG tablet Take 81 mg by mouth daily.   . cholecalciferol (VITAMIN D3) 25 MCG (1000 UT) tablet Take 1,000 Units by mouth every morning.  . donepezil (ARICEPT) 10 MG tablet Take 1 tablet (10 mg total) by mouth at bedtime.  Marland Kitchen eplerenone (INSPRA) 25 MG tablet Take 25 mg by mouth every morning.   . furosemide (LASIX) 20 MG tablet Take 20 mg by mouth daily.  Marland Kitchen LORazepam (ATIVAN) 0.5 MG tablet 1 tab every 8 hours as needed for agitation/anxiety that cannot be redirected. Watch for sedation and risk of falls. (Patient taking differently: Take 0.5 mg by mouth every 8 (eight) hours as needed for anxiety. May take 1 tab every 8 hours as needed for agitation/anxiety that cannot be redirected. Watch for  sedation and risk of falls.)  . magnesium oxide (MAG-OX) 400 MG tablet Take 400 mg by mouth at bedtime.  . memantine (NAMENDA) 10 MG tablet TAKE (1) TABLET BY MOUTH TWICE DAILY. (Patient taking differently: Take 10 mg by mouth 2 (two) times daily. )  . prednisoLONE acetate (PRED FORTE) 1 % ophthalmic suspension Place 1 drop into the left eye daily.   . rosuvastatin  (CRESTOR) 5 MG tablet Take 5 mg by mouth every morning.   . senna-docusate (SENOKOT-S) 8.6-50 MG tablet Take 2 tablets by mouth at bedtime.  . sertraline (ZOLOFT) 25 MG tablet Take 25 mg by mouth every morning.   . valACYclovir (VALTREX) 1000 MG tablet Take 1 g by mouth every morning.  . [DISCONTINUED] metoprolol succinate (TOPROL-XL) 25 MG 24 hr tablet Take 25 mg by mouth every morning.    No facility-administered encounter medications on file as of 09/17/2019.     Allergy:  Allergies  Allergen Reactions  . Atorvastatin Other (See Comments)    Muscle pain    Social Hx:   Social History   Socioeconomic History  . Marital status: Widowed    Spouse name: Not on file  . Number of children: 1  . Years of education: 12+  . Highest education level: Not on file  Occupational History  . Occupation: Retired  Scientific laboratory technician  . Financial resource strain: Not on file  . Food insecurity    Worry: Not on file    Inability: Not on file  . Transportation needs    Medical: Not on file    Non-medical: Not on file  Tobacco Use  . Smoking status: Never Smoker  . Smokeless tobacco: Never Used  Substance and Sexual Activity  . Alcohol use: No    Alcohol/week: 0.0 standard drinks  . Drug use: No  . Sexual activity: Not Currently  Lifestyle  . Physical activity    Days per week: Not on file    Minutes per session: Not on file  . Stress: Not on file  Relationships  . Social Herbalist on phone: Not on file    Gets together: Not on file    Attends religious service: Not on file    Active member of club or organization: Not on file    Attends meetings of clubs or organizations: Not on file    Relationship status: Not on file  . Intimate partner violence    Fear of current or ex partner: Not on file    Emotionally abused: Not on file    Physically abused: Not on file    Forced sexual activity: Not on file  Other Topics Concern  . Not on file  Social History Narrative    Patient lives at home alone.    Patient is widowed.    Patient has 1 child   Patient has 2 years of college   Patient is right handed    Caffeine use: 2-3 glasses diet green tea per day   No soda     Past Surgical Hx:  Past Surgical History:  Procedure Laterality Date  . AORTIC VALVE REPLACEMENT  2012  . CARDIAC VALVE REPLACEMENT     aortic  . DILATION AND CURETTAGE OF UTERUS    . EMPYEMA DRAINAGE  2017  . LYMPH NODE BIOPSY N/A 02/25/2019   Procedure: Clement Husbands LYMPH NODE BIOPSY;  Surgeon: Everitt Amber, MD;  Location: [facility];  Service: Gynecology;  Laterality: N/A;  . ROBOTIC ASSISTED TOTAL  HYSTERECTOMY WITH BILATERAL SALPINGO OOPHERECTOMY Bilateral 02/25/2019   Procedure: XI ROBOTIC ASSISTED TOTAL HYSTERECTOMY WITH BILATERAL SALPINGO OOPHORECTOMY;  Surgeon: Everitt Amber, MD;  Location: [facility];  Service: Gynecology;  Laterality: Bilateral;  . TUBAL LIGATION    . VIDEO ASSISTED THORACOSCOPY (VATS)/EMPYEMA Right 09/07/2016   Procedure: VIDEO ASSISTED THORACOSCOPY (VATS)/EMPYEMA;  Surgeon: Gaye Pollack, MD;  Location: [facility];  Service: Thoracic;  Laterality: Right;    Past Medical Hx:  Past Medical History:  Diagnosis Date  . Cancer [facility])    endometrial cancer  . Carpal tunnel syndrome   . Dementia ([place])   . Dementia ([place]) 2018  . Depression   . H/O aortic valve replacement   . High blood pressure   . High cholesterol   . Pleural effusion   . Shingles 2019   L eye    Past Gynecological History:  SVD x 1 No LMP recorded. Patient is postmenopausal.  Family Hx:  Family History  Problem Relation Age of Onset  . Hypertension Mother   . Cancer Father        head and neck   . Dementia Sister     Review of Systems:  Constitutional  Feels well,    ENT Normal appearing ears and nares bilaterally Skin/Breast  No rash, sores, jaundice, itching, dryness Cardiovascular  No chest pain, shortness of breath, or edema  Pulmonary  No cough or wheeze.  Gastro Intestinal  No  nausea, vomitting, or diarrhoea. No bright red blood per rectum, no abdominal pain, change in bowel movement, or constipation.  Genito Urinary  No frequency, urgency, dysuria, no bleeding Musculo Skeletal  No myalgia, arthralgia, joint swelling or pain  Neurologic  No weakness, numbness, change in gait,  Psychology  No depression, anxiety, insomnia. + dementia  Vitals:  Blood pressure 131/60, pulse 75, height 5' 2"  (1.575 m), weight 242 lb 4 oz (109.9 kg), SpO2 97 %, peak flow (!) 20 L/min.  Physical Exam: WD in NAD Neck  Supple NROM, without any enlargements.  Lymph Node Survey No cervical supraclavicular or inguinal adenopathy Cardiovascular  Pulse normal rate, regularity and rhythm. S1 and S2 normal.  Lungs  Clear to auscultation bilateraly, without wheezes/crackles/rhonchi. Good air movement.  Skin  No rash/lesions/breakdown  Psychiatry  Alert and oriented to person, place, and time  Abdomen  Normoactive bowel sounds, abdomen soft, non-tender and obese without evidence of hernia. Incisions soft. Back No CVA tenderness Genito Urinary  Vaginal cuff healing normally Rectal  deferred Extremities  No bilateral cyanosis, clubbing or edema.  Thereasa Solo, MD  09/17/2019, 2:38 PM

## 2019-09-17 NOTE — Telephone Encounter (Signed)
Requested MSI testing with Suanne Marker in [facility].

## 2019-09-23 ENCOUNTER — Telehealth: Payer: Self-pay | Admitting: Oncology

## 2019-09-23 DIAGNOSIS — C541 Malignant neoplasm of endometrium: Secondary | ICD-10-CM

## 2019-09-23 NOTE — Telephone Encounter (Signed)
Judith Schroeder with MMR results.  Advised her that Dr. Denman George is recommending a genetic counseling referral.  [name] said she would like [name] to have genetic counseling later in the year (December/January) because it is difficult to take her from the nursing home due to Covid.  She would like a Friday appointment.

## 2019-09-27 ENCOUNTER — Ambulatory Visit: Payer: Self-pay | Admitting: Neurology

## 2019-10-04 DIAGNOSIS — Z6841 Body Mass Index (BMI) 40.0 and over, adult: Secondary | ICD-10-CM | POA: Diagnosis not present

## 2019-10-04 DIAGNOSIS — R609 Edema, unspecified: Secondary | ICD-10-CM | POA: Diagnosis not present

## 2019-10-04 DIAGNOSIS — I10 Essential (primary) hypertension: Secondary | ICD-10-CM | POA: Diagnosis not present

## 2019-10-04 DIAGNOSIS — Z952 Presence of prosthetic heart valve: Secondary | ICD-10-CM | POA: Diagnosis not present

## 2019-10-04 DIAGNOSIS — I359 Nonrheumatic aortic valve disorder, unspecified: Secondary | ICD-10-CM | POA: Diagnosis not present

## 2019-10-04 DIAGNOSIS — E782 Mixed hyperlipidemia: Secondary | ICD-10-CM | POA: Diagnosis not present

## 2019-10-04 DIAGNOSIS — E669 Obesity, unspecified: Secondary | ICD-10-CM | POA: Diagnosis not present

## 2019-11-16 ENCOUNTER — Telehealth: Payer: Self-pay | Admitting: *Deleted

## 2019-11-16 NOTE — Telephone Encounter (Signed)
The pt cancel appt for 11/22/19 because in a nursing home don't want to come in the office now, may call back the first of year.

## 2019-11-22 ENCOUNTER — Ambulatory Visit: Payer: Medicare Other | Admitting: Radiation Oncology

## 2019-12-06 ENCOUNTER — Telehealth: Payer: Self-pay | Admitting: Neurology

## 2019-12-06 NOTE — Telephone Encounter (Signed)
I called patient and LVM to r/s 12/30 appt to NP d/t MD out.

## 2019-12-15 ENCOUNTER — Ambulatory Visit: Payer: Self-pay | Admitting: Neurology

## 2020-01-27 DIAGNOSIS — R601 Generalized edema: Secondary | ICD-10-CM | POA: Diagnosis not present

## 2020-01-27 DIAGNOSIS — R635 Abnormal weight gain: Secondary | ICD-10-CM | POA: Diagnosis not present

## 2020-01-31 DIAGNOSIS — Z23 Encounter for immunization: Secondary | ICD-10-CM | POA: Diagnosis not present

## 2020-02-04 DIAGNOSIS — N1831 Chronic kidney disease, stage 3a: Secondary | ICD-10-CM | POA: Diagnosis not present

## 2020-02-04 DIAGNOSIS — R609 Edema, unspecified: Secondary | ICD-10-CM | POA: Diagnosis not present

## 2020-02-04 DIAGNOSIS — G309 Alzheimer's disease, unspecified: Secondary | ICD-10-CM | POA: Diagnosis not present

## 2020-02-09 ENCOUNTER — Ambulatory Visit (INDEPENDENT_AMBULATORY_CARE_PROVIDER_SITE_OTHER): Payer: Medicare Other | Admitting: Neurology

## 2020-02-09 ENCOUNTER — Other Ambulatory Visit: Payer: Self-pay

## 2020-02-09 ENCOUNTER — Encounter: Payer: Self-pay | Admitting: Neurology

## 2020-02-09 VITALS — BP 156/78 | HR 67 | Temp 97.0°F | Ht 62.0 in | Wt 236.0 lb

## 2020-02-09 DIAGNOSIS — F0281 Dementia in other diseases classified elsewhere with behavioral disturbance: Secondary | ICD-10-CM

## 2020-02-09 DIAGNOSIS — G301 Alzheimer's disease with late onset: Secondary | ICD-10-CM | POA: Diagnosis not present

## 2020-02-09 DIAGNOSIS — F02818 Dementia in other diseases classified elsewhere, unspecified severity, with other behavioral disturbance: Secondary | ICD-10-CM

## 2020-02-09 MED ORDER — SERTRALINE HCL 100 MG PO TABS: 100.0000 mg | ORAL_TABLET | Freq: Every day | ORAL | 6 refills | Status: DC

## 2020-02-09 NOTE — Progress Notes (Signed)
[id number]    Provider:  Dr Judith Schroeder Referring Provider: [name], MD Primary Care Physician:  Judith Noble, MD   CC: Memory loss  Interval history 02/09/2020: Patient here for follow up of dementia. She is doing very well er MMSE which today [place] 21/30 (last was 23 in 2018). She is here with her daughter. She lives with 6 other women, they have a cook and support. She is working at adjusting and trying to make mental attitude strong. She is trying to use word books, trying to exercise her mind, she is trying to read. Her best friend died this past week and the rest of the people she lives with are mode advanced than she is. Daughter thinks it is a nice place, she has been able to visit, it is smaller all ladies, a very nice house, a cook 3 meals a day, she feels like she is at home. Eating well. Affordable and close to daughter. She has a snack box, it makes it her happy. Noticeable worse, more agitated. The same conversations, the story plots are more "bizarre" not factual. She is seeing some things that are not there, like seeing a squirrel,   Interval history 09/23/2018: patient is here for a one year evaluation. At last appointment she was still functional and coherent enough  to make decisions on health and finances. It appears she has significantly declined this year. She says her memory worsened this year and she got in "very bad shape" she reports forgetting everything  Even things people just said or where is at or where she is going. She forgot to pay bills, daughter helping more this year, with medication management and financial management patient is not managing her finances or her medication herself at all. She is less sociable, she gets lots of telemarking calls and she is giving them personal information she should not. She gives them her name, DOB, SSN, patient endorses this and daughter also supports reports. She forgets what street she is on or if she ate that day.  Unclear if patient is bathing or eating. Discussed I am concerned she cannot live alone anymore and may need 24x7 monitoring. She doesn't want to go out and do things like she used to.  Also recommended no driving, she is not driving anymore. Patient says she feels safe in her home alone, I discussed they should possibly consider a memory unit. She hears noises at night. Per daughter she is telling stories that are not factual. Decline started earlier this year and has been slowly progressive more recently a little quicker and a noticeable change in the last 3 weeks. No low back pain, no new difficulty with walking, she is not as active. No neck pain. No new eakness. No fevers, no chills, no difficulty speaking or with aphasia. She is having blurry vision in the left eye, ongoing for a good while since the shingles and her pupil is affected. She had shingles and her left eye was affected her pupil is non reactive.   Interval history: Mild dementia, most likely secondary to Alzheimer'Judith disease, with behavioral disturbance. Major depressive disorder, moderate to severe. Generalized Anxiety Disorder. Discussed driving a driving evaluation yearly. Start Namenda. She is making mistakes financially. Discussed POA and HCPOA. Discussed daughter having access to all acounts.   Interval history 08/12/2017: PET CT Scan was negative. Patient reports she has to do a lot of writing down to remember things. She is writing things more.   Interval history  02/04/2017: Memory changes are stable. She is better since last appointment. Daughter is here and she is handling her medications and refills and making sure that is filled, she is eating better, she is making more lists and using calendars. She has been on top of things but facts of day to day activities have been worse, sister with stage 4 cancer but she kept saying she didn't know sister had cancer, stories she tells may not be true. She is trying to be more social.  Depression still may be playing a part. Discussed FDG PET scan as a diagnostic measure and our   Interval history 07/30/2016:Patient here for followup of memory loss. Had neurocognitive testing with Judith Schroeder in 2016. She did perform poorly but diagnostic possibilities includedcognitive dysfunction associated with depression, he noted that her cognitive difficulties first manifested coincidently withchanges in her mood and behavior suggestive of depression, that her cognitive difficulties had been occurring on an intermittent basis and that she reported subjectively improved cognitive functioning around the time she hired caregivers for her mother by reducing her stress level. However given the findings it was concerning for cerebral dysfunction. Patient was advised to follow up for depression and then come back to clinic for further evaluation   Patient returns today. She feels her depression is improved, she is playing golf, being social. Still her memory continues to decline. She has a significant FHx of dementia.Sister has dementia. Father has dementia. Daughter is here who provides information, says patienttells the same story multiple times. Confused. She can "zone out". She will say things that are random. Especially the last 6 months significant decline. Daughter says her depression is not worse, better despite the memory changes.    [id number] Judith Wilsonis a 74 y.o.femalehere as a follow up.  Patient here for follow up of memory loss. She was evaluated by Dr. Valentina Schroeder and this is likely due to stress and depression. Talked about the results, diagnosis, stressed life changes, therapy, psychiatry, treatment for depression. She is making changes in her life. She is caring for her mother, she has a lot of stress.   b12 and folate nml tsh nml Unremarkable MRI of the brain  Reviewed notes, labs and imaging from outside physicians, which showed: per preliminary report by Dr. Valentina Schroeder, At this  point, it is likely that her attention and memory functioning are being disrupted by ongoing stress and depression although an incipient neurodegenerative process cannot be excluded. In support of the role of mood factors was the report that her cognitive difficulties first manifested coincident with changes in her mood and behavior suggestive of depression; and that she reported subjectively improved cognitive functioning around the time she hired caregivers for her mother thereby reducing her level of life stress. In contrast, the extent of her memory deficit on neurocognitive testing in context of her consistent alertness and attentiveness, relaxed demeanor and apparent steady effort was troubling.  It is recommended that she be treated for depression with antidepressant medication, if medically appropriate. She agreed. Psychological counseling should be deferred for the time being given that she was not overly interested in pursuing counseling at this point. If her cognitive difficulties should persist after improvement in mood, then a repeat neuropsychological evaluation should be considered, perhaps in one year.   11/23/2014 initiial visit: Judith Schroeder is a 74 y.o. female here as a referral from Dr. Willey Blade for cognitive changes. PMHx of HTN, HLD, Aotic valve disorder Judith/p replacement. This past summer she noticed that at times  ahe would get distracted and her mind would wander. For example, even playing golf she would get distracted and think of a tax return. She is under a lot of stress, mother is 78 and calling her every day. She loses things, was going to bring a folder today and couldn't find it. She is forgetting appointments, she has to keep them documented. Lives alone. Independent. She pays bills on time, she keeps good records. She is paying her mother'Judith bills. She can't spell, this is chronic. She is losing more recent memory, her memory "goes for a split second" and she has to think hard to  remember what she was going into a room to do or where she put something. She thinks about something else and her mind switches over. No confusion or lapses of time or episodes of altered consciousness. She has episodes where she has a cold feeling over her body and she shivers. Memory problems "come and go". Not getting worse. Just happening more often. Father with parkinson'Judith disease. Father with dementia. Fatigued, some days she wants to sleep all days and other days it is fine. She sleeps well. Doesn't know if she snores. She takes her fluid pills at night and gets up at night to urinate but doesn't bother her. She can't take fluid pilld during the day or she wets her pants, she can't urinate a lot like when she is playing golf. She is still social, plays golf. No focal neurologic symptoms.    Reviewed notes, labs and imaging from outside physicians, which showed: HgbA1c 5.6 cbc/cmp unremarkable  Review of Systems: Patient complains of symptoms per HPI as well as the following symptoms: agitation, confusion, memory loss, confusion, depression, nervous, hallucinations. Pertinent negatives per HPI. All others negative.    Social History   Socioeconomic History  . Marital status: Widowed    Spouse name: Not on file  . Number of children: 1  . Years of education: 12+  . Highest education level: Not on file  Occupational History  . Occupation: Retired  Tobacco Use  . Smoking status: Never Smoker  . Smokeless tobacco: Never Used  Substance and Sexual Activity  . Alcohol use: No    Alcohol/week: 0.0 standard drinks  . Drug use: No  . Sexual activity: Not Currently  Other Topics Concern  . Not on file  Social History Narrative   Patient lives in a home with 6 other ladies. Has nursing staff and a cook there.     Patient is widowed.    Patient has 1 child   Patient has 2 years of college   Patient is right handed    Caffeine use: sweet tea daily   No soda    Social Determinants  of Health   Financial Resource Strain:   . Difficulty of Paying Living Expenses: Not on file  Food Insecurity:   . Worried About Charity fundraiser in the Last Year: Not on file  . Ran Out of Food in the Last Year: Not on file  Transportation Needs:   . Lack of Transportation (Medical): Not on file  . Lack of Transportation (Non-Medical): Not on file  Physical Activity:   . Days of Exercise per Week: Not on file  . Minutes of Exercise per Session: Not on file  Stress:   . Feeling of Stress : Not on file  Social Connections:   . Frequency of Communication with Friends and Family: Not on file  . Frequency of Social Gatherings  with Friends and Family: Not on file  . Attends Religious Services: Not on file  . Active Member of Clubs or Organizations: Not on file  . Attends Archivist Meetings: Not on file  . Marital Status: Not on file  Intimate Partner Violence:   . Fear of Current or Ex-Partner: Not on file  . Emotionally Abused: Not on file  . Physically Abused: Not on file  . Sexually Abused: Not on file    Family History  Problem Relation Age of Onset  . Hypertension Mother   . Cancer Father        head and neck   . Dementia Sister     Past Medical History:  Diagnosis Date  . Cancer [facility])    endometrial cancer  . Carpal tunnel syndrome   . Dementia ([place])   . Dementia ([place]) 2018  . Depression   . H/O aortic valve replacement   . High blood pressure   . High cholesterol   . Pleural effusion   . Shingles 2019   L eye    Past Surgical History:  Procedure Laterality Date  . AORTIC VALVE REPLACEMENT  2012  . APPLICATION OF WOUND VAC Left 10/2018   leg   . CARDIAC VALVE REPLACEMENT     aortic  . DILATION AND CURETTAGE OF UTERUS    . EMPYEMA DRAINAGE  2017  . LYMPH NODE BIOPSY N/A 02/25/2019   Procedure: Clement Husbands LYMPH NODE BIOPSY;  Surgeon: Everitt Amber, MD;  Location: [facility];  Service: Gynecology;  Laterality: N/A;  . radiation for endometrial cancer     . ROBOTIC ASSISTED TOTAL HYSTERECTOMY WITH BILATERAL SALPINGO OOPHERECTOMY Bilateral 02/25/2019   Procedure: XI ROBOTIC ASSISTED TOTAL HYSTERECTOMY WITH BILATERAL SALPINGO OOPHORECTOMY;  Surgeon: Everitt Amber, MD;  Location: [facility];  Service: Gynecology;  Laterality: Bilateral;  . TUBAL LIGATION    . VIDEO ASSISTED THORACOSCOPY (VATS)/EMPYEMA Right 09/07/2016   Procedure: VIDEO ASSISTED THORACOSCOPY (VATS)/EMPYEMA;  Surgeon: Gaye Pollack, MD;  Location: [facility];  Service: Thoracic;  Laterality: Right;    Current Outpatient Medications  Medication Sig Dispense Refill  . aspirin EC 81 MG tablet Take 81 mg by mouth daily.     Marland Kitchen docusate sodium (COLACE) 100 MG capsule Take 100 mg by mouth 2 (two) times daily.    Marland Kitchen donepezil (ARICEPT) 10 MG tablet Take 1 tablet (10 mg total) by mouth at bedtime. 90 tablet 4  . eplerenone (INSPRA) 25 MG tablet Take 25 mg by mouth every morning.     . furosemide (LASIX) 20 MG tablet Take 20 mg by mouth daily. Take 1 tablet by mouth in addition to scheduled dose if weight gain of 3 lbs in 1 day and or 5 lbs/ 1 week for 3 days.    Marland Kitchen levocetirizine (XYZAL) 5 MG tablet Take 5 mg by mouth at bedtime.    Marland Kitchen LORazepam (ATIVAN) 0.5 MG tablet 1 tab every 8 hours as needed for agitation/anxiety that cannot be redirected. Watch for sedation and risk of falls. (Patient taking differently: Take 0.5 mg by mouth every 6 (six) hours as needed for anxiety. May take 1 tab every 6 hours as needed for agitation/anxiety that cannot be redirected. Watch for sedation and risk of falls.) 30 tablet 4  . magnesium oxide (MAG-OX) 400 MG tablet Take 400 mg by mouth daily.     . memantine (NAMENDA) 10 MG tablet TAKE (1) TABLET BY MOUTH TWICE DAILY. (Patient taking differently: Take 10 mg by  mouth 2 (two) times daily. ) 60 tablet 11  . metoprolol tartrate (LOPRESSOR) 25 MG tablet Take 25 mg by mouth daily.    . QUEtiapine (SEROQUEL) 25 MG tablet Take 25 mg by mouth at bedtime.    . rosuvastatin  (CRESTOR) 5 MG tablet Take 5 mg by mouth every morning.     . valACYclovir (VALTREX) 1000 MG tablet Take 1 g by mouth every morning.  4  . cholecalciferol (VITAMIN D3) 25 MCG (1000 UT) tablet Take 1,000 Units by mouth every morning.    . sertraline (ZOLOFT) 100 MG tablet Take 1 tablet (100 mg total) by mouth daily. 90 tablet 6   No current facility-administered medications for this visit.    Allergies as of 02/09/2020 - Review Complete 02/09/2020  Allergen Reaction Noted  . Atorvastatin Other (See Comments) 09/11/2015    Vitals: BP (!) 156/78 (BP Location: Right Arm, Patient Position: Sitting, Cuff Size: Large)   Pulse 67   Temp (!) 97 F (36.1 C) Comment: daughter 77; taken at front  Ht 5\' 2"  (1.575 m)   Wt 236 lb (107 kg)   BMI 43.16 kg/m  Last Weight:  Wt Readings from Last 1 Encounters:  02/09/20 236 lb (107 kg)   Last Height:   Ht Readings from Last 1 Encounters:  02/09/20 5\' 2"  (1.575 m)    Physical exam: Exam: Gen: NAD, conversant, well nourised, obese, well groomed                      Neuro: Detailed Neurologic Exam  Speech:    Speech is normal; fluent and spontaneous  Cranial Nerves:    Left pupil irregular and not reactive. Visual fields are full to finger confrontation. Extraocular movements are intact. Trigeminal sensation is intact and the muscles of mastication are normal. The face is symmetric. The palate elevates in the midline. Hearing intact. Voice is normal. Shoulder shrug is normal. The tongue has normal motion without fasciculations.   Motor Observation:    No asymmetry, no atrophy, and no involuntary movements noted. Tone:    Normal muscle tone.    Posture:    Posture is normal. normal erect    Strength:    Strength is V/V in the upper and lower limbs.       MMSE - Mini Mental State Exam 02/09/2020 09/23/2018 08/12/2017  Orientation to time 1 4 2   Orientation to Place 4 5 4   Registration 3 3 3   Attention/ Calculation 4 1 5   Recall 1 0 0    Language- name 2 objects 2 2 2   Language- repeat 1 0 1  Language- follow 3 step command 3 3 3   Language- read & follow direction 1 1 1   Write a sentence 1 1 1   Copy design 0 1 1  Total score 21 21 23       Assessment/Plan: Judith Schroeder is a 74y.o. female here as a referral from Dr. Willey Blade for Dementia, diagnosed  with mild dementia on neurocognitive testing in 2018. PMHx of HTN, HLD, Aortic valve disorder Judith/p replacement. Here with her daughter.   - Its hard to know how much patient has progressed, her MMSE has not changed a lot since 2018 and 21/30 is more mild to moderate but by report  she has more hallucinations and delusions and sundowning. Daughter would like to repeat neurocognitive testing, was completed several years ago and I think this is a good idea to help patient and daughter,  I believe patient is more advanced than she appears. Will wait for Dr. Sima Matas.   - She is on zoloft 25mg , I think we should increase to 100mg .   - ocntinue aricept,namenda,   Today'Judith history and physical demonstrated very substantial and measurable cognitive losses consistent with moderate to severe dementia. Based on the prior experiences in the community and the substantial degree of impairment it is clear that she does not have the capacity to make informed and appropriate decisions on her healthcare and finances. I do recommend that she lives in a structured setting. It is also clear that patient does not comprehend the degree of cognitive losses or the risks this poses. On this basis I feel it is necessary to appoint a formal guardian of patient'Judith person and financial affairs and someone who can continue to look out for the best interests of patient who is suffering from substantial cognitive impairment due to dementia.   Past workup:   b12 and folate nml tsh nml Unremarkable MRI of the brain FDG PET scan: Normal. Neurocognitive testing 04/2017: Mild dementia Discussed Clinical trials,  they decline at this time Discussed POA and HCPOA and assisted living Continue Aricept and Namenda No driving I am concerned about her living alone, daughter lives next door and is there often but II feel they need to consider different living situations, also discussed day centers in the area.  Cc: Dr. Cecille Aver, MD  [facility] [address] [place], [place] [address]  Phone [phone] Fax [phone] Sarina Ill, MD  [facility] [address] [place], [place] [address]  Phone [phone] Fax [phone]  A total of 30 minutes was spent face-to-face with this patient. Over half this time was spent on counseling patient on the  1. Late onset Alzheimer'Judith disease with behavioral disturbance ([place])    diagnosis and different diagnostic and therapeutic options available.

## 2020-02-09 NOTE — Patient Instructions (Signed)
Recommendations to prevent or slow progression of cognitive decline:   Exercise You should increase exercise 30 to 45 minutes per day at least 3 days a week although 5 to 7 would be preferred. Any type of exercise (including walking) is acceptable although a recumbent bicycle may be best if you are unsteady. Disease related apathy can be a significant roadblock to exercise and the only way to overcome this is to make it a daily routine and perhaps have a reward at the end (something your loved one loves to eat or drink perhaps) or a personal trainer coming to the home can also be very useful. In general a structured, repetitive schedule is best.   Cardiovascular Health: You should optimize all cardiovascular risk factors (blood pressure, sugar, cholesterol) as vascular disease such as strokes and heart attacks can make memory problems much worse.   Diet: Eating a heart healthy (Mediterranean) diet is also a good idea; fish and poultry instead of red meat, nuts (mostly non-peanuts), vegetables, fruits, olive oil or canola oil (instead of butter), minimal salt (use other spices to flavor foods), whole grain rice, bread, cereal and pasta and wine in moderation.  General Health: Any diseases which effect your body will effect your brain such as a pneumonia, urinary infection, blood clot, heart attack or stroke. Keep contact with your primary care doctor for regular follow ups.  Sleep. A good nights sleep is healthy for the brain. Seven hours is recommended. If you have insomnia or poor sleep habits see the recommendations below  Tips: Structured and consistent daytime and nighttime routine, including regular wake times, bedtimes, and mealtimes, will be important for the patient to avoid confusion. Keeping frequently used items in designated places will help reduce stress from searching. If there are worries about getting lost do not let the patient leave home unaccompanied. They might benefit from wearing an  identification bracelet that will help others assist in finding home if they become lost. Information about nationwide safe return services and other helpful resources may be obtained through the Alzheimer's Association helpline at [phone].  Finances, Power of Producer, television/film/video Directives: You should consider putting legal safeguards in place with regard to financial and medical decision making. While the spouse always has power of attorney for medical and financial issues in the absence of any form, you should consider what you want in case the spouse / caregiver is no longer around or capable of making decisions.   [place] : http://www.welch.com/.pdf  Or Google "[place]" AND "An Forensic scientist for [employer]  Other States: ApartmentMom.com.ee  The signature on these forms should be notarized.   DRIVING:   Driving only during the day Drive only to familiar Locations Avoid driving during bad weather  If you would like to be tested to see if you are driving safely, [facility] has a Clinical Driving Evaluation. To schedule an appointment call [phone].                RESOURCES:  Memory Loss: Improve your short term memory By Silvio Pate  The Alzheimer's Reading Room http://www.alzheimersreadingroom.com/   The Alzheimer's Compendium http://www.alzcompend.info/  [employer] www.dukefamilysupport.K500091 336-109-8223  Recommended resources for caregivers (All can be purchased on [employer]):  1) A Caregiver's Guide to Dementia: Using Activities and Other Strategies to Prevent, Reduce and Manage Behavioral Symptoms by Osie Bond. [place] and [employer]   2) A Caregiver's Guide to [employer] Dementia by Caleen Essex MS BSN and Jeneen Rinks  [place]   3) What If It's Not Alzheimer's?: A Caregiver's Guide to  Dementia by Koren Shiver (Author), Octaviano Batty (Editor)  3) The 36 hour day by Rabins and Mace  4) Understanding Difficult Behaviors by Merita Norton and White  Online course for helping caregivers reduce stress, guilt and frustration called the Caregivers Helpbook. The website is www.powerfultoolsforcaregivers.org  As a caregiver you are a Art gallery manager. Problems you face as a caregiver are usually unique to your situation and the way your loved-one's disease manifests itself. The best way to use these books is to look at the Table of Contents and read any chapters of interest or that apply to challenges you are having as a caregiver.  NATIONAL RESOURCES: For more information on neurological disorders or research programs funded by the [employer] of Neurological Disorders and Stroke, contact the Institute's Agricultural consultant (BRAIN) at: [place] [address] [phone] (toll-free) MasterBoxes.it  Information on dementia is also available from the following organizations: Alzheimer's Disease Education and Referral ([place]) [place] [facility] [address] [place] [address] [phone] (toll-free) DVDEnthusiasts.nl  Alzheimer's Association [address], [place] [address] [phone] (toll-free, 24-hour helpline) [phone] (TDD) CapitalMile.co.nz  Alzheimer's Foundation of America [address], [place] [address] [phone] (toll-free) www.alzfdn.org  [facility] [address] [place] [address] [phone] www.alzdiscovery.org  Association for [address] [place] [address] [phone] (toll-free) [place] [address] [phone]  (toll-free) www.brightfocus.org/alzheimers  Doran Stabler [facility] Alzheimer's [facility] [address], [place] [address] [phone] www.https://lambert-jackson.net/  Lewy Body Dementia Association [address] [place] [address] [phone] (toll-free LBD Caregiver Link) www.lbda.[place], [place], [place] [phone] (toll-free) [phone] [facility]) https://carter.com/  National Organization for Rare Disorders [address] [place] [address] [id number] ([phone]) (toll-free) www.rarediseases.org  The Dementias: Hope Through Research was jointly produced by the [employer] of Neurological Disorders and Stroke (NINDS) and the [employer] on Aging (NIA), both part of the [employer], the [employer] research agency--supporting scientific studies that turn discovery into health. NINDS is the nation's leading funder of research on the brain and nervous system. The NINDS mission is to reduce the burden of neurological disease. For more information and resources, visit MasterBoxes.it [1] or call (734)317-2498. NIA leads the federal government effort conducting and supporting research on aging and the health and well-being of older people. NIA's Alzheimer's Disease Education and Referral (ADEAR) Center offers information and publications on dementia and caregiving for families, caregivers, and professionals. For more information, visit DVDEnthusiasts.nl [2] or call [phone]. Also available from NIA are publications and information about Alzheimer's disease as well as the booklets Frontotemporal Disorders: Information for Patients, Families, and Caregivers and Lewy Body Dementia: Information for Patients, Families, and Professionals. Source URL:  SocialSpecialists.co.nz

## 2020-02-29 DIAGNOSIS — Z23 Encounter for immunization: Secondary | ICD-10-CM | POA: Diagnosis not present

## 2020-03-03 DIAGNOSIS — R609 Edema, unspecified: Secondary | ICD-10-CM | POA: Diagnosis not present

## 2020-03-03 DIAGNOSIS — F0281 Dementia in other diseases classified elsewhere with behavioral disturbance: Secondary | ICD-10-CM | POA: Diagnosis not present

## 2020-04-03 DIAGNOSIS — I110 Hypertensive heart disease with heart failure: Secondary | ICD-10-CM | POA: Diagnosis not present

## 2020-04-03 DIAGNOSIS — E669 Obesity, unspecified: Secondary | ICD-10-CM | POA: Diagnosis not present

## 2020-04-03 DIAGNOSIS — I5032 Chronic diastolic (congestive) heart failure: Secondary | ICD-10-CM | POA: Diagnosis not present

## 2020-04-03 DIAGNOSIS — Z6841 Body Mass Index (BMI) 40.0 and over, adult: Secondary | ICD-10-CM | POA: Diagnosis not present

## 2020-04-03 DIAGNOSIS — I359 Nonrheumatic aortic valve disorder, unspecified: Secondary | ICD-10-CM | POA: Diagnosis not present

## 2020-04-03 DIAGNOSIS — E782 Mixed hyperlipidemia: Secondary | ICD-10-CM | POA: Diagnosis not present

## 2020-06-09 ENCOUNTER — Other Ambulatory Visit: Payer: Self-pay | Admitting: Neurology

## 2020-09-29 DIAGNOSIS — E119 Type 2 diabetes mellitus without complications: Secondary | ICD-10-CM | POA: Diagnosis not present

## 2020-09-29 DIAGNOSIS — N183 Chronic kidney disease, stage 3 unspecified: Secondary | ICD-10-CM | POA: Diagnosis not present

## 2020-10-03 DIAGNOSIS — N3000 Acute cystitis without hematuria: Secondary | ICD-10-CM | POA: Diagnosis not present

## 2020-10-13 DIAGNOSIS — Z01812 Encounter for preprocedural laboratory examination: Secondary | ICD-10-CM | POA: Diagnosis not present

## 2020-10-13 DIAGNOSIS — N183 Chronic kidney disease, stage 3 unspecified: Secondary | ICD-10-CM | POA: Diagnosis not present

## 2020-10-13 DIAGNOSIS — I5032 Chronic diastolic (congestive) heart failure: Secondary | ICD-10-CM | POA: Diagnosis not present

## 2020-10-13 DIAGNOSIS — G309 Alzheimer's disease, unspecified: Secondary | ICD-10-CM | POA: Diagnosis not present

## 2020-10-13 DIAGNOSIS — Z79899 Other long term (current) drug therapy: Secondary | ICD-10-CM | POA: Diagnosis not present

## 2020-10-13 DIAGNOSIS — F0281 Dementia in other diseases classified elsewhere with behavioral disturbance: Secondary | ICD-10-CM | POA: Diagnosis not present

## 2020-12-01 DIAGNOSIS — Z23 Encounter for immunization: Secondary | ICD-10-CM | POA: Diagnosis not present

## 2021-01-12 DIAGNOSIS — Z79899 Other long term (current) drug therapy: Secondary | ICD-10-CM | POA: Diagnosis not present

## 2021-01-12 DIAGNOSIS — I5032 Chronic diastolic (congestive) heart failure: Secondary | ICD-10-CM | POA: Diagnosis not present

## 2021-01-12 DIAGNOSIS — N183 Chronic kidney disease, stage 3 unspecified: Secondary | ICD-10-CM | POA: Diagnosis not present

## 2021-01-12 DIAGNOSIS — G300 Alzheimer's disease with early onset: Secondary | ICD-10-CM | POA: Diagnosis not present

## 2021-01-12 DIAGNOSIS — G309 Alzheimer's disease, unspecified: Secondary | ICD-10-CM | POA: Diagnosis not present

## 2021-04-13 DIAGNOSIS — I5032 Chronic diastolic (congestive) heart failure: Secondary | ICD-10-CM | POA: Diagnosis not present

## 2021-04-13 DIAGNOSIS — G300 Alzheimer's disease with early onset: Secondary | ICD-10-CM | POA: Diagnosis not present

## 2021-04-13 DIAGNOSIS — G309 Alzheimer's disease, unspecified: Secondary | ICD-10-CM | POA: Diagnosis not present

## 2021-04-13 DIAGNOSIS — N183 Chronic kidney disease, stage 3 unspecified: Secondary | ICD-10-CM | POA: Diagnosis not present

## 2021-04-13 DIAGNOSIS — N1832 Chronic kidney disease, stage 3b: Secondary | ICD-10-CM | POA: Diagnosis not present

## 2021-04-13 DIAGNOSIS — Z79899 Other long term (current) drug therapy: Secondary | ICD-10-CM | POA: Diagnosis not present

## 2021-07-13 DIAGNOSIS — I359 Nonrheumatic aortic valve disorder, unspecified: Secondary | ICD-10-CM | POA: Diagnosis not present

## 2021-07-13 DIAGNOSIS — G5600 Carpal tunnel syndrome, unspecified upper limb: Secondary | ICD-10-CM | POA: Diagnosis not present

## 2021-07-13 DIAGNOSIS — N1832 Chronic kidney disease, stage 3b: Secondary | ICD-10-CM | POA: Diagnosis not present

## 2021-07-13 DIAGNOSIS — E785 Hyperlipidemia, unspecified: Secondary | ICD-10-CM | POA: Diagnosis not present

## 2021-07-13 DIAGNOSIS — N200 Calculus of kidney: Secondary | ICD-10-CM | POA: Diagnosis not present

## 2021-07-13 DIAGNOSIS — I10 Essential (primary) hypertension: Secondary | ICD-10-CM | POA: Diagnosis not present

## 2021-07-13 DIAGNOSIS — I5032 Chronic diastolic (congestive) heart failure: Secondary | ICD-10-CM | POA: Diagnosis not present

## 2021-07-13 DIAGNOSIS — F0281 Dementia in other diseases classified elsewhere with behavioral disturbance: Secondary | ICD-10-CM | POA: Diagnosis not present

## 2021-08-10 DIAGNOSIS — F0281 Dementia in other diseases classified elsewhere with behavioral disturbance: Secondary | ICD-10-CM | POA: Diagnosis not present

## 2021-08-15 DIAGNOSIS — E785 Hyperlipidemia, unspecified: Secondary | ICD-10-CM | POA: Diagnosis not present

## 2021-08-15 DIAGNOSIS — I10 Essential (primary) hypertension: Secondary | ICD-10-CM | POA: Diagnosis not present

## 2021-08-30 ENCOUNTER — Telehealth: Payer: Self-pay | Admitting: Neurology

## 2021-08-30 NOTE — Telephone Encounter (Signed)
Yes this is ok 

## 2021-08-30 NOTE — Telephone Encounter (Signed)
Pt's daughter, Ambrose Finland ( on the [facility]) called, after getting Covid her Alzheimer's has progressed. She climbed out the window screaming for help. The care facility feels that PCP not providing the right medication for her Alzheimer's.Would like a call from the nurse to discuss scheduling a sooner appt.

## 2021-08-30 NOTE — Telephone Encounter (Signed)
Spoke with pt's daughter [name].  The patient had COVID back in August and has had a downward spiral mentally since then.  [name] stated the patient is currently at a care home that has 6 ladies there and and an Therapist, sports.  She does not want to move the patient to another facility because she feels like it would lead to the pt's demise but wonders if she is overthinking.  She is unsure of the doses, but she states the patient is on lorazepam, sertraline, memantine, donepezil, and Seroquel 200 mg.  The Seroquel was recently increased by primary care.  She states the patient's nurse at her facility is really concerned about the patient's hallucinations.  Patient gets incredibly nervous and shakes.  The other day she opened the window of her facility and screamed for help which the daughter understands would be fairly normal for dementia, however the nurse is questioning whether the patient needs to be on Risperdal. The patient's daughter is requesting that we advise her on what to do.  She does not want to keep putting the patient on more medication but she does see how distressed the patient is and wants to do the right thing for her.  I have the patient scheduled with Megan NP for Thursday, September 29 at 2:30 PM arrival 2:00 for a memory test.  She verbalized appreciation.  Of note, the patient was referred to Dr Sima Matas 01/2020 but has not had the testing done.

## 2021-09-13 ENCOUNTER — Encounter: Payer: Self-pay | Admitting: Adult Health

## 2021-09-13 ENCOUNTER — Other Ambulatory Visit: Payer: Self-pay

## 2021-09-13 ENCOUNTER — Ambulatory Visit (INDEPENDENT_AMBULATORY_CARE_PROVIDER_SITE_OTHER): Payer: Medicare Other | Admitting: Adult Health

## 2021-09-13 VITALS — BP 144/83 | HR 83 | Ht 63.0 in | Wt 219.6 lb

## 2021-09-13 DIAGNOSIS — G301 Alzheimer's disease with late onset: Secondary | ICD-10-CM

## 2021-09-13 DIAGNOSIS — F0281 Dementia in other diseases classified elsewhere with behavioral disturbance: Secondary | ICD-10-CM | POA: Diagnosis not present

## 2021-09-13 DIAGNOSIS — R443 Hallucinations, unspecified: Secondary | ICD-10-CM | POA: Diagnosis not present

## 2021-09-13 DIAGNOSIS — F02818 Dementia in other diseases classified elsewhere, unspecified severity, with other behavioral disturbance: Secondary | ICD-10-CM

## 2021-09-13 MED ORDER — RISPERIDONE 0.5 MG PO TABS: ORAL_TABLET | ORAL | 5 refills | Status: DC

## 2021-09-13 MED ORDER — QUETIAPINE FUMARATE 100 MG PO TABS: ORAL_TABLET | ORAL | 0 refills | Status: DC

## 2021-09-13 NOTE — Progress Notes (Signed)
PATIENT: Judith Schroeder DOB: Aug 01, 1946  REASON FOR VISIT: follow up HISTORY FROM: patient PRIMARY NEUROLOGIST:   HISTORY OF PRESENT ILLNESS: Today 09/13/21:  [name] is a 75 year old female with a history of memory disturbance.  She returns today for follow-up.  She is here with her daughter and nurse.  [facility].  Her nurse states that she does require some prompting and assistance with ADLs.  Reports that she eats well for breakfast and lunch.  The patient's daughter and nurse main concern today is hallucinations.  She states that this typically start in the afternoons.  They are uncontrolled.  The patient gets extremely agitated this leads to uncontrollable anxiety.  She sees people and objects.  Her PCP increased her dose of Seroquel to 200 mg --family reports that this may have offered a small improvement.  The patient's family would like her medication switched.  HISTORY Interval history 02/09/2020: Patient here for follow up of dementia. She is doing very well er MMSE which today [place] 21/30 (last was 23 in 2018). She is here with her daughter. She lives with 6 other women, they have a cook and support. She is working at adjusting and trying to make mental attitude strong. She is trying to use word books, trying to exercise her mind, she is trying to read. Her best friend died this past week and the rest of the people she lives with are mode advanced than she is. Daughter thinks it is a nice place, she has been able to visit, it is smaller all ladies, a very nice house, a cook 3 meals a day, she feels like she is at home. Eating well. Affordable and close to daughter. She has a snack box, it makes it her happy. Noticeable worse, more agitated. The same conversations, the story plots are more "bizarre" not factual. She is seeing some things that are not there, like seeing a squirrel  REVIEW OF SYSTEMS: Out of a complete 14 system review of symptoms,  the patient complains only of the following symptoms, and all other reviewed systems are negative.  See HPI  ALLERGIES: Allergies  Allergen Reactions   Atorvastatin Other (See Comments)    Muscle pain    HOME MEDICATIONS: Outpatient Medications Prior to Visit  Medication Sig Dispense Refill   aspirin EC 81 MG tablet Take 81 mg by mouth daily.      cholecalciferol (VITAMIN D3) 25 MCG (1000 UT) tablet Take 1,000 Units by mouth every morning.     docusate sodium (COLACE) 100 MG capsule Take 100 mg by mouth 2 (two) times daily.     donepezil (ARICEPT) 10 MG tablet Take 1 tablet (10 mg total) by mouth at bedtime. 90 tablet 4   eplerenone (INSPRA) 25 MG tablet Take 25 mg by mouth every morning.      furosemide (LASIX) 20 MG tablet Take 20 mg by mouth daily. Take 1 tablet by mouth in addition to scheduled dose if weight gain of 3 lbs in 1 day and or 5 lbs/ 1 week for 3 days.     levocetirizine (XYZAL) 5 MG tablet Take 5 mg by mouth at bedtime.     LORazepam (ATIVAN) 0.5 MG tablet 1 tab every 8 hours as needed for agitation/anxiety that cannot be redirected. Watch for sedation and risk of falls. (Patient taking differently: Take 0.5 mg by mouth every 6 (six) hours as needed for anxiety. May take 1 tab every 6 hours  as needed for agitation/anxiety that cannot be redirected. Watch for sedation and risk of falls.) 30 tablet 4   magnesium oxide (MAG-OX) 400 MG tablet Take 400 mg by mouth daily.      memantine (NAMENDA) 10 MG tablet TAKE (1) TABLET BY MOUTH TWICE DAILY. (Patient taking differently: Take 10 mg by mouth 2 (two) times daily.) 60 tablet 11   metoprolol tartrate (LOPRESSOR) 25 MG tablet Take 25 mg by mouth daily.     QUEtiapine (SEROQUEL) 200 MG tablet Take by mouth.     rosuvastatin (CRESTOR) 5 MG tablet Take 5 mg by mouth every morning.      sertraline (ZOLOFT) 100 MG tablet Take 1 tablet (100 mg total) by mouth daily. 90 tablet 6   valACYclovir (VALTREX) 1000 MG tablet Take 1 g by mouth  every morning.  4   No facility-administered medications prior to visit.    PAST MEDICAL HISTORY: Past Medical History:  Diagnosis Date   Cancer [facility])    endometrial cancer   Carpal tunnel syndrome    Dementia ([place])    Dementia ([place]) 2018   Depression    H/O aortic valve replacement    High blood pressure    High cholesterol    Pleural effusion    Shingles 2019   L eye    PAST SURGICAL HISTORY: Past Surgical History:  Procedure Laterality Date   AORTIC VALVE REPLACEMENT  7793   APPLICATION OF WOUND VAC Left 10/2018   leg    CARDIAC VALVE REPLACEMENT     aortic   DILATION AND CURETTAGE OF UTERUS     EMPYEMA DRAINAGE  2017   LYMPH NODE BIOPSY N/A 02/25/2019   Procedure: Clement Husbands LYMPH NODE BIOPSY;  Surgeon: Everitt Amber, MD;  Location: [facility];  Service: Gynecology;  Laterality: N/A;   radiation for endometrial cancer     ROBOTIC ASSISTED TOTAL HYSTERECTOMY WITH BILATERAL SALPINGO OOPHERECTOMY Bilateral 02/25/2019   Procedure: XI ROBOTIC ASSISTED TOTAL HYSTERECTOMY WITH BILATERAL SALPINGO OOPHORECTOMY;  Surgeon: Everitt Amber, MD;  Location: [facility];  Service: Gynecology;  Laterality: Bilateral;   TUBAL LIGATION     VIDEO ASSISTED THORACOSCOPY (VATS)/EMPYEMA Right 09/07/2016   Procedure: VIDEO ASSISTED THORACOSCOPY (VATS)/EMPYEMA;  Surgeon: Gaye Pollack, MD;  Location: [facility] OR;  Service: Thoracic;  Laterality: Right;    FAMILY HISTORY: Family History  Problem Relation Age of Onset   Hypertension Mother    Cancer Father        head and neck    Dementia Father    Dementia Sister     SOCIAL HISTORY: Social History   Socioeconomic History   Marital status: Widowed    Spouse name: Not on file   Number of children: 1   Years of education: 12+   Highest education level: Not on file  Occupational History   Occupation: Retired  Tobacco Use   Smoking status: Never   Smokeless tobacco: Never  Vaping Use   Vaping Use: Never used  Substance and Sexual Activity   Alcohol  use: No    Alcohol/week: 0.0 standard drinks   Drug use: No   Sexual activity: Not Currently  Other Topics Concern   Not on file  Social History Narrative   Patient lives in a home with 6 other ladies. Has nursing staff and a cook there.     Patient is widowed.    Patient has 1 child   Patient has 2 years of college   Patient is right handed  Caffeine use: sweet tea daily   No soda    Social Determinants of Health   Financial Resource Strain: Not on file  Food Insecurity: Not on file  Transportation Needs: Not on file  Physical Activity: Not on file  Stress: Not on file  Social Connections: Not on file  Intimate Partner Violence: Not on file      PHYSICAL EXAM  Vitals:   09/13/21 1421  BP: (!) 144/83  Pulse: 83  Weight: 219 lb 9.6 oz (99.6 kg)  Height: 5\' 3"  (1.6 m)   Body mass index is 38.9 kg/m.  MMSE - Mini Mental State Exam 09/13/2021 02/09/2020 09/23/2018  Orientation to time 2 1 4   Orientation to Place 4 4 5   Registration 3 3 3   Attention/ Calculation 1 4 1   Recall 0 1 0  Language- name 2 objects 2 2 2   Language- repeat 1 1 0  Language- follow 3 step command 3 3 3   Language- read & follow direction 0 1 1  Write a sentence 1 1 1   Copy design 0 0 1  Total score 17 21 21      Generalized: Well developed, in no acute distress   Neurological examination  Mentation: Alert oriented to time, place, history taking. Follows all commands speech and language fluent Cranial nerve II-XII: Pupils were equal round reactive to light. Extraocular movements were full, visual field were full on confrontational test. Facial sensation and strength were normal. Uvula tongue midline. Head turning and shoulder shrug  were normal and symmetric. Motor: The motor testing reveals 5 over 5 strength of all 4 extremities. Good symmetric motor tone is noted throughout.  Sensory: Sensory testing is intact to soft touch on all 4 extremities. No evidence of extinction is noted.   Coordination: Cerebellar testing reveals good finger-nose-finger and heel-to-shin bilaterally.  Gait and station: Gait is normal.    DIAGNOSTIC DATA (LABS, IMAGING, TESTING) - I reviewed patient records, labs, notes, testing and imaging myself where available.  Lab Results  Component Value Date   WBC 9.0 02/19/2019   HGB 14.5 02/19/2019   HCT 45.8 02/19/2019   MCV 104.8 (H) 02/19/2019   PLT 245 02/19/2019      Component Value Date/Time   NA 140 02/19/2019 1155   NA 141 09/23/2018 1400   K 4.1 02/19/2019 1155   CL 104 02/19/2019 1155   CO2 26 02/19/2019 1155   GLUCOSE 107 (H) 02/19/2019 1155   BUN 18 02/19/2019 1155   BUN 12 09/23/2018 1400   CREATININE 1.13 (H) 02/19/2019 1155   CALCIUM 8.9 02/19/2019 1155   PROT 7.0 02/19/2019 1155   PROT 6.7 09/23/2018 1400   ALBUMIN 4.0 02/19/2019 1155   ALBUMIN 4.4 09/23/2018 1400   AST 25 02/19/2019 1155   ALT 17 02/19/2019 1155   ALKPHOS 92 02/19/2019 1155   BILITOT 1.3 (H) 02/19/2019 1155   BILITOT 2.2 (H) 09/23/2018 1400   GFRNONAA 49 (L) 02/19/2019 1155   GFRAA 56 (L) 02/19/2019 1155   No results found for: CHOL, HDL, LDLCALC, LDLDIRECT, TRIG, CHOLHDL No results found for: HGBA1C Lab Results  Component Value Date   [id number] 419 02/04/2017   Lab Results  Component Value Date   TSH 1.740 11/23/2014      ASSESSMENT AND PLAN 75 y.o. year old female  has a past medical history of Cancer ([place]), Carpal tunnel syndrome, Dementia ([place]), Dementia ([place]) (2018), Depression, H/O aortic valve replacement, High blood pressure, High cholesterol, Pleural effusion, and Shingles (  2019). here with :  1.  Dementia with behavioral disturbance 2.  Hallucinations  -Discussed with Dr. Jannifer Franklin.  The patient will decrease Seroquel to 100 mg for 2 weeks then decrease to 50 mg for 1 week then stop the medication -The patient will start Risperdal 0.5 mg daily for 2 weeks then increase to 1 mg thereafter. -The patient will start Risperdal as  she weaning off of Seroquel -Information sheet was given to the patient and her family regarding Risperdal -Continue Aricept 10 mg at bedtime -Continue Namenda 10 mg twice a day - Continue Ativan 0.5 mg 1 tablet every 8 hours as needed -Follow-up in 4 months or sooner if needed     Ward Givens, MSN, NP-C 09/13/2021, 2:47 PM [facility] [address], [place], [place] [address] [phone]

## 2021-09-13 NOTE — Patient Instructions (Signed)
Your Plan:  Decrease Seroquel to 100 mg for 2 weeks then decrease to 50 mg for 1 week then stop the medication. Start Risperdal 0.5 mg daily for 2 weeks then increase to 1 mg daily thereafter If your symptoms worsen or you develop new symptoms please let us know.   Thank you for coming to see [place] at [facility]. I hope we have been able to provide you high quality care today.  You may receive a patient satisfaction survey over the next few weeks. We would appreciate your feedback and comments so that we may continue to improve ourselves and the health of our patients.

## 2021-09-17 ENCOUNTER — Telehealth: Payer: Self-pay | Admitting: Adult Health

## 2021-09-17 NOTE — Telephone Encounter (Signed)
I was under the impression at the office visit that she was taking 200 mg daily not twice a day.  Therefore the weaning schedule will change.  She will decrease to 100 mg twice a day for 1 week, then 50 mg twice a day for 1 week then 50 mg daily for 1 week then stop the medication  Risperdal she can take 0.5 mg daily for 2 weeks then increase to 1 mg daily thereafter

## 2021-09-17 NOTE — Telephone Encounter (Signed)
[facility]) called, decrease in QUEtiapine (SEROQUEL) 100 MG tablet does not address pt's 2 pm dosage. Would like a call from the nurse to clarify dosage.

## 2021-09-17 NOTE — Telephone Encounter (Signed)
I called Judith Schroeder, at pt group Home.  Pt hat ben taking seroquel 200mg  po BID (2p and 8P) initial when in for the appt.  She stated that she recalled that would decrease the seroquel by half initially then taper off as listed.  Over the weekend, Friday the pt has been getting 100mg  po bid. 2p and 8p.  She wanted to know if this was correct then will need this sent to [facility] to update dosing for pt.

## 2021-09-17 NOTE — Telephone Encounter (Signed)
Spoke with Magda Paganini at the group home and provided the new weaning schedule for the Seroquel.  She verbalized understanding.  She did ask however to clarify with [facility] NP if patient should completely come off the Seroquel.  She states the patient has always been on Seroquel since she has been working with her and was initially on 50 mg.  She states the patient did really well in the office the other day and overall she has improved but they do still have some rough times with hallucinations in the group home. Recently she went outside the group home, refused to come back in, and Leslie's husband had to convince the patient to go back in.  She requested that when we send the Seroquel order in to make sure all previous orders have been discontinued so the pharmacy understands this new order is not in addition to what she already takes but will replace. They are giving the patient the Risperdal.

## 2021-09-18 MED ORDER — QUETIAPINE FUMARATE 100 MG PO TABS: ORAL_TABLET | ORAL | 0 refills | Status: DC

## 2021-09-18 NOTE — Telephone Encounter (Signed)
Judith Schroeder returned phone call. Would like a call back.

## 2021-09-18 NOTE — Addendum Note (Signed)
Addended by: Brandon Melnick on: 09/18/2021 02:49 PM   Modules accepted: Orders

## 2021-09-18 NOTE — Addendum Note (Signed)
Addended by: Trudie Buckler on: 09/18/2021 04:07 PM   Modules accepted: Orders

## 2021-09-18 NOTE — Telephone Encounter (Signed)
Yes I would like her to try to wean off the Seroquel.  Ideally we would want her only on 1 antipsychotic if possible.  If needed in the future we can add Seroquel back

## 2021-09-18 NOTE — Telephone Encounter (Signed)
Called Leslie and LVM with office number asking for call back.

## 2021-09-20 ENCOUNTER — Telehealth: Payer: Self-pay

## 2021-09-20 NOTE — Telephone Encounter (Signed)
Submitted a PA request for risperidone 1mg  on CMM, Key: [id number]. Awaiting determination from [place].

## 2021-09-21 DIAGNOSIS — R609 Edema, unspecified: Secondary | ICD-10-CM | POA: Diagnosis not present

## 2021-09-21 DIAGNOSIS — F02811 Dementia in other diseases classified elsewhere, unspecified severity, with agitation: Secondary | ICD-10-CM | POA: Diagnosis not present

## 2021-09-21 DIAGNOSIS — Z23 Encounter for immunization: Secondary | ICD-10-CM | POA: Diagnosis not present

## 2021-09-24 NOTE — Telephone Encounter (Signed)
Approved, Effective from 09/20/2021 through 09/20/2022.

## 2021-09-25 DIAGNOSIS — N200 Calculus of kidney: Secondary | ICD-10-CM | POA: Diagnosis not present

## 2021-09-25 DIAGNOSIS — I10 Essential (primary) hypertension: Secondary | ICD-10-CM | POA: Diagnosis not present

## 2021-09-25 DIAGNOSIS — N1832 Chronic kidney disease, stage 3b: Secondary | ICD-10-CM | POA: Diagnosis not present

## 2021-09-25 DIAGNOSIS — R609 Edema, unspecified: Secondary | ICD-10-CM | POA: Diagnosis not present

## 2021-09-25 DIAGNOSIS — I359 Nonrheumatic aortic valve disorder, unspecified: Secondary | ICD-10-CM | POA: Diagnosis not present

## 2021-09-25 DIAGNOSIS — E785 Hyperlipidemia, unspecified: Secondary | ICD-10-CM | POA: Diagnosis not present

## 2021-09-28 ENCOUNTER — Encounter: Payer: Self-pay | Admitting: Adult Health

## 2021-09-28 DIAGNOSIS — F02818 Dementia in other diseases classified elsewhere, unspecified severity, with other behavioral disturbance: Secondary | ICD-10-CM

## 2021-09-28 NOTE — Telephone Encounter (Signed)
We can contact the facility in [place] for medication adjustment, and increase medication for sedation. I will discuss with Mrs. Judith Schroeder on Monday. CD

## 2021-09-30 ENCOUNTER — Encounter (HOSPITAL_COMMUNITY): Payer: Self-pay

## 2021-09-30 ENCOUNTER — Other Ambulatory Visit: Payer: Self-pay

## 2021-09-30 ENCOUNTER — Emergency Department (HOSPITAL_COMMUNITY): Payer: Medicare Other

## 2021-09-30 ENCOUNTER — Emergency Department (HOSPITAL_COMMUNITY)
Admission: EM | Admit: 2021-09-30 | Discharge: 2021-09-30 | Disposition: A | Discharge status: Home / Self Care | Payer: Medicare Other | Attending: Emergency Medicine | Admitting: Emergency Medicine

## 2021-09-30 DIAGNOSIS — S161XXA Strain of muscle, fascia and tendon at neck level, initial encounter: Secondary | ICD-10-CM | POA: Insufficient documentation

## 2021-09-30 DIAGNOSIS — Z8542 Personal history of malignant neoplasm of other parts of uterus: Secondary | ICD-10-CM | POA: Diagnosis not present

## 2021-09-30 DIAGNOSIS — F0390 Unspecified dementia without behavioral disturbance: Secondary | ICD-10-CM | POA: Diagnosis not present

## 2021-09-30 DIAGNOSIS — Z7982 Long term (current) use of aspirin: Secondary | ICD-10-CM | POA: Insufficient documentation

## 2021-09-30 DIAGNOSIS — S199XXA Unspecified injury of neck, initial encounter: Secondary | ICD-10-CM | POA: Diagnosis not present

## 2021-09-30 DIAGNOSIS — G319 Degenerative disease of nervous system, unspecified: Secondary | ICD-10-CM | POA: Diagnosis not present

## 2021-09-30 DIAGNOSIS — M25552 Pain in left hip: Secondary | ICD-10-CM | POA: Diagnosis not present

## 2021-09-30 DIAGNOSIS — Z79899 Other long term (current) drug therapy: Secondary | ICD-10-CM | POA: Diagnosis not present

## 2021-09-30 DIAGNOSIS — S0990XA Unspecified injury of head, initial encounter: Secondary | ICD-10-CM | POA: Diagnosis not present

## 2021-09-30 DIAGNOSIS — M25551 Pain in right hip: Secondary | ICD-10-CM | POA: Diagnosis not present

## 2021-09-30 DIAGNOSIS — M5030 Other cervical disc degeneration, unspecified cervical region: Secondary | ICD-10-CM | POA: Diagnosis not present

## 2021-09-30 DIAGNOSIS — R0902 Hypoxemia: Secondary | ICD-10-CM | POA: Diagnosis not present

## 2021-09-30 DIAGNOSIS — W06XXXA Fall from bed, initial encounter: Secondary | ICD-10-CM | POA: Insufficient documentation

## 2021-09-30 DIAGNOSIS — M25571 Pain in right ankle and joints of right foot: Secondary | ICD-10-CM | POA: Diagnosis not present

## 2021-09-30 DIAGNOSIS — W19XXXA Unspecified fall, initial encounter: Secondary | ICD-10-CM

## 2021-09-30 DIAGNOSIS — R52 Pain, unspecified: Secondary | ICD-10-CM | POA: Diagnosis not present

## 2021-09-30 NOTE — ED Notes (Signed)
Pt to xray

## 2021-09-30 NOTE — Discharge Instructions (Addendum)
CT head CT neck without any acute findings.  X-ray of both hips and pelvis without any acute findings.  The CT neck picked up on old injury to the second and third thoracic vertebrae.  Nothing new.  Stable for discharge back to facility.

## 2021-09-30 NOTE — ED Notes (Signed)
Pt reports she does not know her "right from her left" and Pt is now c/o left elbow pain.
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
## 2021-09-30 NOTE — ED Triage Notes (Signed)
Pt arrived via [facility] after being found on the floor leaning on her bed. Staff report Pt fell trying to get out of bed this morning. Pt c/o right elbow, lower right leg, neck and upper back pain.

## 2021-10-01 NOTE — Addendum Note (Signed)
Addended by: Trudie Buckler on: 10/01/2021 02:58 PM   Modules accepted: Orders

## 2021-10-02 NOTE — Telephone Encounter (Signed)
Referral sent to [facility]. Phone: [phone].
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
## 2021-10-10 NOTE — Telephone Encounter (Signed)
Magda Paganini RN called with some questions regarding pt's medication. Please call back.

## 2021-10-11 ENCOUNTER — Other Ambulatory Visit: Payer: Self-pay | Admitting: Neurology

## 2021-10-11 MED ORDER — RISPERIDONE 0.5 MG PO TABS: 1.5000 mg | ORAL_TABLET | Freq: Every day | ORAL | 5 refills | Status: DC

## 2021-10-11 NOTE — Telephone Encounter (Signed)
I called and LMVM for Magda Paganini at group home that Dr. Jaynee Eagles did increase and did send a new prescription of risperdol 0.5mg  tablets.  Will take 3 tablets (1.5mg  po qhs).  She is to call back Monday if questions.

## 2021-10-15 DIAGNOSIS — I10 Essential (primary) hypertension: Secondary | ICD-10-CM | POA: Diagnosis not present

## 2021-10-15 DIAGNOSIS — E785 Hyperlipidemia, unspecified: Secondary | ICD-10-CM | POA: Diagnosis not present

## 2021-10-17 NOTE — Telephone Encounter (Signed)
Judith Schroeder called back and reporting that pt is on risperdal 2mg  po qhs (an increase from pcp of 0.5mg  daily). Takes with melatonin 5mg  qhs.  Last communication was 0.5mg  po TID from Dr. Jaynee Eagles and this message was left for Judith Schroeder at the facility, she did not get this message until later.  Pt not sleeping, hallucinations better, although states pt stating seeing snakes in her room.   still has some anger/agitation issues.  Asking for some help with pt sleeping if this would help small dose seroquel?  Other ? Please advise.

## 2021-10-19 DIAGNOSIS — R609 Edema, unspecified: Secondary | ICD-10-CM | POA: Diagnosis not present

## 2021-10-19 DIAGNOSIS — S81801A Unspecified open wound, right lower leg, initial encounter: Secondary | ICD-10-CM | POA: Diagnosis not present

## 2021-10-19 DIAGNOSIS — F02811 Dementia in other diseases classified elsewhere, unspecified severity, with agitation: Secondary | ICD-10-CM | POA: Diagnosis not present

## 2021-10-22 NOTE — Telephone Encounter (Signed)
Magda Paganini called states pt is still have the nightmares and hallucinations. The Risperidone is helping but they are asking for a sleep sedative. Magda Paganini is requesting a call back.

## 2021-10-22 NOTE — Telephone Encounter (Signed)
Dr. Jaynee Eagles- Risperidone recently increased. Now requesting a sleep sedative. Thoughts?

## 2021-10-29 NOTE — Telephone Encounter (Signed)
I spoke with Magda Paganini.  She states that patient seems to be a little better on the risperidone as far as her night terrors are concerned. They have improved.  She reports the patient was not doing well on Seroquel.  She was a fall risk was having a hard time walking etc and the Risperidone has allowed her to be more functional/improved quality of life. However she wakes up around midnight, gets mixed up and is "ready to go".  The other night she was up for 6 hours, crawling around the floor.  Magda Paganini states the family tried 5 mg of melatonin over-the-counter but it did not help.  If she has a bad night, the next day she is confused.  Magda Paganini feels that if the patient maybe had something to help her sleep but did not carryover then it would help her.

## 2021-10-30 NOTE — Addendum Note (Signed)
Addended by: Brandon Melnick on: 10/30/2021 03:57 PM   Modules accepted: Orders

## 2021-10-30 NOTE — Telephone Encounter (Signed)
I called and spoke with Magda Paganini, and relayed the update of risperdal 2.5mg  po qhs. Send new dose to [facility],  I relayed per MM/NP and Dr. Jaynee Eagles trying not to add another medication if at all possible,  she appreciated call back and will call us back if needed.

## 2021-10-30 NOTE — Telephone Encounter (Signed)
Spoke with leslie,  pt is on risperdone 2mg  po qhs since 2 wks ago.  She is better, nightmares improved.  Has sleeping issue of up 2 nights then sleeps one night then up 2 nights then sleeps.  Let me know, I will call Magda Paganini back today.

## 2021-10-31 MED ORDER — RISPERIDONE 0.5 MG PO TABS: 2.5000 mg | ORAL_TABLET | Freq: Every day | ORAL | 1 refills | Status: DC

## 2021-10-31 NOTE — Addendum Note (Signed)
Addended by: Trudie Buckler on: 10/31/2021 08:51 AM   Modules accepted: Orders

## 2021-11-02 DIAGNOSIS — G309 Alzheimer's disease, unspecified: Secondary | ICD-10-CM | POA: Diagnosis not present

## 2021-11-13 ENCOUNTER — Other Ambulatory Visit: Payer: Self-pay | Admitting: Adult Health

## 2021-11-23 DIAGNOSIS — F0390 Unspecified dementia without behavioral disturbance: Secondary | ICD-10-CM | POA: Diagnosis not present

## 2021-11-23 DIAGNOSIS — I5032 Chronic diastolic (congestive) heart failure: Secondary | ICD-10-CM | POA: Diagnosis not present

## 2021-11-23 DIAGNOSIS — N3090 Cystitis, unspecified without hematuria: Secondary | ICD-10-CM | POA: Diagnosis not present

## 2021-11-29 ENCOUNTER — Other Ambulatory Visit: Payer: Self-pay | Admitting: Adult Health

## 2021-11-29 DIAGNOSIS — N3000 Acute cystitis without hematuria: Secondary | ICD-10-CM | POA: Diagnosis not present

## 2021-12-14 DIAGNOSIS — G309 Alzheimer's disease, unspecified: Secondary | ICD-10-CM | POA: Diagnosis not present

## 2021-12-19 DIAGNOSIS — F03A11 Unspecified dementia, mild, with agitation: Secondary | ICD-10-CM | POA: Diagnosis not present

## 2021-12-19 DIAGNOSIS — Z79899 Other long term (current) drug therapy: Secondary | ICD-10-CM | POA: Diagnosis not present

## 2021-12-19 DIAGNOSIS — I5032 Chronic diastolic (congestive) heart failure: Secondary | ICD-10-CM | POA: Diagnosis not present

## 2021-12-28 DIAGNOSIS — E875 Hyperkalemia: Secondary | ICD-10-CM | POA: Diagnosis not present

## 2021-12-28 DIAGNOSIS — I5030 Unspecified diastolic (congestive) heart failure: Secondary | ICD-10-CM | POA: Diagnosis not present

## 2021-12-28 DIAGNOSIS — N1832 Chronic kidney disease, stage 3b: Secondary | ICD-10-CM | POA: Diagnosis not present

## 2022-01-04 DIAGNOSIS — I509 Heart failure, unspecified: Secondary | ICD-10-CM | POA: Diagnosis not present

## 2022-01-04 DIAGNOSIS — Z515 Encounter for palliative care: Secondary | ICD-10-CM | POA: Diagnosis not present

## 2022-01-04 DIAGNOSIS — G309 Alzheimer's disease, unspecified: Secondary | ICD-10-CM | POA: Diagnosis not present

## 2022-01-13 DIAGNOSIS — I10 Essential (primary) hypertension: Secondary | ICD-10-CM | POA: Diagnosis not present

## 2022-01-13 DIAGNOSIS — E875 Hyperkalemia: Secondary | ICD-10-CM | POA: Diagnosis not present

## 2022-01-14 ENCOUNTER — Encounter: Payer: Self-pay | Admitting: Adult Health

## 2022-01-14 ENCOUNTER — Telehealth (INDEPENDENT_AMBULATORY_CARE_PROVIDER_SITE_OTHER): Payer: Medicare Other | Admitting: Adult Health

## 2022-01-14 DIAGNOSIS — G301 Alzheimer's disease with late onset: Secondary | ICD-10-CM | POA: Diagnosis not present

## 2022-01-14 DIAGNOSIS — F02818 Dementia in other diseases classified elsewhere, unspecified severity, with other behavioral disturbance: Secondary | ICD-10-CM | POA: Diagnosis not present

## 2022-01-14 NOTE — Progress Notes (Signed)
PATIENT: Judith Schroeder DOB: [birth year]  REASON FOR VISIT: follow up HISTORY FROM: patient  Virtual Visit via Video Note  I connected with [name] on 01/14/22 at  9:00 AM EST by a video enabled telemedicine application located remotely at [facility] and verified that I am speaking with the correct person using two identifiers who was located at their own home.   I discussed the limitations of evaluation and management by telemedicine and the availability of in person appointments. The patient expressed understanding and agreed to proceed.   PATIENT: Judith Schroeder DOB: 1946/10/02  REASON FOR VISIT: follow up HISTORY FROM: patient  HISTORY OF PRESENT ILLNESS: Today 01/14/22:  Ms. Plumb is a 76 year old female with a history of Alzheimer's dementia.  She returns today for virtual visit.  She has been seen by psychiatry and they are managing her medications in regards to agitation and hallucinations.  The patient's family that is with her right now is not sure what medications have been added.  She remains on Aricept and Namenda.  Reports that this was stopped a while back but the facility that she is that felt that her behavior got worse and asked that she be placed back on this medication.  They have contacted hospice care.  Family is considering full-time hospice care as they do not feel the facility that she is that can accommodate her behavioral issues.  She returns today for an evaluation.     REVIEW OF SYSTEMS: Out of a complete 14 system review of symptoms, the patient complains only of the following symptoms, and all other reviewed systems are negative.  ALLERGIES: Allergies  Allergen Reactions   Atorvastatin Other (See Comments)    Muscle pain    HOME MEDICATIONS: Outpatient Medications Prior to Visit  Medication Sig Dispense Refill   aspirin EC 81 MG tablet Take 81 mg by mouth daily.      cholecalciferol (VITAMIN D3) 25 MCG (1000 UT)  tablet Take 1,000 Units by mouth every morning.     docusate sodium (COLACE) 100 MG capsule Take 100 mg by mouth 2 (two) times daily.     donepezil (ARICEPT) 10 MG tablet Take 1 tablet (10 mg total) by mouth at bedtime. 90 tablet 4   eplerenone (INSPRA) 25 MG tablet Take 25 mg by mouth every morning.      furosemide (LASIX) 20 MG tablet Take 20 mg by mouth daily. Take 1 tablet by mouth in addition to scheduled dose if weight gain of 3 lbs in 1 day and or 5 lbs/ 1 week for 3 days.     levocetirizine (XYZAL) 5 MG tablet Take 5 mg by mouth at bedtime.     LORazepam (ATIVAN) 0.5 MG tablet 1 tab every 8 hours as needed for agitation/anxiety that cannot be redirected. Watch for sedation and risk of falls. (Patient taking differently: Take 0.5 mg by mouth every 6 (six) hours as needed for anxiety. May take 1 tab every 6 hours as needed for agitation/anxiety that cannot be redirected. Watch for sedation and risk of falls.) 30 tablet 4   magnesium oxide (MAG-OX) 400 MG tablet Take 400 mg by mouth daily.      memantine (NAMENDA) 10 MG tablet TAKE (1) TABLET BY MOUTH TWICE DAILY. (Patient taking differently: Take 10 mg by mouth 2 (two) times daily.) 60 tablet 11   metoprolol tartrate (LOPRESSOR) 25 MG tablet Take 25 mg by mouth daily.     risperiDONE (  RISPERDAL) 0.5 MG tablet Take 5 tablets (2.5 mg total) by mouth at bedtime. 150 tablet 1   rosuvastatin (CRESTOR) 5 MG tablet Take 5 mg by mouth every morning.      sertraline (ZOLOFT) 100 MG tablet Take 1 tablet (100 mg total) by mouth daily. 90 tablet 6   valACYclovir (VALTREX) 1000 MG tablet Take 1 g by mouth every morning.  4   No facility-administered medications prior to visit.    PAST MEDICAL HISTORY: Past Medical History:  Diagnosis Date   Cancer [facility])    endometrial cancer   Carpal tunnel syndrome    Dementia ([place])    Dementia ([place]) 2018   Depression    H/O aortic valve replacement    High blood pressure    High cholesterol    Pleural  effusion    Shingles 2019   L eye    PAST SURGICAL HISTORY: Past Surgical History:  Procedure Laterality Date   AORTIC VALVE REPLACEMENT  4287   APPLICATION OF WOUND VAC Left 10/2018   leg    CARDIAC VALVE REPLACEMENT     aortic   DILATION AND CURETTAGE OF UTERUS     EMPYEMA DRAINAGE  2017   LYMPH NODE BIOPSY N/A 02/25/2019   Procedure: Clement Husbands LYMPH NODE BIOPSY;  Surgeon: Everitt Amber, MD;  [facility];  Service: Gynecology;  Laterality: N/A;   radiation for endometrial cancer     ROBOTIC ASSISTED TOTAL HYSTERECTOMY WITH BILATERAL SALPINGO OOPHERECTOMY Bilateral 02/25/2019   Procedure: XI ROBOTIC ASSISTED TOTAL HYSTERECTOMY WITH BILATERAL SALPINGO OOPHORECTOMY;  Surgeon: Everitt Amber, MD;  Location[facility];  Service: Gynecology;  Laterality: Bilateral;   TUBAL LIGATION     VIDEO ASSISTED THORACOSCOPY (VATS)/EMPYEMA Right 09/07/2016   Procedure: VIDEO ASSISTED THORACOSCOPY (VATS)/EMPYEMA;  Surgeon: Gaye Pollack, MD;  Location: [facility];  Service: Thoracic;  Laterality: Right;    FAMILY HISTORY: Family History  Problem Relation Age of Onset   Hypertension Mother    Cancer Father        head and neck    Dementia Father    Dementia Sister     SOCIAL HISTORY: Social History   Socioeconomic History   Marital status: Widowed    Spouse name: Not on file   Number of children: 1   Years of education: 12+   Highest education level: Not on file  Occupational History   Occupation: Retired  Tobacco Use   Smoking status: Never   Smokeless tobacco: Never  Vaping Use   Vaping Use: Never used  Substance and Sexual Activity   Alcohol use: No    Alcohol/week: 0.0 standard drinks   Drug use: No   Sexual activity: Not Currently  Other Topics Concern   Not on file  Social History Narrative   Patient lives in a home with 6 other ladies. Has nursing staff and a cook there.     Patient is widowed.    Patient has 1 child   Patient has 2 years of college   Patient is right handed     Caffeine use: sweet tea daily   No soda    Social Determinants of Health   Financial Resource Strain: Not on file  Food Insecurity: Not on file  Transportation Needs: Not on file  Physical Activity: Not on file  Stress: Not on file  Social Connections: Not on file  Intimate Partner Violence: Not on file      PHYSICAL EXAM Generalized: Well developed, in no acute  distress   Neurological examination  Mentation: Sleepy- was up most of the night. Doesn't really engage with the video visit. Spoke to Family member most of the visit  Reflexes: UTA  DIAGNOSTIC DATA (LABS, IMAGING, TESTING) - I reviewed patient records, labs, notes, testing and imaging myself where available.  Lab Results  Component Value Date   WBC 9.0 02/19/2019   HGB 14.5 02/19/2019   HCT 45.8 02/19/2019   MCV 104.8 (H) 02/19/2019   PLT 245 02/19/2019      Component Value Date/Time   NA 140 02/19/2019 1155   NA 141 09/23/2018 1400   K 4.1 02/19/2019 1155   CL 104 02/19/2019 1155   CO2 26 02/19/2019 1155   GLUCOSE 107 (H) 02/19/2019 1155   BUN 18 02/19/2019 1155   BUN 12 09/23/2018 1400   CREATININE 1.13 (H) 02/19/2019 1155   CALCIUM 8.9 02/19/2019 1155   PROT 7.0 02/19/2019 1155   PROT 6.7 09/23/2018 1400   ALBUMIN 4.0 02/19/2019 1155   ALBUMIN 4.4 09/23/2018 1400   AST 25 02/19/2019 1155   ALT 17 02/19/2019 1155   ALKPHOS 92 02/19/2019 1155   BILITOT 1.3 (H) 02/19/2019 1155   BILITOT 2.2 (H) 09/23/2018 1400   GFRNONAA 49 (L) 02/19/2019 1155   GFRAA 56 (L) 02/19/2019 1155    Lab Results  Component Value Date   EQASTMHD62 229 02/04/2017   Lab Results  Component Value Date   TSH 1.740 11/23/2014      ASSESSMENT AND PLAN 76 y.o. year old female  has a past medical history of Cancer ([place]), Carpal tunnel syndrome, Dementia ([place]), Dementia ([place]) (2018), Depression, H/O aortic valve replacement, High blood pressure, High cholesterol, Pleural effusion, and Shingles (2019). here with:  1.   Alzheimer's disease with behavioral disturbance  Advised that I do not feel Aricept and Namenda is offering her any additional benefit at this time. Hospice care is an appropriate option for the patient at this time Psychiatry will continue to manage behavioral issues Patient will follow-up with our office on an as-needed basis     Ward Givens, MSN, NP-C 01/14/2022, 9:14 AM [facility] [address], [place], [place] [address] [phone]

## 2022-02-01 DIAGNOSIS — Z515 Encounter for palliative care: Secondary | ICD-10-CM | POA: Diagnosis not present

## 2022-02-01 DIAGNOSIS — G309 Alzheimer's disease, unspecified: Secondary | ICD-10-CM | POA: Diagnosis not present

## 2022-02-01 DIAGNOSIS — I509 Heart failure, unspecified: Secondary | ICD-10-CM | POA: Diagnosis not present

## 2022-02-15 DIAGNOSIS — G309 Alzheimer's disease, unspecified: Secondary | ICD-10-CM | POA: Diagnosis not present

## 2022-03-01 DIAGNOSIS — I5033 Acute on chronic diastolic (congestive) heart failure: Secondary | ICD-10-CM | POA: Diagnosis not present

## 2022-03-01 DIAGNOSIS — E876 Hypokalemia: Secondary | ICD-10-CM | POA: Diagnosis not present

## 2022-03-01 DIAGNOSIS — Z79899 Other long term (current) drug therapy: Secondary | ICD-10-CM | POA: Diagnosis not present

## 2022-03-01 DIAGNOSIS — N1832 Chronic kidney disease, stage 3b: Secondary | ICD-10-CM | POA: Diagnosis not present

## 2022-03-08 DIAGNOSIS — G300 Alzheimer's disease with early onset: Secondary | ICD-10-CM | POA: Diagnosis not present

## 2022-03-08 DIAGNOSIS — N183 Chronic kidney disease, stage 3 unspecified: Secondary | ICD-10-CM | POA: Diagnosis not present

## 2022-03-08 DIAGNOSIS — I5030 Unspecified diastolic (congestive) heart failure: Secondary | ICD-10-CM | POA: Diagnosis not present

## 2022-03-22 DIAGNOSIS — I509 Heart failure, unspecified: Secondary | ICD-10-CM | POA: Diagnosis not present

## 2022-03-22 DIAGNOSIS — G309 Alzheimer's disease, unspecified: Secondary | ICD-10-CM | POA: Diagnosis not present

## 2022-03-22 DIAGNOSIS — Z515 Encounter for palliative care: Secondary | ICD-10-CM | POA: Diagnosis not present

## 2022-04-26 DIAGNOSIS — G309 Alzheimer's disease, unspecified: Secondary | ICD-10-CM | POA: Diagnosis not present

## 2022-04-26 DIAGNOSIS — I509 Heart failure, unspecified: Secondary | ICD-10-CM | POA: Diagnosis not present

## 2022-04-26 DIAGNOSIS — Z515 Encounter for palliative care: Secondary | ICD-10-CM | POA: Diagnosis not present

## 2022-05-17 DIAGNOSIS — G309 Alzheimer's disease, unspecified: Secondary | ICD-10-CM | POA: Diagnosis not present

## 2022-05-27 DIAGNOSIS — G309 Alzheimer's disease, unspecified: Secondary | ICD-10-CM | POA: Diagnosis not present

## 2022-05-27 DIAGNOSIS — I509 Heart failure, unspecified: Secondary | ICD-10-CM | POA: Diagnosis not present

## 2022-05-27 DIAGNOSIS — Z515 Encounter for palliative care: Secondary | ICD-10-CM | POA: Diagnosis not present

## 2022-05-31 ENCOUNTER — Emergency Department (HOSPITAL_COMMUNITY)
Admission: EM | Admit: 2022-05-31 | Discharge: 2022-05-31 | Disposition: A | Discharge status: Skilled Nursing Facility | Payer: Medicare Other | Attending: Emergency Medicine | Admitting: Emergency Medicine

## 2022-05-31 ENCOUNTER — Emergency Department (HOSPITAL_COMMUNITY): Payer: Medicare Other

## 2022-05-31 ENCOUNTER — Other Ambulatory Visit: Payer: Self-pay

## 2022-05-31 ENCOUNTER — Encounter (HOSPITAL_COMMUNITY): Payer: Self-pay | Admitting: Emergency Medicine

## 2022-05-31 DIAGNOSIS — S80812A Abrasion, left lower leg, initial encounter: Secondary | ICD-10-CM | POA: Diagnosis not present

## 2022-05-31 DIAGNOSIS — S0003XA Contusion of scalp, initial encounter: Secondary | ICD-10-CM | POA: Diagnosis not present

## 2022-05-31 DIAGNOSIS — R410 Disorientation, unspecified: Secondary | ICD-10-CM | POA: Diagnosis not present

## 2022-05-31 DIAGNOSIS — R0902 Hypoxemia: Secondary | ICD-10-CM | POA: Diagnosis not present

## 2022-05-31 DIAGNOSIS — S80811A Abrasion, right lower leg, initial encounter: Secondary | ICD-10-CM | POA: Insufficient documentation

## 2022-05-31 DIAGNOSIS — W1830XA Fall on same level, unspecified, initial encounter: Secondary | ICD-10-CM | POA: Insufficient documentation

## 2022-05-31 DIAGNOSIS — Z043 Encounter for examination and observation following other accident: Secondary | ICD-10-CM | POA: Diagnosis not present

## 2022-05-31 DIAGNOSIS — F0390 Unspecified dementia without behavioral disturbance: Secondary | ICD-10-CM | POA: Diagnosis not present

## 2022-05-31 DIAGNOSIS — S0083XA Contusion of other part of head, initial encounter: Secondary | ICD-10-CM | POA: Insufficient documentation

## 2022-05-31 DIAGNOSIS — M25562 Pain in left knee: Secondary | ICD-10-CM | POA: Diagnosis not present

## 2022-05-31 DIAGNOSIS — Z7982 Long term (current) use of aspirin: Secondary | ICD-10-CM | POA: Insufficient documentation

## 2022-05-31 DIAGNOSIS — S0990XA Unspecified injury of head, initial encounter: Secondary | ICD-10-CM | POA: Diagnosis not present

## 2022-05-31 DIAGNOSIS — W19XXXA Unspecified fall, initial encounter: Secondary | ICD-10-CM | POA: Diagnosis not present

## 2022-05-31 DIAGNOSIS — I959 Hypotension, unspecified: Secondary | ICD-10-CM | POA: Diagnosis not present

## 2022-05-31 HISTORY — DX: Unspecified atrial fibrillation: I48.91

## 2022-05-31 MED ORDER — IOHEXOL 300 MG/ML  SOLN: 100.0000 mL | Freq: Once | INTRAMUSCULAR | Status: DC | PRN

## 2022-05-31 NOTE — ED Triage Notes (Signed)
Pt from [facility], staff seen pt fall onto cement. Pt has abrasions to forehead with swelling and to nose. Bleeding controlled. Staff advised ems there was no LOC. HX of dementia and hx of a fib with no blood thinners. Alert but confused. Denies falling. Right pupil 2 and reactive. Left pupil 3 and nonreactive but daughter at bedside states this has been her normal since pt had shingles in this eye in the past. Skin tear to left pinky and left thumb. Bruising noted to left knee. Palpated all over body and pt denies any pain.

## 2022-05-31 NOTE — ED Provider Notes (Signed)
[facility] EMERGENCY DEPARTMENT Provider Note   CSN: [id number] Arrival date & time: 05/31/22  1942     History {Add pertinent medical, surgical, social history, OB history to HPI:1} Chief Complaint  Patient presents with   Judith Schroeder is a 76 y.o. female.  Patient with a history of dementia.  She fell on her face when she is not walking with her walker.  No loss of consciousness   Fall       Home Medications Prior to Admission medications   Medication Sig Start Date End Date Taking? Authorizing Provider  aspirin EC 81 MG tablet Take 81 mg by mouth daily.     [provider]  cholecalciferol (VITAMIN D3) 25 MCG (1000 UT) tablet Take 1,000 Units by mouth every morning.    [provider]  docusate sodium (COLACE) 100 MG capsule Take 100 mg by mouth 2 (two) times daily.    [provider]  donepezil (ARICEPT) 10 MG tablet Take 1 tablet (10 mg total) by mouth at bedtime. 11/16/18   Melvenia Beam, MD  eplerenone (INSPRA) 25 MG tablet Take 25 mg by mouth every morning.     [provider]  furosemide (LASIX) 20 MG tablet Take 20 mg by mouth daily. Take 1 tablet by mouth in addition to scheduled dose if weight gain of 3 lbs in 1 day and or 5 lbs/ 1 week for 3 days.    [provider]  levocetirizine (XYZAL) 5 MG tablet Take 5 mg by mouth at bedtime. 01/12/20   [provider]  LORazepam (ATIVAN) 0.5 MG tablet 1 tab every 8 hours as needed for agitation/anxiety that cannot be redirected. Watch for sedation and risk of falls. Patient taking differently: Take 0.5 mg by mouth every 6 (six) hours as needed for anxiety. May take 1 tab every 6 hours as needed for agitation/anxiety that cannot be redirected. Watch for sedation and risk of falls. 09/29/18   Melvenia Beam, MD  magnesium oxide (MAG-OX) 400 MG tablet Take 400 mg by mouth daily.     [provider]  memantine (NAMENDA) 10 MG tablet TAKE (1) TABLET BY  MOUTH TWICE DAILY. Patient taking differently: Take 10 mg by mouth 2 (two) times daily. 10/15/18   Melvenia Beam, MD  metoprolol tartrate (LOPRESSOR) 25 MG tablet Take 25 mg by mouth daily.    [provider]  risperiDONE (RISPERDAL) 0.5 MG tablet Take 5 tablets (2.5 mg total) by mouth at bedtime. 10/31/21   Ward Givens, NP  rosuvastatin (CRESTOR) 5 MG tablet Take 5 mg by mouth every morning.     [provider]  sertraline (ZOLOFT) 100 MG tablet Take 1 tablet (100 mg total) by mouth daily. 02/09/20   Melvenia Beam, MD  valACYclovir (VALTREX) 1000 MG tablet Take 1 g by mouth every morning. 10/08/18   [provider]      Allergies    Atorvastatin    Review of Systems   Review of Systems  Physical Exam Updated Vital Signs BP (!) 120/52   Pulse 73   Temp 98.2 F (36.8 C)   Resp 16   SpO2 94%  Physical Exam  ED Results / Procedures / Treatments   Labs (all labs ordered are listed, but only abnormal results are displayed) Labs Reviewed - No data to display  EKG None  Radiology CT Cervical Spine Wo Contrast  Result Date: 05/31/2022 CLINICAL DATA:  Facial trauma,  blunt Fall onto cement. EXAM: CT CERVICAL SPINE WITHOUT CONTRAST TECHNIQUE: Multidetector CT imaging of the cervical spine was performed without intravenous contrast. Multiplanar CT image reconstructions were also generated. RADIATION DOSE REDUCTION: This exam was performed according to the departmental dose-optimization program which includes automated exposure control, adjustment of the mA and/or kV according to patient size and/or use of iterative reconstruction technique. COMPARISON:  CT cervical spine 09/30/2021 FINDINGS: Alignment: Stable exaggerated cervical lordosis. No traumatic malalignment. Skull base and vertebrae: Compression fracture of inferior T1 endplate is age indeterminate, but new from prior CT. Remote fracture of T2 inferior endplate in T3 superior endplate, unchanged. No  acute fracture of the cervical spine. The skull base and dens are intact. Soft tissues and spinal canal: No prevertebral fluid or swelling. No visible canal hematoma. Disc levels:  Mild diffuse degenerative disc disease. Upper chest: No acute or unexpected findings. Incidental azygous fissure. Other: None. IMPRESSION: 1. No acute fracture of the cervical spine. 2. Compression fracture of inferior T1 endplate is age indeterminate, but new from October 2022 CT. Recommend correlation for focal tenderness. Electronically Signed   By: Keith Rake M.D.   On: 05/31/2022 22:01   CT Head Wo Contrast  Result Date: 05/31/2022 CLINICAL DATA:  Facial trauma, blunt Fall onto cement. Forehead abrasions. EXAM: CT HEAD WITHOUT CONTRAST TECHNIQUE: Contiguous axial images were obtained from the base of the skull through the vertex without intravenous contrast. RADIATION DOSE REDUCTION: This exam was performed according to the departmental dose-optimization program which includes automated exposure control, adjustment of the mA and/or kV according to patient size and/or use of iterative reconstruction technique. COMPARISON:  Head CT 09/30/2021 FINDINGS: Brain: No intracranial hemorrhage, mass effect, or midline shift. Stable degree of atrophy and chronic small vessel ischemia. Small remote right occipital infarct. No hydrocephalus. The basilar cisterns are patent. No evidence of territorial infarct or acute ischemia. No extra-axial or intracranial fluid collection. Vascular: Atherosclerosis of skullbase vasculature without hyperdense vessel or abnormal calcification. Skull: No fracture or focal lesion. Sinuses/Orbits: Paranasal sinuses and mastoid air cells are clear. The visualized orbits are unremarkable. Other: Midline frontal scalp hematoma. IMPRESSION: 1. Midline frontal scalp hematoma. No acute intracranial abnormality. No skull fracture. 2. Stable atrophy and chronic small vessel ischemia. Small remote right occipital  infarct. Electronically Signed   By: Keith Rake M.D.   On: 05/31/2022 21:56   DG Pelvis 1-2 Views  Result Date: 05/31/2022 CLINICAL DATA:  Fall onto cement. EXAM: PELVIS - 1-2 VIEW COMPARISON:  None Available. FINDINGS: The cortical margins of the bony pelvis are intact. No fracture. Pubic symphysis and sacroiliac joints are congruent. Both femoral heads are well-seated in the respective acetabula. IMPRESSION: No pelvic fracture. Electronically Signed   By: Keith Rake M.D.   On: 05/31/2022 21:35   DG Knee 2 Views Left  Result Date: 05/31/2022 CLINICAL DATA:  Fall, left knee pain EXAM: LEFT KNEE - 1-2 VIEW COMPARISON:  None Available. FINDINGS: Normal alignment. No acute fracture or dislocation. Mild-to-moderate tricompartmental degenerative arthritis, most severe within the medial compartment. No effusion. Mild prepatellar soft tissue swelling. IMPRESSION: Mild prepatellar soft tissue swelling. No acute fracture or dislocation. Electronically Signed   By: Fidela Salisbury M.D.   On: 05/31/2022 21:34    Procedures Procedures  {Document cardiac monitor, telemetry assessment procedure when appropriate:1}  Medications Ordered in ED Medications  iohexol (OMNIPAQUE) 300 MG/ML solution 100 mL (has no administration in time range)    ED Course/ Medical Decision Making/ A&P  Medical Decision Making Amount and/or Complexity of Data Reviewed Radiology: ordered.  Risk Prescription drug management.   Patient with abrasions to nose and forehead.  Patient also has contusions and abrasions to both lower legs.  She will follow-up as needed  {Document critical care time when appropriate:1} {Document review of labs and clinical decision tools ie heart score, Chads2Vasc2 etc:1}  {Document your independent review of radiology images, and any outside records:1} {Document your discussion with family members, caretakers, and with consultants:1} {Document social  determinants of health affecting pt's care:1} {Document your decision making why or why not admission, treatments were needed:1} Final Clinical Impression(s) / ED Diagnoses Final diagnoses:  Contusion of face, initial encounter    Rx / DC Orders ED Discharge Orders     None

## 2022-05-31 NOTE — Discharge Instructions (Signed)
Clean laceration twice a day with soap and water.  Take Tylenol for pain.  Follow-up as needed

## 2022-05-31 NOTE — ED Notes (Signed)
Patient turned and repositioned in bed, applied xeroform , gauze and bandage to head.

## 2022-06-05 DIAGNOSIS — N1832 Chronic kidney disease, stage 3b: Secondary | ICD-10-CM | POA: Diagnosis not present

## 2022-06-05 DIAGNOSIS — G301 Alzheimer's disease with late onset: Secondary | ICD-10-CM | POA: Diagnosis not present

## 2022-06-05 DIAGNOSIS — I5030 Unspecified diastolic (congestive) heart failure: Secondary | ICD-10-CM | POA: Diagnosis not present

## 2022-06-14 DIAGNOSIS — N183 Chronic kidney disease, stage 3 unspecified: Secondary | ICD-10-CM | POA: Diagnosis not present

## 2022-06-14 DIAGNOSIS — I5030 Unspecified diastolic (congestive) heart failure: Secondary | ICD-10-CM | POA: Diagnosis not present

## 2022-06-14 DIAGNOSIS — F02811 Dementia in other diseases classified elsewhere, unspecified severity, with agitation: Secondary | ICD-10-CM | POA: Diagnosis not present

## 2022-06-28 DIAGNOSIS — G309 Alzheimer's disease, unspecified: Secondary | ICD-10-CM | POA: Diagnosis not present

## 2022-06-28 DIAGNOSIS — I509 Heart failure, unspecified: Secondary | ICD-10-CM | POA: Diagnosis not present

## 2022-06-28 DIAGNOSIS — Z515 Encounter for palliative care: Secondary | ICD-10-CM | POA: Diagnosis not present

## 2022-07-26 DIAGNOSIS — G309 Alzheimer's disease, unspecified: Secondary | ICD-10-CM | POA: Diagnosis not present

## 2022-07-26 DIAGNOSIS — I509 Heart failure, unspecified: Secondary | ICD-10-CM | POA: Diagnosis not present

## 2022-07-26 DIAGNOSIS — Z515 Encounter for palliative care: Secondary | ICD-10-CM | POA: Diagnosis not present

## 2022-08-16 DIAGNOSIS — G309 Alzheimer's disease, unspecified: Secondary | ICD-10-CM | POA: Diagnosis not present

## 2022-08-16 DIAGNOSIS — F419 Anxiety disorder, unspecified: Secondary | ICD-10-CM | POA: Diagnosis not present

## 2022-08-29 DIAGNOSIS — N3000 Acute cystitis without hematuria: Secondary | ICD-10-CM | POA: Diagnosis not present

## 2022-08-30 DIAGNOSIS — Z515 Encounter for palliative care: Secondary | ICD-10-CM | POA: Diagnosis not present

## 2022-08-30 DIAGNOSIS — I509 Heart failure, unspecified: Secondary | ICD-10-CM | POA: Diagnosis not present

## 2022-08-30 DIAGNOSIS — G309 Alzheimer's disease, unspecified: Secondary | ICD-10-CM | POA: Diagnosis not present

## 2022-09-06 DIAGNOSIS — F02811 Dementia in other diseases classified elsewhere, unspecified severity, with agitation: Secondary | ICD-10-CM | POA: Diagnosis not present

## 2022-09-06 DIAGNOSIS — I10 Essential (primary) hypertension: Secondary | ICD-10-CM | POA: Diagnosis not present

## 2022-09-06 DIAGNOSIS — I5032 Chronic diastolic (congestive) heart failure: Secondary | ICD-10-CM | POA: Diagnosis not present

## 2022-09-23 DIAGNOSIS — Z23 Encounter for immunization: Secondary | ICD-10-CM | POA: Diagnosis not present

## 2022-10-11 DIAGNOSIS — I509 Heart failure, unspecified: Secondary | ICD-10-CM | POA: Diagnosis not present

## 2022-10-11 DIAGNOSIS — G309 Alzheimer's disease, unspecified: Secondary | ICD-10-CM | POA: Diagnosis not present

## 2022-10-11 DIAGNOSIS — Z515 Encounter for palliative care: Secondary | ICD-10-CM | POA: Diagnosis not present

## 2022-11-11 DIAGNOSIS — Z515 Encounter for palliative care: Secondary | ICD-10-CM | POA: Diagnosis not present

## 2022-11-11 DIAGNOSIS — G309 Alzheimer's disease, unspecified: Secondary | ICD-10-CM | POA: Diagnosis not present

## 2022-11-11 DIAGNOSIS — I509 Heart failure, unspecified: Secondary | ICD-10-CM | POA: Diagnosis not present

## 2022-12-10 DIAGNOSIS — G309 Alzheimer's disease, unspecified: Secondary | ICD-10-CM | POA: Diagnosis not present

## 2022-12-10 DIAGNOSIS — I509 Heart failure, unspecified: Secondary | ICD-10-CM | POA: Diagnosis not present

## 2022-12-10 DIAGNOSIS — Z515 Encounter for palliative care: Secondary | ICD-10-CM | POA: Diagnosis not present

## 2022-12-12 IMAGING — CT CT HEAD W/O CM
4 series · 16 of 47 positions shown, 18 images · non-contrast
Comparison: Head CT 09/30/2021

CLINICAL DATA: Facial trauma, blunt

Fall onto cement. Forehead abrasions.
EXAM:
CT HEAD WITHOUT CONTRAST
TECHNIQUE: Contiguous axial images were obtained from the base of the skull
through the vertex without intravenous contrast.
RADIATION DOSE REDUCTION: This exam was performed according to the
departmental dose-optimization program which includes automated
exposure control, adjustment of the mA and/or kV according to
patient size and/or use of iterative reconstruction technique.

[Series 2: head w o · axial · 0.43mm/px · z∈[-13,+107]mm · 7 of 34 slices shown, 9 images]
[im 5/34  brain]
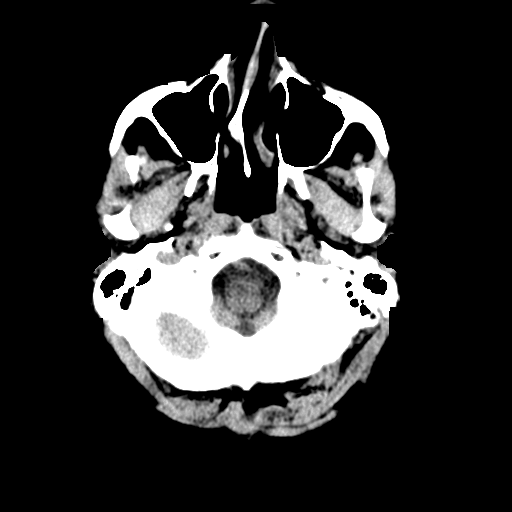
[im 5/34  bone]
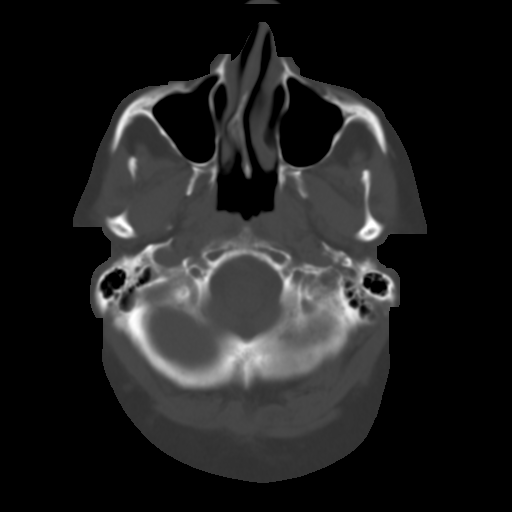
[im 9/34  brain]
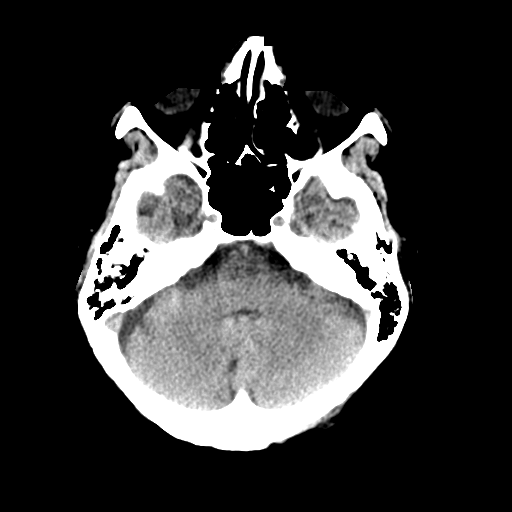
[im 13/34  brain]
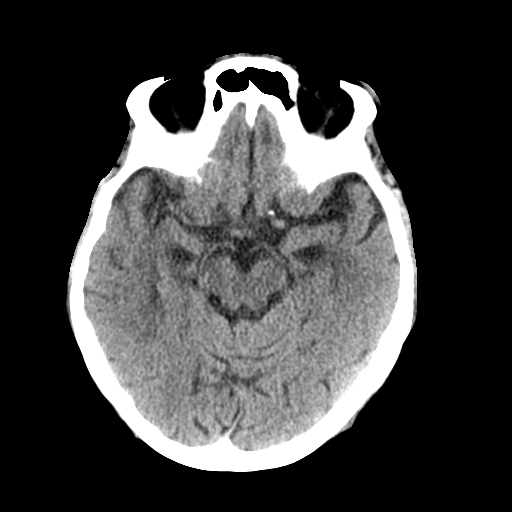
[im 17/34  brain]
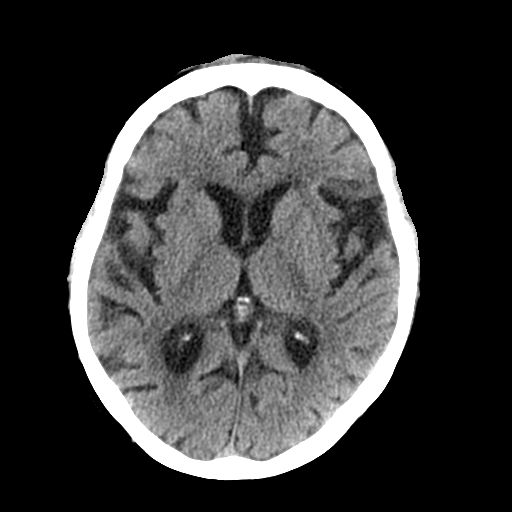
[im 21/34  brain]
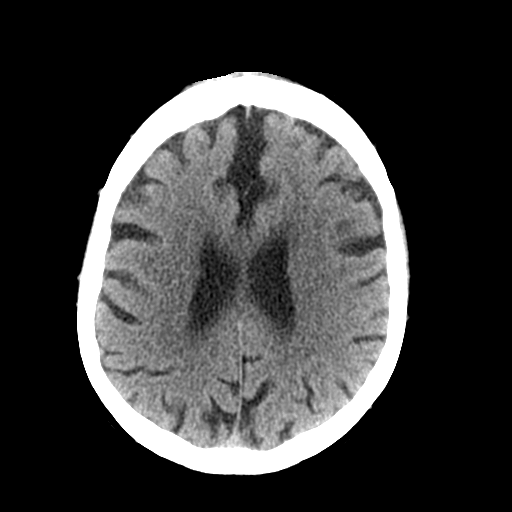
[im 21/34  bone]
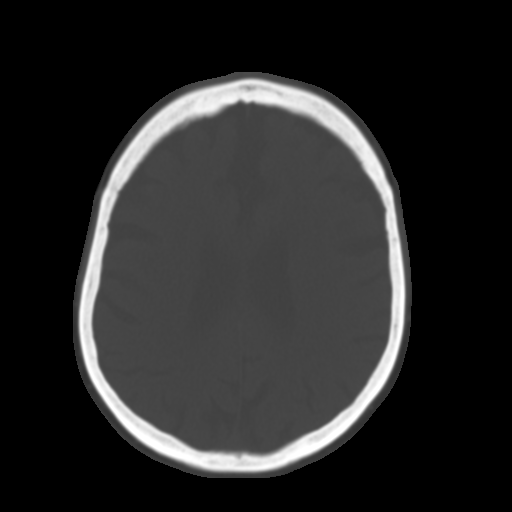
[im 25/34  brain]
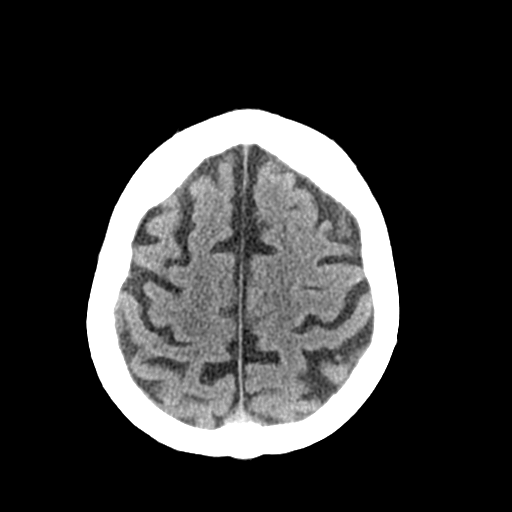
[im 29/34  brain]
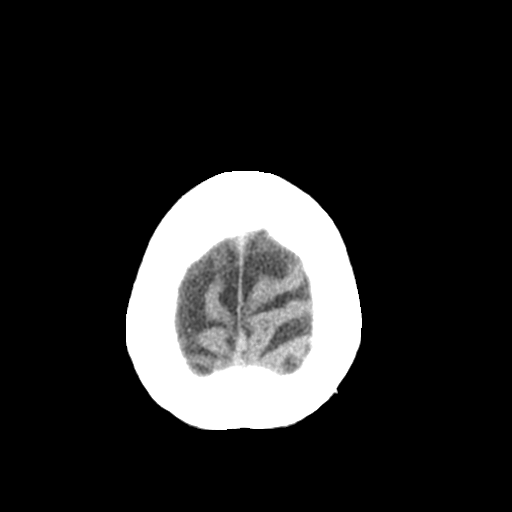

[Series 3: head bone · axial · 0.43mm/px · z∈[-17,+15]mm · 3 of 84 slices shown]
[im 9/84  bone]
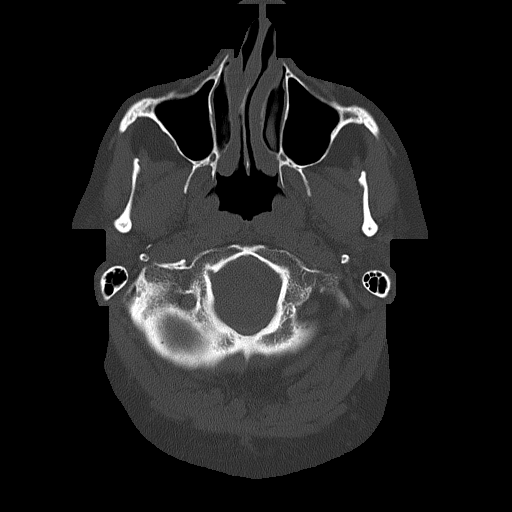
[im 17/84  bone]
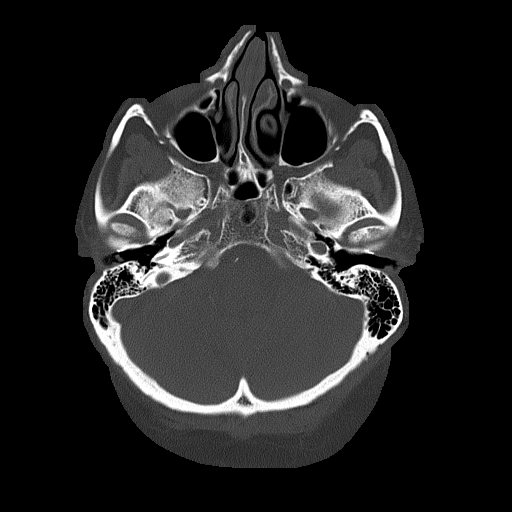
[im 25/84  bone]
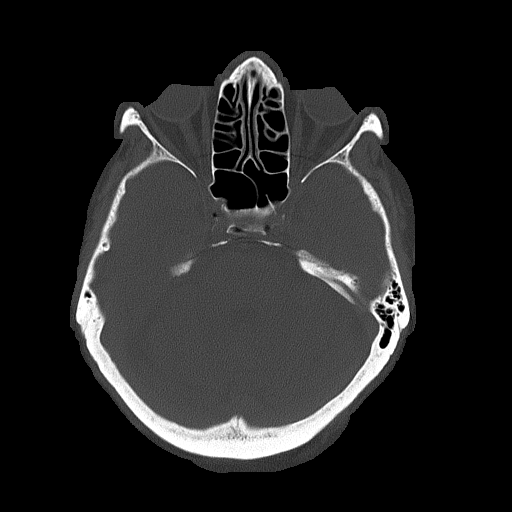

[Series 4: coronal soft · coronal · 0.35mm/px · 3 of 74 slices shown]
[im 25/74  brain]
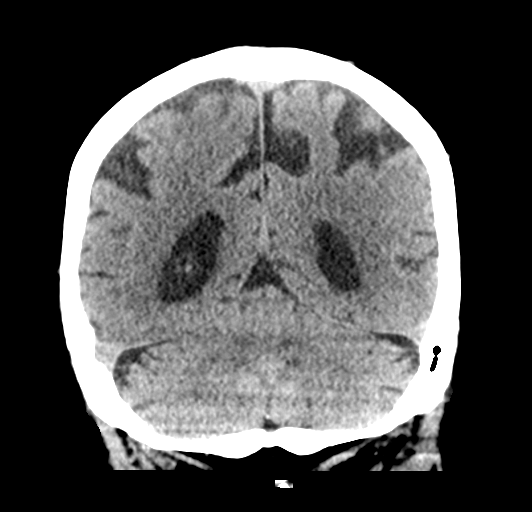
[im 33/74  brain]
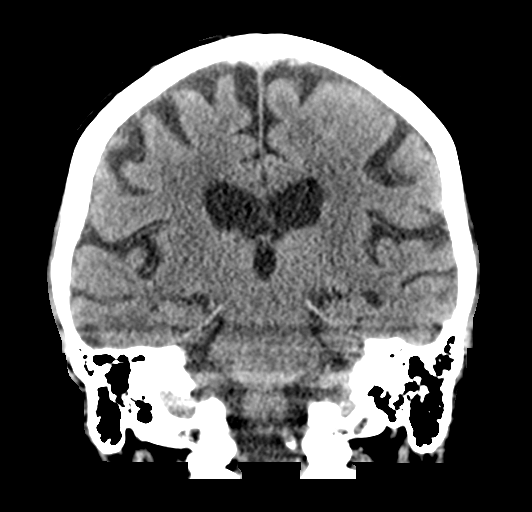
[im 41/74  brain]
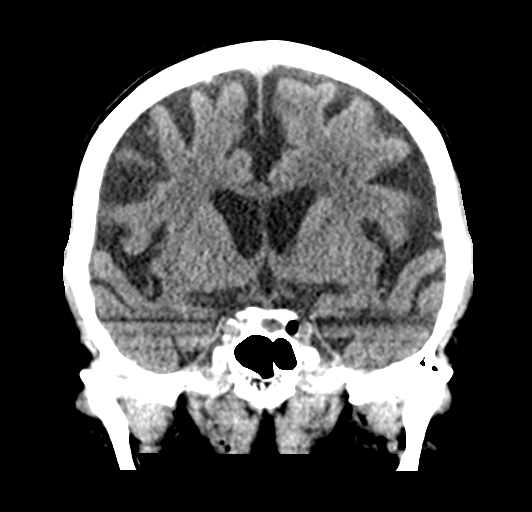

[Series 5: sagittal soft · sagittal · 0.32mm/px · 3 of 57 slices shown]
[im 19/57  brain]
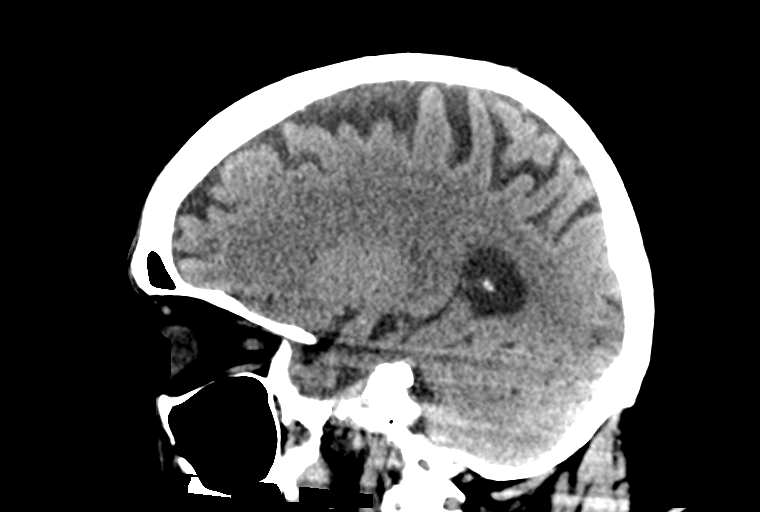
[im 29/57  brain]
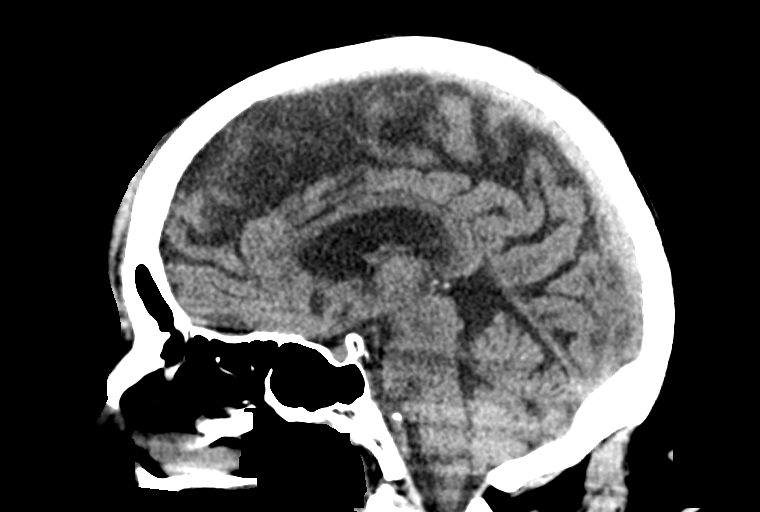
[im 38/57  brain]
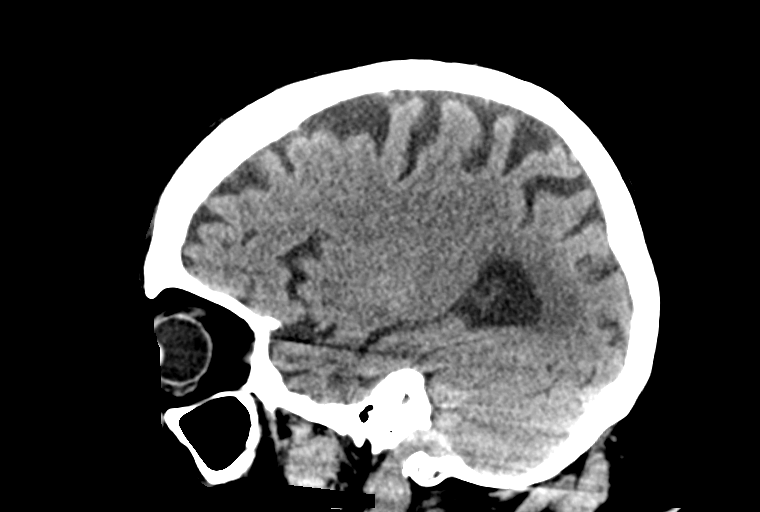

[16 of 47 positions shown; findings below may reference images not displayed]

FINDINGS: Brain: No intracranial hemorrhage, mass effect, or midline shift.
Stable degree of atrophy and chronic small vessel ischemia. Small
remote right occipital infarct. No hydrocephalus. The basilar
cisterns are patent. No evidence of territorial infarct or acute
ischemia. No extra-axial or intracranial fluid collection.

Vascular: Atherosclerosis of skullbase vasculature without
hyperdense vessel or abnormal calcification.

Skull: No fracture or focal lesion.

Sinuses/Orbits: Paranasal sinuses and mastoid air cells are clear.
The visualized orbits are unremarkable.

Other: Midline frontal scalp hematoma.
IMPRESSION: 1. Midline frontal scalp hematoma. No acute intracranial
abnormality. No skull fracture.
2. Stable atrophy and chronic small vessel ischemia. Small remote
right occipital infarct.

## 2023-01-03 DIAGNOSIS — I5032 Chronic diastolic (congestive) heart failure: Secondary | ICD-10-CM | POA: Diagnosis not present

## 2023-01-03 DIAGNOSIS — F02811 Dementia in other diseases classified elsewhere, unspecified severity, with agitation: Secondary | ICD-10-CM | POA: Diagnosis not present

## 2023-01-17 DIAGNOSIS — I509 Heart failure, unspecified: Secondary | ICD-10-CM | POA: Diagnosis not present

## 2023-01-17 DIAGNOSIS — G309 Alzheimer's disease, unspecified: Secondary | ICD-10-CM | POA: Diagnosis not present

## 2023-01-17 DIAGNOSIS — Z515 Encounter for palliative care: Secondary | ICD-10-CM | POA: Diagnosis not present

## 2023-01-18 DIAGNOSIS — G301 Alzheimer's disease with late onset: Secondary | ICD-10-CM | POA: Diagnosis not present

## 2023-01-18 DIAGNOSIS — F32A Depression, unspecified: Secondary | ICD-10-CM | POA: Diagnosis not present

## 2023-01-18 DIAGNOSIS — I10 Essential (primary) hypertension: Secondary | ICD-10-CM | POA: Diagnosis not present

## 2023-01-18 DIAGNOSIS — N1832 Chronic kidney disease, stage 3b: Secondary | ICD-10-CM | POA: Diagnosis not present

## 2023-01-18 DIAGNOSIS — I509 Heart failure, unspecified: Secondary | ICD-10-CM | POA: Diagnosis not present

## 2023-01-18 DIAGNOSIS — G4700 Insomnia, unspecified: Secondary | ICD-10-CM | POA: Diagnosis not present

## 2023-01-18 DIAGNOSIS — E782 Mixed hyperlipidemia: Secondary | ICD-10-CM | POA: Diagnosis not present

## 2023-01-18 DIAGNOSIS — C541 Malignant neoplasm of endometrium: Secondary | ICD-10-CM | POA: Diagnosis not present

## 2023-01-18 DIAGNOSIS — G311 Senile degeneration of brain, not elsewhere classified: Secondary | ICD-10-CM | POA: Diagnosis not present

## 2023-01-20 DIAGNOSIS — G311 Senile degeneration of brain, not elsewhere classified: Secondary | ICD-10-CM | POA: Diagnosis not present

## 2023-01-20 DIAGNOSIS — E782 Mixed hyperlipidemia: Secondary | ICD-10-CM | POA: Diagnosis not present

## 2023-01-20 DIAGNOSIS — N1832 Chronic kidney disease, stage 3b: Secondary | ICD-10-CM | POA: Diagnosis not present

## 2023-01-20 DIAGNOSIS — I10 Essential (primary) hypertension: Secondary | ICD-10-CM | POA: Diagnosis not present

## 2023-01-20 DIAGNOSIS — C541 Malignant neoplasm of endometrium: Secondary | ICD-10-CM | POA: Diagnosis not present

## 2023-01-20 DIAGNOSIS — G301 Alzheimer's disease with late onset: Secondary | ICD-10-CM | POA: Diagnosis not present

## 2023-01-21 DIAGNOSIS — C541 Malignant neoplasm of endometrium: Secondary | ICD-10-CM | POA: Diagnosis not present

## 2023-01-21 DIAGNOSIS — I10 Essential (primary) hypertension: Secondary | ICD-10-CM | POA: Diagnosis not present

## 2023-01-21 DIAGNOSIS — E782 Mixed hyperlipidemia: Secondary | ICD-10-CM | POA: Diagnosis not present

## 2023-01-21 DIAGNOSIS — G301 Alzheimer's disease with late onset: Secondary | ICD-10-CM | POA: Diagnosis not present

## 2023-01-21 DIAGNOSIS — N1832 Chronic kidney disease, stage 3b: Secondary | ICD-10-CM | POA: Diagnosis not present

## 2023-01-21 DIAGNOSIS — G311 Senile degeneration of brain, not elsewhere classified: Secondary | ICD-10-CM | POA: Diagnosis not present

## 2023-01-27 DIAGNOSIS — C541 Malignant neoplasm of endometrium: Secondary | ICD-10-CM | POA: Diagnosis not present

## 2023-01-27 DIAGNOSIS — G301 Alzheimer's disease with late onset: Secondary | ICD-10-CM | POA: Diagnosis not present

## 2023-01-27 DIAGNOSIS — I10 Essential (primary) hypertension: Secondary | ICD-10-CM | POA: Diagnosis not present

## 2023-01-27 DIAGNOSIS — G311 Senile degeneration of brain, not elsewhere classified: Secondary | ICD-10-CM | POA: Diagnosis not present

## 2023-01-27 DIAGNOSIS — N1832 Chronic kidney disease, stage 3b: Secondary | ICD-10-CM | POA: Diagnosis not present

## 2023-01-27 DIAGNOSIS — E782 Mixed hyperlipidemia: Secondary | ICD-10-CM | POA: Diagnosis not present

## 2023-01-31 DIAGNOSIS — I10 Essential (primary) hypertension: Secondary | ICD-10-CM | POA: Diagnosis not present

## 2023-01-31 DIAGNOSIS — C541 Malignant neoplasm of endometrium: Secondary | ICD-10-CM | POA: Diagnosis not present

## 2023-01-31 DIAGNOSIS — G301 Alzheimer's disease with late onset: Secondary | ICD-10-CM | POA: Diagnosis not present

## 2023-01-31 DIAGNOSIS — E782 Mixed hyperlipidemia: Secondary | ICD-10-CM | POA: Diagnosis not present

## 2023-01-31 DIAGNOSIS — N1832 Chronic kidney disease, stage 3b: Secondary | ICD-10-CM | POA: Diagnosis not present

## 2023-01-31 DIAGNOSIS — G311 Senile degeneration of brain, not elsewhere classified: Secondary | ICD-10-CM | POA: Diagnosis not present

## 2023-02-07 DIAGNOSIS — I10 Essential (primary) hypertension: Secondary | ICD-10-CM | POA: Diagnosis not present

## 2023-02-07 DIAGNOSIS — G301 Alzheimer's disease with late onset: Secondary | ICD-10-CM | POA: Diagnosis not present

## 2023-02-07 DIAGNOSIS — C541 Malignant neoplasm of endometrium: Secondary | ICD-10-CM | POA: Diagnosis not present

## 2023-02-07 DIAGNOSIS — N1832 Chronic kidney disease, stage 3b: Secondary | ICD-10-CM | POA: Diagnosis not present

## 2023-02-07 DIAGNOSIS — G311 Senile degeneration of brain, not elsewhere classified: Secondary | ICD-10-CM | POA: Diagnosis not present

## 2023-02-07 DIAGNOSIS — E782 Mixed hyperlipidemia: Secondary | ICD-10-CM | POA: Diagnosis not present

## 2023-02-11 DIAGNOSIS — N1832 Chronic kidney disease, stage 3b: Secondary | ICD-10-CM | POA: Diagnosis not present

## 2023-02-11 DIAGNOSIS — G301 Alzheimer's disease with late onset: Secondary | ICD-10-CM | POA: Diagnosis not present

## 2023-02-11 DIAGNOSIS — C541 Malignant neoplasm of endometrium: Secondary | ICD-10-CM | POA: Diagnosis not present

## 2023-02-11 DIAGNOSIS — G311 Senile degeneration of brain, not elsewhere classified: Secondary | ICD-10-CM | POA: Diagnosis not present

## 2023-02-11 DIAGNOSIS — E782 Mixed hyperlipidemia: Secondary | ICD-10-CM | POA: Diagnosis not present

## 2023-02-11 DIAGNOSIS — I10 Essential (primary) hypertension: Secondary | ICD-10-CM | POA: Diagnosis not present

## 2023-02-14 DIAGNOSIS — G4700 Insomnia, unspecified: Secondary | ICD-10-CM | POA: Diagnosis not present

## 2023-02-14 DIAGNOSIS — C541 Malignant neoplasm of endometrium: Secondary | ICD-10-CM | POA: Diagnosis not present

## 2023-02-14 DIAGNOSIS — N1832 Chronic kidney disease, stage 3b: Secondary | ICD-10-CM | POA: Diagnosis not present

## 2023-02-14 DIAGNOSIS — I509 Heart failure, unspecified: Secondary | ICD-10-CM | POA: Diagnosis not present

## 2023-02-14 DIAGNOSIS — G301 Alzheimer's disease with late onset: Secondary | ICD-10-CM | POA: Diagnosis not present

## 2023-02-14 DIAGNOSIS — G311 Senile degeneration of brain, not elsewhere classified: Secondary | ICD-10-CM | POA: Diagnosis not present

## 2023-02-14 DIAGNOSIS — F32A Depression, unspecified: Secondary | ICD-10-CM | POA: Diagnosis not present

## 2023-02-14 DIAGNOSIS — I10 Essential (primary) hypertension: Secondary | ICD-10-CM | POA: Diagnosis not present

## 2023-02-14 DIAGNOSIS — E782 Mixed hyperlipidemia: Secondary | ICD-10-CM | POA: Diagnosis not present

## 2023-02-21 DIAGNOSIS — E782 Mixed hyperlipidemia: Secondary | ICD-10-CM | POA: Diagnosis not present

## 2023-02-21 DIAGNOSIS — G301 Alzheimer's disease with late onset: Secondary | ICD-10-CM | POA: Diagnosis not present

## 2023-02-21 DIAGNOSIS — N1832 Chronic kidney disease, stage 3b: Secondary | ICD-10-CM | POA: Diagnosis not present

## 2023-02-21 DIAGNOSIS — I10 Essential (primary) hypertension: Secondary | ICD-10-CM | POA: Diagnosis not present

## 2023-02-21 DIAGNOSIS — C541 Malignant neoplasm of endometrium: Secondary | ICD-10-CM | POA: Diagnosis not present

## 2023-02-21 DIAGNOSIS — G311 Senile degeneration of brain, not elsewhere classified: Secondary | ICD-10-CM | POA: Diagnosis not present

## 2023-02-26 DIAGNOSIS — E782 Mixed hyperlipidemia: Secondary | ICD-10-CM | POA: Diagnosis not present

## 2023-02-26 DIAGNOSIS — G311 Senile degeneration of brain, not elsewhere classified: Secondary | ICD-10-CM | POA: Diagnosis not present

## 2023-02-26 DIAGNOSIS — C541 Malignant neoplasm of endometrium: Secondary | ICD-10-CM | POA: Diagnosis not present

## 2023-02-26 DIAGNOSIS — G301 Alzheimer's disease with late onset: Secondary | ICD-10-CM | POA: Diagnosis not present

## 2023-02-26 DIAGNOSIS — I10 Essential (primary) hypertension: Secondary | ICD-10-CM | POA: Diagnosis not present

## 2023-02-26 DIAGNOSIS — N1832 Chronic kidney disease, stage 3b: Secondary | ICD-10-CM | POA: Diagnosis not present

## 2023-02-28 DIAGNOSIS — G301 Alzheimer's disease with late onset: Secondary | ICD-10-CM | POA: Diagnosis not present

## 2023-02-28 DIAGNOSIS — I10 Essential (primary) hypertension: Secondary | ICD-10-CM | POA: Diagnosis not present

## 2023-02-28 DIAGNOSIS — E782 Mixed hyperlipidemia: Secondary | ICD-10-CM | POA: Diagnosis not present

## 2023-02-28 DIAGNOSIS — N1832 Chronic kidney disease, stage 3b: Secondary | ICD-10-CM | POA: Diagnosis not present

## 2023-02-28 DIAGNOSIS — G311 Senile degeneration of brain, not elsewhere classified: Secondary | ICD-10-CM | POA: Diagnosis not present

## 2023-02-28 DIAGNOSIS — C541 Malignant neoplasm of endometrium: Secondary | ICD-10-CM | POA: Diagnosis not present

## 2023-03-03 DIAGNOSIS — G311 Senile degeneration of brain, not elsewhere classified: Secondary | ICD-10-CM | POA: Diagnosis not present

## 2023-03-03 DIAGNOSIS — G301 Alzheimer's disease with late onset: Secondary | ICD-10-CM | POA: Diagnosis not present

## 2023-03-03 DIAGNOSIS — E782 Mixed hyperlipidemia: Secondary | ICD-10-CM | POA: Diagnosis not present

## 2023-03-03 DIAGNOSIS — I10 Essential (primary) hypertension: Secondary | ICD-10-CM | POA: Diagnosis not present

## 2023-03-03 DIAGNOSIS — C541 Malignant neoplasm of endometrium: Secondary | ICD-10-CM | POA: Diagnosis not present

## 2023-03-03 DIAGNOSIS — N1832 Chronic kidney disease, stage 3b: Secondary | ICD-10-CM | POA: Diagnosis not present

## 2023-03-10 DIAGNOSIS — N1832 Chronic kidney disease, stage 3b: Secondary | ICD-10-CM | POA: Diagnosis not present

## 2023-03-10 DIAGNOSIS — I10 Essential (primary) hypertension: Secondary | ICD-10-CM | POA: Diagnosis not present

## 2023-03-10 DIAGNOSIS — E782 Mixed hyperlipidemia: Secondary | ICD-10-CM | POA: Diagnosis not present

## 2023-03-10 DIAGNOSIS — C541 Malignant neoplasm of endometrium: Secondary | ICD-10-CM | POA: Diagnosis not present

## 2023-03-10 DIAGNOSIS — G301 Alzheimer's disease with late onset: Secondary | ICD-10-CM | POA: Diagnosis not present

## 2023-03-10 DIAGNOSIS — G311 Senile degeneration of brain, not elsewhere classified: Secondary | ICD-10-CM | POA: Diagnosis not present

## 2023-03-13 DIAGNOSIS — E782 Mixed hyperlipidemia: Secondary | ICD-10-CM | POA: Diagnosis not present

## 2023-03-13 DIAGNOSIS — G311 Senile degeneration of brain, not elsewhere classified: Secondary | ICD-10-CM | POA: Diagnosis not present

## 2023-03-13 DIAGNOSIS — I10 Essential (primary) hypertension: Secondary | ICD-10-CM | POA: Diagnosis not present

## 2023-03-13 DIAGNOSIS — C541 Malignant neoplasm of endometrium: Secondary | ICD-10-CM | POA: Diagnosis not present

## 2023-03-13 DIAGNOSIS — G301 Alzheimer's disease with late onset: Secondary | ICD-10-CM | POA: Diagnosis not present

## 2023-03-13 DIAGNOSIS — N1832 Chronic kidney disease, stage 3b: Secondary | ICD-10-CM | POA: Diagnosis not present

## 2023-03-17 DIAGNOSIS — G311 Senile degeneration of brain, not elsewhere classified: Secondary | ICD-10-CM | POA: Diagnosis not present

## 2023-03-17 DIAGNOSIS — F32A Depression, unspecified: Secondary | ICD-10-CM | POA: Diagnosis not present

## 2023-03-17 DIAGNOSIS — C541 Malignant neoplasm of endometrium: Secondary | ICD-10-CM | POA: Diagnosis not present

## 2023-03-17 DIAGNOSIS — E782 Mixed hyperlipidemia: Secondary | ICD-10-CM | POA: Diagnosis not present

## 2023-03-17 DIAGNOSIS — G301 Alzheimer's disease with late onset: Secondary | ICD-10-CM | POA: Diagnosis not present

## 2023-03-17 DIAGNOSIS — I509 Heart failure, unspecified: Secondary | ICD-10-CM | POA: Diagnosis not present

## 2023-03-17 DIAGNOSIS — G4700 Insomnia, unspecified: Secondary | ICD-10-CM | POA: Diagnosis not present

## 2023-03-17 DIAGNOSIS — I10 Essential (primary) hypertension: Secondary | ICD-10-CM | POA: Diagnosis not present

## 2023-03-17 DIAGNOSIS — N1832 Chronic kidney disease, stage 3b: Secondary | ICD-10-CM | POA: Diagnosis not present

## 2023-03-21 DIAGNOSIS — G301 Alzheimer's disease with late onset: Secondary | ICD-10-CM | POA: Diagnosis not present

## 2023-03-21 DIAGNOSIS — G311 Senile degeneration of brain, not elsewhere classified: Secondary | ICD-10-CM | POA: Diagnosis not present

## 2023-03-21 DIAGNOSIS — I10 Essential (primary) hypertension: Secondary | ICD-10-CM | POA: Diagnosis not present

## 2023-03-21 DIAGNOSIS — C541 Malignant neoplasm of endometrium: Secondary | ICD-10-CM | POA: Diagnosis not present

## 2023-03-21 DIAGNOSIS — E782 Mixed hyperlipidemia: Secondary | ICD-10-CM | POA: Diagnosis not present

## 2023-03-21 DIAGNOSIS — N1832 Chronic kidney disease, stage 3b: Secondary | ICD-10-CM | POA: Diagnosis not present

## 2023-03-28 DIAGNOSIS — G311 Senile degeneration of brain, not elsewhere classified: Secondary | ICD-10-CM | POA: Diagnosis not present

## 2023-03-28 DIAGNOSIS — N1832 Chronic kidney disease, stage 3b: Secondary | ICD-10-CM | POA: Diagnosis not present

## 2023-03-28 DIAGNOSIS — E782 Mixed hyperlipidemia: Secondary | ICD-10-CM | POA: Diagnosis not present

## 2023-03-28 DIAGNOSIS — I10 Essential (primary) hypertension: Secondary | ICD-10-CM | POA: Diagnosis not present

## 2023-03-28 DIAGNOSIS — C541 Malignant neoplasm of endometrium: Secondary | ICD-10-CM | POA: Diagnosis not present

## 2023-03-28 DIAGNOSIS — G301 Alzheimer's disease with late onset: Secondary | ICD-10-CM | POA: Diagnosis not present

## 2023-04-04 DIAGNOSIS — C541 Malignant neoplasm of endometrium: Secondary | ICD-10-CM | POA: Diagnosis not present

## 2023-04-04 DIAGNOSIS — G301 Alzheimer's disease with late onset: Secondary | ICD-10-CM | POA: Diagnosis not present

## 2023-04-04 DIAGNOSIS — E782 Mixed hyperlipidemia: Secondary | ICD-10-CM | POA: Diagnosis not present

## 2023-04-04 DIAGNOSIS — N1832 Chronic kidney disease, stage 3b: Secondary | ICD-10-CM | POA: Diagnosis not present

## 2023-04-04 DIAGNOSIS — I10 Essential (primary) hypertension: Secondary | ICD-10-CM | POA: Diagnosis not present

## 2023-04-04 DIAGNOSIS — G311 Senile degeneration of brain, not elsewhere classified: Secondary | ICD-10-CM | POA: Diagnosis not present

## 2023-04-11 DIAGNOSIS — F02811 Dementia in other diseases classified elsewhere, unspecified severity, with agitation: Secondary | ICD-10-CM | POA: Diagnosis not present

## 2023-04-11 DIAGNOSIS — E782 Mixed hyperlipidemia: Secondary | ICD-10-CM | POA: Diagnosis not present

## 2023-04-11 DIAGNOSIS — N1832 Chronic kidney disease, stage 3b: Secondary | ICD-10-CM | POA: Diagnosis not present

## 2023-04-11 DIAGNOSIS — G301 Alzheimer's disease with late onset: Secondary | ICD-10-CM | POA: Diagnosis not present

## 2023-04-11 DIAGNOSIS — C541 Malignant neoplasm of endometrium: Secondary | ICD-10-CM | POA: Diagnosis not present

## 2023-04-11 DIAGNOSIS — I5032 Chronic diastolic (congestive) heart failure: Secondary | ICD-10-CM | POA: Diagnosis not present

## 2023-04-11 DIAGNOSIS — G311 Senile degeneration of brain, not elsewhere classified: Secondary | ICD-10-CM | POA: Diagnosis not present

## 2023-04-11 DIAGNOSIS — I10 Essential (primary) hypertension: Secondary | ICD-10-CM | POA: Diagnosis not present

## 2023-04-16 DIAGNOSIS — C541 Malignant neoplasm of endometrium: Secondary | ICD-10-CM | POA: Diagnosis not present

## 2023-04-16 DIAGNOSIS — N1832 Chronic kidney disease, stage 3b: Secondary | ICD-10-CM | POA: Diagnosis not present

## 2023-04-16 DIAGNOSIS — I509 Heart failure, unspecified: Secondary | ICD-10-CM | POA: Diagnosis not present

## 2023-04-16 DIAGNOSIS — F32A Depression, unspecified: Secondary | ICD-10-CM | POA: Diagnosis not present

## 2023-04-16 DIAGNOSIS — I10 Essential (primary) hypertension: Secondary | ICD-10-CM | POA: Diagnosis not present

## 2023-04-16 DIAGNOSIS — G301 Alzheimer's disease with late onset: Secondary | ICD-10-CM | POA: Diagnosis not present

## 2023-04-16 DIAGNOSIS — E782 Mixed hyperlipidemia: Secondary | ICD-10-CM | POA: Diagnosis not present

## 2023-04-16 DIAGNOSIS — G311 Senile degeneration of brain, not elsewhere classified: Secondary | ICD-10-CM | POA: Diagnosis not present

## 2023-04-16 DIAGNOSIS — G4700 Insomnia, unspecified: Secondary | ICD-10-CM | POA: Diagnosis not present

## 2023-04-18 DIAGNOSIS — E782 Mixed hyperlipidemia: Secondary | ICD-10-CM | POA: Diagnosis not present

## 2023-04-18 DIAGNOSIS — C541 Malignant neoplasm of endometrium: Secondary | ICD-10-CM | POA: Diagnosis not present

## 2023-04-18 DIAGNOSIS — G301 Alzheimer's disease with late onset: Secondary | ICD-10-CM | POA: Diagnosis not present

## 2023-04-18 DIAGNOSIS — N1832 Chronic kidney disease, stage 3b: Secondary | ICD-10-CM | POA: Diagnosis not present

## 2023-04-18 DIAGNOSIS — G311 Senile degeneration of brain, not elsewhere classified: Secondary | ICD-10-CM | POA: Diagnosis not present

## 2023-04-18 DIAGNOSIS — I10 Essential (primary) hypertension: Secondary | ICD-10-CM | POA: Diagnosis not present

## 2023-04-25 DIAGNOSIS — C541 Malignant neoplasm of endometrium: Secondary | ICD-10-CM | POA: Diagnosis not present

## 2023-04-25 DIAGNOSIS — G311 Senile degeneration of brain, not elsewhere classified: Secondary | ICD-10-CM | POA: Diagnosis not present

## 2023-04-25 DIAGNOSIS — I10 Essential (primary) hypertension: Secondary | ICD-10-CM | POA: Diagnosis not present

## 2023-04-25 DIAGNOSIS — N1832 Chronic kidney disease, stage 3b: Secondary | ICD-10-CM | POA: Diagnosis not present

## 2023-04-25 DIAGNOSIS — G301 Alzheimer's disease with late onset: Secondary | ICD-10-CM | POA: Diagnosis not present

## 2023-04-25 DIAGNOSIS — E782 Mixed hyperlipidemia: Secondary | ICD-10-CM | POA: Diagnosis not present

## 2023-05-02 DIAGNOSIS — C541 Malignant neoplasm of endometrium: Secondary | ICD-10-CM | POA: Diagnosis not present

## 2023-05-02 DIAGNOSIS — G311 Senile degeneration of brain, not elsewhere classified: Secondary | ICD-10-CM | POA: Diagnosis not present

## 2023-05-02 DIAGNOSIS — N1832 Chronic kidney disease, stage 3b: Secondary | ICD-10-CM | POA: Diagnosis not present

## 2023-05-02 DIAGNOSIS — I10 Essential (primary) hypertension: Secondary | ICD-10-CM | POA: Diagnosis not present

## 2023-05-02 DIAGNOSIS — G301 Alzheimer's disease with late onset: Secondary | ICD-10-CM | POA: Diagnosis not present

## 2023-05-02 DIAGNOSIS — E782 Mixed hyperlipidemia: Secondary | ICD-10-CM | POA: Diagnosis not present

## 2023-05-05 DIAGNOSIS — N1832 Chronic kidney disease, stage 3b: Secondary | ICD-10-CM | POA: Diagnosis not present

## 2023-05-05 DIAGNOSIS — G311 Senile degeneration of brain, not elsewhere classified: Secondary | ICD-10-CM | POA: Diagnosis not present

## 2023-05-05 DIAGNOSIS — I10 Essential (primary) hypertension: Secondary | ICD-10-CM | POA: Diagnosis not present

## 2023-05-05 DIAGNOSIS — E782 Mixed hyperlipidemia: Secondary | ICD-10-CM | POA: Diagnosis not present

## 2023-05-05 DIAGNOSIS — G301 Alzheimer's disease with late onset: Secondary | ICD-10-CM | POA: Diagnosis not present

## 2023-05-05 DIAGNOSIS — C541 Malignant neoplasm of endometrium: Secondary | ICD-10-CM | POA: Diagnosis not present

## 2023-05-09 DIAGNOSIS — I10 Essential (primary) hypertension: Secondary | ICD-10-CM | POA: Diagnosis not present

## 2023-05-09 DIAGNOSIS — N1832 Chronic kidney disease, stage 3b: Secondary | ICD-10-CM | POA: Diagnosis not present

## 2023-05-09 DIAGNOSIS — C541 Malignant neoplasm of endometrium: Secondary | ICD-10-CM | POA: Diagnosis not present

## 2023-05-09 DIAGNOSIS — E782 Mixed hyperlipidemia: Secondary | ICD-10-CM | POA: Diagnosis not present

## 2023-05-09 DIAGNOSIS — G311 Senile degeneration of brain, not elsewhere classified: Secondary | ICD-10-CM | POA: Diagnosis not present

## 2023-05-09 DIAGNOSIS — G301 Alzheimer's disease with late onset: Secondary | ICD-10-CM | POA: Diagnosis not present

## 2023-05-16 DIAGNOSIS — C541 Malignant neoplasm of endometrium: Secondary | ICD-10-CM | POA: Diagnosis not present

## 2023-05-16 DIAGNOSIS — N1832 Chronic kidney disease, stage 3b: Secondary | ICD-10-CM | POA: Diagnosis not present

## 2023-05-16 DIAGNOSIS — G311 Senile degeneration of brain, not elsewhere classified: Secondary | ICD-10-CM | POA: Diagnosis not present

## 2023-05-16 DIAGNOSIS — I10 Essential (primary) hypertension: Secondary | ICD-10-CM | POA: Diagnosis not present

## 2023-05-16 DIAGNOSIS — E782 Mixed hyperlipidemia: Secondary | ICD-10-CM | POA: Diagnosis not present

## 2023-05-16 DIAGNOSIS — G301 Alzheimer's disease with late onset: Secondary | ICD-10-CM | POA: Diagnosis not present

## 2023-05-17 DIAGNOSIS — I10 Essential (primary) hypertension: Secondary | ICD-10-CM | POA: Diagnosis not present

## 2023-05-17 DIAGNOSIS — N1832 Chronic kidney disease, stage 3b: Secondary | ICD-10-CM | POA: Diagnosis not present

## 2023-05-17 DIAGNOSIS — G4700 Insomnia, unspecified: Secondary | ICD-10-CM | POA: Diagnosis not present

## 2023-05-17 DIAGNOSIS — G311 Senile degeneration of brain, not elsewhere classified: Secondary | ICD-10-CM | POA: Diagnosis not present

## 2023-05-17 DIAGNOSIS — C541 Malignant neoplasm of endometrium: Secondary | ICD-10-CM | POA: Diagnosis not present

## 2023-05-17 DIAGNOSIS — I509 Heart failure, unspecified: Secondary | ICD-10-CM | POA: Diagnosis not present

## 2023-05-17 DIAGNOSIS — E782 Mixed hyperlipidemia: Secondary | ICD-10-CM | POA: Diagnosis not present

## 2023-05-17 DIAGNOSIS — G301 Alzheimer's disease with late onset: Secondary | ICD-10-CM | POA: Diagnosis not present

## 2023-05-17 DIAGNOSIS — F32A Depression, unspecified: Secondary | ICD-10-CM | POA: Diagnosis not present

## 2023-05-23 DIAGNOSIS — E782 Mixed hyperlipidemia: Secondary | ICD-10-CM | POA: Diagnosis not present

## 2023-05-23 DIAGNOSIS — I10 Essential (primary) hypertension: Secondary | ICD-10-CM | POA: Diagnosis not present

## 2023-05-23 DIAGNOSIS — G301 Alzheimer's disease with late onset: Secondary | ICD-10-CM | POA: Diagnosis not present

## 2023-05-23 DIAGNOSIS — G311 Senile degeneration of brain, not elsewhere classified: Secondary | ICD-10-CM | POA: Diagnosis not present

## 2023-05-23 DIAGNOSIS — N1832 Chronic kidney disease, stage 3b: Secondary | ICD-10-CM | POA: Diagnosis not present

## 2023-05-23 DIAGNOSIS — C541 Malignant neoplasm of endometrium: Secondary | ICD-10-CM | POA: Diagnosis not present

## 2023-05-30 DIAGNOSIS — G311 Senile degeneration of brain, not elsewhere classified: Secondary | ICD-10-CM | POA: Diagnosis not present

## 2023-05-30 DIAGNOSIS — I10 Essential (primary) hypertension: Secondary | ICD-10-CM | POA: Diagnosis not present

## 2023-05-30 DIAGNOSIS — G301 Alzheimer's disease with late onset: Secondary | ICD-10-CM | POA: Diagnosis not present

## 2023-05-30 DIAGNOSIS — C541 Malignant neoplasm of endometrium: Secondary | ICD-10-CM | POA: Diagnosis not present

## 2023-05-30 DIAGNOSIS — E782 Mixed hyperlipidemia: Secondary | ICD-10-CM | POA: Diagnosis not present

## 2023-05-30 DIAGNOSIS — N1832 Chronic kidney disease, stage 3b: Secondary | ICD-10-CM | POA: Diagnosis not present

## 2023-06-06 DIAGNOSIS — E782 Mixed hyperlipidemia: Secondary | ICD-10-CM | POA: Diagnosis not present

## 2023-06-06 DIAGNOSIS — I10 Essential (primary) hypertension: Secondary | ICD-10-CM | POA: Diagnosis not present

## 2023-06-06 DIAGNOSIS — G301 Alzheimer's disease with late onset: Secondary | ICD-10-CM | POA: Diagnosis not present

## 2023-06-06 DIAGNOSIS — C541 Malignant neoplasm of endometrium: Secondary | ICD-10-CM | POA: Diagnosis not present

## 2023-06-06 DIAGNOSIS — N1832 Chronic kidney disease, stage 3b: Secondary | ICD-10-CM | POA: Diagnosis not present

## 2023-06-06 DIAGNOSIS — G311 Senile degeneration of brain, not elsewhere classified: Secondary | ICD-10-CM | POA: Diagnosis not present

## 2023-06-13 DIAGNOSIS — N1832 Chronic kidney disease, stage 3b: Secondary | ICD-10-CM | POA: Diagnosis not present

## 2023-06-13 DIAGNOSIS — G311 Senile degeneration of brain, not elsewhere classified: Secondary | ICD-10-CM | POA: Diagnosis not present

## 2023-06-13 DIAGNOSIS — G301 Alzheimer's disease with late onset: Secondary | ICD-10-CM | POA: Diagnosis not present

## 2023-06-13 DIAGNOSIS — I10 Essential (primary) hypertension: Secondary | ICD-10-CM | POA: Diagnosis not present

## 2023-06-13 DIAGNOSIS — E782 Mixed hyperlipidemia: Secondary | ICD-10-CM | POA: Diagnosis not present

## 2023-06-13 DIAGNOSIS — C541 Malignant neoplasm of endometrium: Secondary | ICD-10-CM | POA: Diagnosis not present

## 2023-06-16 DIAGNOSIS — I509 Heart failure, unspecified: Secondary | ICD-10-CM | POA: Diagnosis not present

## 2023-06-16 DIAGNOSIS — G4700 Insomnia, unspecified: Secondary | ICD-10-CM | POA: Diagnosis not present

## 2023-06-16 DIAGNOSIS — E782 Mixed hyperlipidemia: Secondary | ICD-10-CM | POA: Diagnosis not present

## 2023-06-16 DIAGNOSIS — I10 Essential (primary) hypertension: Secondary | ICD-10-CM | POA: Diagnosis not present

## 2023-06-16 DIAGNOSIS — N1832 Chronic kidney disease, stage 3b: Secondary | ICD-10-CM | POA: Diagnosis not present

## 2023-06-16 DIAGNOSIS — F32A Depression, unspecified: Secondary | ICD-10-CM | POA: Diagnosis not present

## 2023-06-16 DIAGNOSIS — G301 Alzheimer's disease with late onset: Secondary | ICD-10-CM | POA: Diagnosis not present

## 2023-06-16 DIAGNOSIS — G311 Senile degeneration of brain, not elsewhere classified: Secondary | ICD-10-CM | POA: Diagnosis not present

## 2023-06-16 DIAGNOSIS — C541 Malignant neoplasm of endometrium: Secondary | ICD-10-CM | POA: Diagnosis not present

## 2023-06-20 DIAGNOSIS — N1832 Chronic kidney disease, stage 3b: Secondary | ICD-10-CM | POA: Diagnosis not present

## 2023-06-20 DIAGNOSIS — C541 Malignant neoplasm of endometrium: Secondary | ICD-10-CM | POA: Diagnosis not present

## 2023-06-20 DIAGNOSIS — G301 Alzheimer's disease with late onset: Secondary | ICD-10-CM | POA: Diagnosis not present

## 2023-06-20 DIAGNOSIS — E782 Mixed hyperlipidemia: Secondary | ICD-10-CM | POA: Diagnosis not present

## 2023-06-20 DIAGNOSIS — G311 Senile degeneration of brain, not elsewhere classified: Secondary | ICD-10-CM | POA: Diagnosis not present

## 2023-06-20 DIAGNOSIS — I10 Essential (primary) hypertension: Secondary | ICD-10-CM | POA: Diagnosis not present

## 2023-06-27 DIAGNOSIS — G301 Alzheimer's disease with late onset: Secondary | ICD-10-CM | POA: Diagnosis not present

## 2023-06-27 DIAGNOSIS — C541 Malignant neoplasm of endometrium: Secondary | ICD-10-CM | POA: Diagnosis not present

## 2023-06-27 DIAGNOSIS — E782 Mixed hyperlipidemia: Secondary | ICD-10-CM | POA: Diagnosis not present

## 2023-06-27 DIAGNOSIS — G311 Senile degeneration of brain, not elsewhere classified: Secondary | ICD-10-CM | POA: Diagnosis not present

## 2023-06-27 DIAGNOSIS — N1832 Chronic kidney disease, stage 3b: Secondary | ICD-10-CM | POA: Diagnosis not present

## 2023-06-27 DIAGNOSIS — I10 Essential (primary) hypertension: Secondary | ICD-10-CM | POA: Diagnosis not present

## 2023-07-02 DIAGNOSIS — I10 Essential (primary) hypertension: Secondary | ICD-10-CM | POA: Diagnosis not present

## 2023-07-02 DIAGNOSIS — N1832 Chronic kidney disease, stage 3b: Secondary | ICD-10-CM | POA: Diagnosis not present

## 2023-07-02 DIAGNOSIS — G301 Alzheimer's disease with late onset: Secondary | ICD-10-CM | POA: Diagnosis not present

## 2023-07-02 DIAGNOSIS — C541 Malignant neoplasm of endometrium: Secondary | ICD-10-CM | POA: Diagnosis not present

## 2023-07-02 DIAGNOSIS — G311 Senile degeneration of brain, not elsewhere classified: Secondary | ICD-10-CM | POA: Diagnosis not present

## 2023-07-02 DIAGNOSIS — E782 Mixed hyperlipidemia: Secondary | ICD-10-CM | POA: Diagnosis not present

## 2023-07-11 DIAGNOSIS — C541 Malignant neoplasm of endometrium: Secondary | ICD-10-CM | POA: Diagnosis not present

## 2023-07-11 DIAGNOSIS — G301 Alzheimer's disease with late onset: Secondary | ICD-10-CM | POA: Diagnosis not present

## 2023-07-11 DIAGNOSIS — G311 Senile degeneration of brain, not elsewhere classified: Secondary | ICD-10-CM | POA: Diagnosis not present

## 2023-07-11 DIAGNOSIS — E782 Mixed hyperlipidemia: Secondary | ICD-10-CM | POA: Diagnosis not present

## 2023-07-11 DIAGNOSIS — N1832 Chronic kidney disease, stage 3b: Secondary | ICD-10-CM | POA: Diagnosis not present

## 2023-07-11 DIAGNOSIS — I10 Essential (primary) hypertension: Secondary | ICD-10-CM | POA: Diagnosis not present

## 2023-07-17 DIAGNOSIS — C541 Malignant neoplasm of endometrium: Secondary | ICD-10-CM | POA: Diagnosis not present

## 2023-07-17 DIAGNOSIS — G301 Alzheimer's disease with late onset: Secondary | ICD-10-CM | POA: Diagnosis not present

## 2023-07-17 DIAGNOSIS — G311 Senile degeneration of brain, not elsewhere classified: Secondary | ICD-10-CM | POA: Diagnosis not present

## 2023-07-17 DIAGNOSIS — N1832 Chronic kidney disease, stage 3b: Secondary | ICD-10-CM | POA: Diagnosis not present

## 2023-07-17 DIAGNOSIS — G4700 Insomnia, unspecified: Secondary | ICD-10-CM | POA: Diagnosis not present

## 2023-07-17 DIAGNOSIS — I10 Essential (primary) hypertension: Secondary | ICD-10-CM | POA: Diagnosis not present

## 2023-07-17 DIAGNOSIS — E782 Mixed hyperlipidemia: Secondary | ICD-10-CM | POA: Diagnosis not present

## 2023-07-17 DIAGNOSIS — F32A Depression, unspecified: Secondary | ICD-10-CM | POA: Diagnosis not present

## 2023-07-17 DIAGNOSIS — I509 Heart failure, unspecified: Secondary | ICD-10-CM | POA: Diagnosis not present

## 2023-07-18 DIAGNOSIS — I10 Essential (primary) hypertension: Secondary | ICD-10-CM | POA: Diagnosis not present

## 2023-07-18 DIAGNOSIS — C541 Malignant neoplasm of endometrium: Secondary | ICD-10-CM | POA: Diagnosis not present

## 2023-07-18 DIAGNOSIS — G301 Alzheimer's disease with late onset: Secondary | ICD-10-CM | POA: Diagnosis not present

## 2023-07-18 DIAGNOSIS — N1832 Chronic kidney disease, stage 3b: Secondary | ICD-10-CM | POA: Diagnosis not present

## 2023-07-18 DIAGNOSIS — E782 Mixed hyperlipidemia: Secondary | ICD-10-CM | POA: Diagnosis not present

## 2023-07-18 DIAGNOSIS — G311 Senile degeneration of brain, not elsewhere classified: Secondary | ICD-10-CM | POA: Diagnosis not present

## 2023-07-21 DIAGNOSIS — G311 Senile degeneration of brain, not elsewhere classified: Secondary | ICD-10-CM | POA: Diagnosis not present

## 2023-07-21 DIAGNOSIS — C541 Malignant neoplasm of endometrium: Secondary | ICD-10-CM | POA: Diagnosis not present

## 2023-07-21 DIAGNOSIS — E782 Mixed hyperlipidemia: Secondary | ICD-10-CM | POA: Diagnosis not present

## 2023-07-21 DIAGNOSIS — G301 Alzheimer's disease with late onset: Secondary | ICD-10-CM | POA: Diagnosis not present

## 2023-07-21 DIAGNOSIS — N1832 Chronic kidney disease, stage 3b: Secondary | ICD-10-CM | POA: Diagnosis not present

## 2023-07-21 DIAGNOSIS — I10 Essential (primary) hypertension: Secondary | ICD-10-CM | POA: Diagnosis not present

## 2023-07-25 DIAGNOSIS — E782 Mixed hyperlipidemia: Secondary | ICD-10-CM | POA: Diagnosis not present

## 2023-07-25 DIAGNOSIS — G301 Alzheimer's disease with late onset: Secondary | ICD-10-CM | POA: Diagnosis not present

## 2023-07-25 DIAGNOSIS — C541 Malignant neoplasm of endometrium: Secondary | ICD-10-CM | POA: Diagnosis not present

## 2023-07-25 DIAGNOSIS — N1832 Chronic kidney disease, stage 3b: Secondary | ICD-10-CM | POA: Diagnosis not present

## 2023-07-25 DIAGNOSIS — I10 Essential (primary) hypertension: Secondary | ICD-10-CM | POA: Diagnosis not present

## 2023-07-25 DIAGNOSIS — G311 Senile degeneration of brain, not elsewhere classified: Secondary | ICD-10-CM | POA: Diagnosis not present

## 2023-07-28 DIAGNOSIS — C541 Malignant neoplasm of endometrium: Secondary | ICD-10-CM | POA: Diagnosis not present

## 2023-07-28 DIAGNOSIS — G311 Senile degeneration of brain, not elsewhere classified: Secondary | ICD-10-CM | POA: Diagnosis not present

## 2023-07-28 DIAGNOSIS — E782 Mixed hyperlipidemia: Secondary | ICD-10-CM | POA: Diagnosis not present

## 2023-07-28 DIAGNOSIS — N1832 Chronic kidney disease, stage 3b: Secondary | ICD-10-CM | POA: Diagnosis not present

## 2023-07-28 DIAGNOSIS — I10 Essential (primary) hypertension: Secondary | ICD-10-CM | POA: Diagnosis not present

## 2023-07-28 DIAGNOSIS — G301 Alzheimer's disease with late onset: Secondary | ICD-10-CM | POA: Diagnosis not present

## 2023-08-01 DIAGNOSIS — C541 Malignant neoplasm of endometrium: Secondary | ICD-10-CM | POA: Diagnosis not present

## 2023-08-01 DIAGNOSIS — G301 Alzheimer's disease with late onset: Secondary | ICD-10-CM | POA: Diagnosis not present

## 2023-08-01 DIAGNOSIS — I10 Essential (primary) hypertension: Secondary | ICD-10-CM | POA: Diagnosis not present

## 2023-08-01 DIAGNOSIS — E782 Mixed hyperlipidemia: Secondary | ICD-10-CM | POA: Diagnosis not present

## 2023-08-01 DIAGNOSIS — G311 Senile degeneration of brain, not elsewhere classified: Secondary | ICD-10-CM | POA: Diagnosis not present

## 2023-08-01 DIAGNOSIS — N1832 Chronic kidney disease, stage 3b: Secondary | ICD-10-CM | POA: Diagnosis not present

## 2023-08-08 DIAGNOSIS — C541 Malignant neoplasm of endometrium: Secondary | ICD-10-CM | POA: Diagnosis not present

## 2023-08-08 DIAGNOSIS — E782 Mixed hyperlipidemia: Secondary | ICD-10-CM | POA: Diagnosis not present

## 2023-08-08 DIAGNOSIS — G301 Alzheimer's disease with late onset: Secondary | ICD-10-CM | POA: Diagnosis not present

## 2023-08-08 DIAGNOSIS — N1832 Chronic kidney disease, stage 3b: Secondary | ICD-10-CM | POA: Diagnosis not present

## 2023-08-08 DIAGNOSIS — G311 Senile degeneration of brain, not elsewhere classified: Secondary | ICD-10-CM | POA: Diagnosis not present

## 2023-08-08 DIAGNOSIS — I10 Essential (primary) hypertension: Secondary | ICD-10-CM | POA: Diagnosis not present

## 2023-08-15 DIAGNOSIS — C541 Malignant neoplasm of endometrium: Secondary | ICD-10-CM | POA: Diagnosis not present

## 2023-08-15 DIAGNOSIS — G301 Alzheimer's disease with late onset: Secondary | ICD-10-CM | POA: Diagnosis not present

## 2023-08-15 DIAGNOSIS — E782 Mixed hyperlipidemia: Secondary | ICD-10-CM | POA: Diagnosis not present

## 2023-08-15 DIAGNOSIS — I10 Essential (primary) hypertension: Secondary | ICD-10-CM | POA: Diagnosis not present

## 2023-08-15 DIAGNOSIS — N1832 Chronic kidney disease, stage 3b: Secondary | ICD-10-CM | POA: Diagnosis not present

## 2023-08-15 DIAGNOSIS — G311 Senile degeneration of brain, not elsewhere classified: Secondary | ICD-10-CM | POA: Diagnosis not present

## 2023-08-17 DIAGNOSIS — G4700 Insomnia, unspecified: Secondary | ICD-10-CM | POA: Diagnosis not present

## 2023-08-17 DIAGNOSIS — G301 Alzheimer's disease with late onset: Secondary | ICD-10-CM | POA: Diagnosis not present

## 2023-08-17 DIAGNOSIS — I10 Essential (primary) hypertension: Secondary | ICD-10-CM | POA: Diagnosis not present

## 2023-08-17 DIAGNOSIS — E782 Mixed hyperlipidemia: Secondary | ICD-10-CM | POA: Diagnosis not present

## 2023-08-17 DIAGNOSIS — C541 Malignant neoplasm of endometrium: Secondary | ICD-10-CM | POA: Diagnosis not present

## 2023-08-17 DIAGNOSIS — I509 Heart failure, unspecified: Secondary | ICD-10-CM | POA: Diagnosis not present

## 2023-08-17 DIAGNOSIS — N1832 Chronic kidney disease, stage 3b: Secondary | ICD-10-CM | POA: Diagnosis not present

## 2023-08-17 DIAGNOSIS — G311 Senile degeneration of brain, not elsewhere classified: Secondary | ICD-10-CM | POA: Diagnosis not present

## 2023-08-17 DIAGNOSIS — F32A Depression, unspecified: Secondary | ICD-10-CM | POA: Diagnosis not present

## 2023-08-22 DIAGNOSIS — G301 Alzheimer's disease with late onset: Secondary | ICD-10-CM | POA: Diagnosis not present

## 2023-08-22 DIAGNOSIS — I10 Essential (primary) hypertension: Secondary | ICD-10-CM | POA: Diagnosis not present

## 2023-08-22 DIAGNOSIS — E782 Mixed hyperlipidemia: Secondary | ICD-10-CM | POA: Diagnosis not present

## 2023-08-22 DIAGNOSIS — C541 Malignant neoplasm of endometrium: Secondary | ICD-10-CM | POA: Diagnosis not present

## 2023-08-22 DIAGNOSIS — G311 Senile degeneration of brain, not elsewhere classified: Secondary | ICD-10-CM | POA: Diagnosis not present

## 2023-08-22 DIAGNOSIS — N1832 Chronic kidney disease, stage 3b: Secondary | ICD-10-CM | POA: Diagnosis not present

## 2023-08-29 DIAGNOSIS — C541 Malignant neoplasm of endometrium: Secondary | ICD-10-CM | POA: Diagnosis not present

## 2023-08-29 DIAGNOSIS — I10 Essential (primary) hypertension: Secondary | ICD-10-CM | POA: Diagnosis not present

## 2023-08-29 DIAGNOSIS — N1832 Chronic kidney disease, stage 3b: Secondary | ICD-10-CM | POA: Diagnosis not present

## 2023-08-29 DIAGNOSIS — E782 Mixed hyperlipidemia: Secondary | ICD-10-CM | POA: Diagnosis not present

## 2023-08-29 DIAGNOSIS — G301 Alzheimer's disease with late onset: Secondary | ICD-10-CM | POA: Diagnosis not present

## 2023-08-29 DIAGNOSIS — G311 Senile degeneration of brain, not elsewhere classified: Secondary | ICD-10-CM | POA: Diagnosis not present

## 2023-09-04 DIAGNOSIS — Z23 Encounter for immunization: Secondary | ICD-10-CM | POA: Diagnosis not present

## 2023-09-05 DIAGNOSIS — E782 Mixed hyperlipidemia: Secondary | ICD-10-CM | POA: Diagnosis not present

## 2023-09-05 DIAGNOSIS — G311 Senile degeneration of brain, not elsewhere classified: Secondary | ICD-10-CM | POA: Diagnosis not present

## 2023-09-05 DIAGNOSIS — C541 Malignant neoplasm of endometrium: Secondary | ICD-10-CM | POA: Diagnosis not present

## 2023-09-05 DIAGNOSIS — N1832 Chronic kidney disease, stage 3b: Secondary | ICD-10-CM | POA: Diagnosis not present

## 2023-09-05 DIAGNOSIS — G301 Alzheimer's disease with late onset: Secondary | ICD-10-CM | POA: Diagnosis not present

## 2023-09-05 DIAGNOSIS — I10 Essential (primary) hypertension: Secondary | ICD-10-CM | POA: Diagnosis not present

## 2023-09-09 DIAGNOSIS — N1832 Chronic kidney disease, stage 3b: Secondary | ICD-10-CM | POA: Diagnosis not present

## 2023-09-09 DIAGNOSIS — C541 Malignant neoplasm of endometrium: Secondary | ICD-10-CM | POA: Diagnosis not present

## 2023-09-09 DIAGNOSIS — G301 Alzheimer's disease with late onset: Secondary | ICD-10-CM | POA: Diagnosis not present

## 2023-09-09 DIAGNOSIS — G311 Senile degeneration of brain, not elsewhere classified: Secondary | ICD-10-CM | POA: Diagnosis not present

## 2023-09-09 DIAGNOSIS — E782 Mixed hyperlipidemia: Secondary | ICD-10-CM | POA: Diagnosis not present

## 2023-09-09 DIAGNOSIS — I10 Essential (primary) hypertension: Secondary | ICD-10-CM | POA: Diagnosis not present

## 2023-09-12 DIAGNOSIS — E782 Mixed hyperlipidemia: Secondary | ICD-10-CM | POA: Diagnosis not present

## 2023-09-12 DIAGNOSIS — N1832 Chronic kidney disease, stage 3b: Secondary | ICD-10-CM | POA: Diagnosis not present

## 2023-09-12 DIAGNOSIS — G301 Alzheimer's disease with late onset: Secondary | ICD-10-CM | POA: Diagnosis not present

## 2023-09-12 DIAGNOSIS — C541 Malignant neoplasm of endometrium: Secondary | ICD-10-CM | POA: Diagnosis not present

## 2023-09-12 DIAGNOSIS — G311 Senile degeneration of brain, not elsewhere classified: Secondary | ICD-10-CM | POA: Diagnosis not present

## 2023-09-12 DIAGNOSIS — I10 Essential (primary) hypertension: Secondary | ICD-10-CM | POA: Diagnosis not present

## 2023-09-16 DIAGNOSIS — G301 Alzheimer's disease with late onset: Secondary | ICD-10-CM | POA: Diagnosis not present

## 2023-09-16 DIAGNOSIS — C541 Malignant neoplasm of endometrium: Secondary | ICD-10-CM | POA: Diagnosis not present

## 2023-09-16 DIAGNOSIS — G4700 Insomnia, unspecified: Secondary | ICD-10-CM | POA: Diagnosis not present

## 2023-09-16 DIAGNOSIS — N1832 Chronic kidney disease, stage 3b: Secondary | ICD-10-CM | POA: Diagnosis not present

## 2023-09-16 DIAGNOSIS — I10 Essential (primary) hypertension: Secondary | ICD-10-CM | POA: Diagnosis not present

## 2023-09-16 DIAGNOSIS — G311 Senile degeneration of brain, not elsewhere classified: Secondary | ICD-10-CM | POA: Diagnosis not present

## 2023-09-16 DIAGNOSIS — E782 Mixed hyperlipidemia: Secondary | ICD-10-CM | POA: Diagnosis not present

## 2023-09-16 DIAGNOSIS — I509 Heart failure, unspecified: Secondary | ICD-10-CM | POA: Diagnosis not present

## 2023-09-16 DIAGNOSIS — F32A Depression, unspecified: Secondary | ICD-10-CM | POA: Diagnosis not present

## 2023-09-19 DIAGNOSIS — G301 Alzheimer's disease with late onset: Secondary | ICD-10-CM | POA: Diagnosis not present

## 2023-09-19 DIAGNOSIS — E782 Mixed hyperlipidemia: Secondary | ICD-10-CM | POA: Diagnosis not present

## 2023-09-19 DIAGNOSIS — C541 Malignant neoplasm of endometrium: Secondary | ICD-10-CM | POA: Diagnosis not present

## 2023-09-19 DIAGNOSIS — G311 Senile degeneration of brain, not elsewhere classified: Secondary | ICD-10-CM | POA: Diagnosis not present

## 2023-09-19 DIAGNOSIS — I10 Essential (primary) hypertension: Secondary | ICD-10-CM | POA: Diagnosis not present

## 2023-09-19 DIAGNOSIS — N1832 Chronic kidney disease, stage 3b: Secondary | ICD-10-CM | POA: Diagnosis not present

## 2023-09-26 DIAGNOSIS — C541 Malignant neoplasm of endometrium: Secondary | ICD-10-CM | POA: Diagnosis not present

## 2023-09-26 DIAGNOSIS — G311 Senile degeneration of brain, not elsewhere classified: Secondary | ICD-10-CM | POA: Diagnosis not present

## 2023-09-26 DIAGNOSIS — I10 Essential (primary) hypertension: Secondary | ICD-10-CM | POA: Diagnosis not present

## 2023-09-26 DIAGNOSIS — N1832 Chronic kidney disease, stage 3b: Secondary | ICD-10-CM | POA: Diagnosis not present

## 2023-09-26 DIAGNOSIS — G301 Alzheimer's disease with late onset: Secondary | ICD-10-CM | POA: Diagnosis not present

## 2023-09-26 DIAGNOSIS — E782 Mixed hyperlipidemia: Secondary | ICD-10-CM | POA: Diagnosis not present

## 2023-10-03 DIAGNOSIS — I10 Essential (primary) hypertension: Secondary | ICD-10-CM | POA: Diagnosis not present

## 2023-10-03 DIAGNOSIS — N1832 Chronic kidney disease, stage 3b: Secondary | ICD-10-CM | POA: Diagnosis not present

## 2023-10-03 DIAGNOSIS — G311 Senile degeneration of brain, not elsewhere classified: Secondary | ICD-10-CM | POA: Diagnosis not present

## 2023-10-03 DIAGNOSIS — C541 Malignant neoplasm of endometrium: Secondary | ICD-10-CM | POA: Diagnosis not present

## 2023-10-03 DIAGNOSIS — E782 Mixed hyperlipidemia: Secondary | ICD-10-CM | POA: Diagnosis not present

## 2023-10-03 DIAGNOSIS — G301 Alzheimer's disease with late onset: Secondary | ICD-10-CM | POA: Diagnosis not present

## 2023-10-10 DIAGNOSIS — G311 Senile degeneration of brain, not elsewhere classified: Secondary | ICD-10-CM | POA: Diagnosis not present

## 2023-10-10 DIAGNOSIS — E782 Mixed hyperlipidemia: Secondary | ICD-10-CM | POA: Diagnosis not present

## 2023-10-10 DIAGNOSIS — C541 Malignant neoplasm of endometrium: Secondary | ICD-10-CM | POA: Diagnosis not present

## 2023-10-10 DIAGNOSIS — N1832 Chronic kidney disease, stage 3b: Secondary | ICD-10-CM | POA: Diagnosis not present

## 2023-10-10 DIAGNOSIS — G301 Alzheimer's disease with late onset: Secondary | ICD-10-CM | POA: Diagnosis not present

## 2023-10-10 DIAGNOSIS — I10 Essential (primary) hypertension: Secondary | ICD-10-CM | POA: Diagnosis not present

## 2023-10-17 DIAGNOSIS — E782 Mixed hyperlipidemia: Secondary | ICD-10-CM | POA: Diagnosis not present

## 2023-10-17 DIAGNOSIS — G311 Senile degeneration of brain, not elsewhere classified: Secondary | ICD-10-CM | POA: Diagnosis not present

## 2023-10-17 DIAGNOSIS — I10 Essential (primary) hypertension: Secondary | ICD-10-CM | POA: Diagnosis not present

## 2023-10-17 DIAGNOSIS — F32A Depression, unspecified: Secondary | ICD-10-CM | POA: Diagnosis not present

## 2023-10-17 DIAGNOSIS — N1832 Chronic kidney disease, stage 3b: Secondary | ICD-10-CM | POA: Diagnosis not present

## 2023-10-17 DIAGNOSIS — C541 Malignant neoplasm of endometrium: Secondary | ICD-10-CM | POA: Diagnosis not present

## 2023-10-17 DIAGNOSIS — G4700 Insomnia, unspecified: Secondary | ICD-10-CM | POA: Diagnosis not present

## 2023-10-17 DIAGNOSIS — G301 Alzheimer's disease with late onset: Secondary | ICD-10-CM | POA: Diagnosis not present

## 2023-10-17 DIAGNOSIS — I509 Heart failure, unspecified: Secondary | ICD-10-CM | POA: Diagnosis not present

## 2023-10-24 DIAGNOSIS — G311 Senile degeneration of brain, not elsewhere classified: Secondary | ICD-10-CM | POA: Diagnosis not present

## 2023-10-24 DIAGNOSIS — N1832 Chronic kidney disease, stage 3b: Secondary | ICD-10-CM | POA: Diagnosis not present

## 2023-10-24 DIAGNOSIS — G301 Alzheimer's disease with late onset: Secondary | ICD-10-CM | POA: Diagnosis not present

## 2023-10-24 DIAGNOSIS — I10 Essential (primary) hypertension: Secondary | ICD-10-CM | POA: Diagnosis not present

## 2023-10-24 DIAGNOSIS — C541 Malignant neoplasm of endometrium: Secondary | ICD-10-CM | POA: Diagnosis not present

## 2023-10-24 DIAGNOSIS — E782 Mixed hyperlipidemia: Secondary | ICD-10-CM | POA: Diagnosis not present

## 2023-10-28 DIAGNOSIS — E782 Mixed hyperlipidemia: Secondary | ICD-10-CM | POA: Diagnosis not present

## 2023-10-28 DIAGNOSIS — C541 Malignant neoplasm of endometrium: Secondary | ICD-10-CM | POA: Diagnosis not present

## 2023-10-28 DIAGNOSIS — G311 Senile degeneration of brain, not elsewhere classified: Secondary | ICD-10-CM | POA: Diagnosis not present

## 2023-10-28 DIAGNOSIS — N1832 Chronic kidney disease, stage 3b: Secondary | ICD-10-CM | POA: Diagnosis not present

## 2023-10-28 DIAGNOSIS — G301 Alzheimer's disease with late onset: Secondary | ICD-10-CM | POA: Diagnosis not present

## 2023-10-28 DIAGNOSIS — I10 Essential (primary) hypertension: Secondary | ICD-10-CM | POA: Diagnosis not present

## 2023-10-31 DIAGNOSIS — G311 Senile degeneration of brain, not elsewhere classified: Secondary | ICD-10-CM | POA: Diagnosis not present

## 2023-10-31 DIAGNOSIS — C541 Malignant neoplasm of endometrium: Secondary | ICD-10-CM | POA: Diagnosis not present

## 2023-10-31 DIAGNOSIS — I10 Essential (primary) hypertension: Secondary | ICD-10-CM | POA: Diagnosis not present

## 2023-10-31 DIAGNOSIS — G301 Alzheimer's disease with late onset: Secondary | ICD-10-CM | POA: Diagnosis not present

## 2023-10-31 DIAGNOSIS — N1832 Chronic kidney disease, stage 3b: Secondary | ICD-10-CM | POA: Diagnosis not present

## 2023-10-31 DIAGNOSIS — E782 Mixed hyperlipidemia: Secondary | ICD-10-CM | POA: Diagnosis not present

## 2023-11-07 DIAGNOSIS — N1832 Chronic kidney disease, stage 3b: Secondary | ICD-10-CM | POA: Diagnosis not present

## 2023-11-07 DIAGNOSIS — G301 Alzheimer's disease with late onset: Secondary | ICD-10-CM | POA: Diagnosis not present

## 2023-11-07 DIAGNOSIS — C541 Malignant neoplasm of endometrium: Secondary | ICD-10-CM | POA: Diagnosis not present

## 2023-11-07 DIAGNOSIS — I10 Essential (primary) hypertension: Secondary | ICD-10-CM | POA: Diagnosis not present

## 2023-11-07 DIAGNOSIS — E782 Mixed hyperlipidemia: Secondary | ICD-10-CM | POA: Diagnosis not present

## 2023-11-07 DIAGNOSIS — G311 Senile degeneration of brain, not elsewhere classified: Secondary | ICD-10-CM | POA: Diagnosis not present

## 2023-11-10 DIAGNOSIS — G311 Senile degeneration of brain, not elsewhere classified: Secondary | ICD-10-CM | POA: Diagnosis not present

## 2023-11-10 DIAGNOSIS — G301 Alzheimer's disease with late onset: Secondary | ICD-10-CM | POA: Diagnosis not present

## 2023-11-10 DIAGNOSIS — N1832 Chronic kidney disease, stage 3b: Secondary | ICD-10-CM | POA: Diagnosis not present

## 2023-11-10 DIAGNOSIS — I10 Essential (primary) hypertension: Secondary | ICD-10-CM | POA: Diagnosis not present

## 2023-11-10 DIAGNOSIS — E782 Mixed hyperlipidemia: Secondary | ICD-10-CM | POA: Diagnosis not present

## 2023-11-10 DIAGNOSIS — C541 Malignant neoplasm of endometrium: Secondary | ICD-10-CM | POA: Diagnosis not present

## 2023-11-12 DIAGNOSIS — N1832 Chronic kidney disease, stage 3b: Secondary | ICD-10-CM | POA: Diagnosis not present

## 2023-11-12 DIAGNOSIS — G301 Alzheimer's disease with late onset: Secondary | ICD-10-CM | POA: Diagnosis not present

## 2023-11-12 DIAGNOSIS — E782 Mixed hyperlipidemia: Secondary | ICD-10-CM | POA: Diagnosis not present

## 2023-11-12 DIAGNOSIS — C541 Malignant neoplasm of endometrium: Secondary | ICD-10-CM | POA: Diagnosis not present

## 2023-11-12 DIAGNOSIS — G311 Senile degeneration of brain, not elsewhere classified: Secondary | ICD-10-CM | POA: Diagnosis not present

## 2023-11-12 DIAGNOSIS — I10 Essential (primary) hypertension: Secondary | ICD-10-CM | POA: Diagnosis not present

## 2023-11-14 DIAGNOSIS — I10 Essential (primary) hypertension: Secondary | ICD-10-CM | POA: Diagnosis not present

## 2023-11-14 DIAGNOSIS — G311 Senile degeneration of brain, not elsewhere classified: Secondary | ICD-10-CM | POA: Diagnosis not present

## 2023-11-14 DIAGNOSIS — E782 Mixed hyperlipidemia: Secondary | ICD-10-CM | POA: Diagnosis not present

## 2023-11-14 DIAGNOSIS — N1832 Chronic kidney disease, stage 3b: Secondary | ICD-10-CM | POA: Diagnosis not present

## 2023-11-16 DIAGNOSIS — G311 Senile degeneration of brain, not elsewhere classified: Secondary | ICD-10-CM | POA: Diagnosis not present

## 2023-11-16 DIAGNOSIS — E782 Mixed hyperlipidemia: Secondary | ICD-10-CM | POA: Diagnosis not present

## 2023-11-16 DIAGNOSIS — I509 Heart failure, unspecified: Secondary | ICD-10-CM | POA: Diagnosis not present

## 2023-11-16 DIAGNOSIS — G4700 Insomnia, unspecified: Secondary | ICD-10-CM | POA: Diagnosis not present

## 2023-11-16 DIAGNOSIS — G301 Alzheimer's disease with late onset: Secondary | ICD-10-CM | POA: Diagnosis not present

## 2023-11-16 DIAGNOSIS — I10 Essential (primary) hypertension: Secondary | ICD-10-CM | POA: Diagnosis not present

## 2023-11-16 DIAGNOSIS — C541 Malignant neoplasm of endometrium: Secondary | ICD-10-CM | POA: Diagnosis not present

## 2023-11-16 DIAGNOSIS — F32A Depression, unspecified: Secondary | ICD-10-CM | POA: Diagnosis not present

## 2023-11-16 DIAGNOSIS — N1832 Chronic kidney disease, stage 3b: Secondary | ICD-10-CM | POA: Diagnosis not present

## 2023-11-21 DIAGNOSIS — E782 Mixed hyperlipidemia: Secondary | ICD-10-CM | POA: Diagnosis not present

## 2023-11-21 DIAGNOSIS — N1832 Chronic kidney disease, stage 3b: Secondary | ICD-10-CM | POA: Diagnosis not present

## 2023-11-21 DIAGNOSIS — I10 Essential (primary) hypertension: Secondary | ICD-10-CM | POA: Diagnosis not present

## 2023-11-21 DIAGNOSIS — G311 Senile degeneration of brain, not elsewhere classified: Secondary | ICD-10-CM | POA: Diagnosis not present

## 2023-11-21 DIAGNOSIS — C541 Malignant neoplasm of endometrium: Secondary | ICD-10-CM | POA: Diagnosis not present

## 2023-11-21 DIAGNOSIS — G301 Alzheimer's disease with late onset: Secondary | ICD-10-CM | POA: Diagnosis not present

## 2023-11-26 DIAGNOSIS — N1832 Chronic kidney disease, stage 3b: Secondary | ICD-10-CM | POA: Diagnosis not present

## 2023-11-26 DIAGNOSIS — I10 Essential (primary) hypertension: Secondary | ICD-10-CM | POA: Diagnosis not present

## 2023-11-26 DIAGNOSIS — C541 Malignant neoplasm of endometrium: Secondary | ICD-10-CM | POA: Diagnosis not present

## 2023-11-26 DIAGNOSIS — E782 Mixed hyperlipidemia: Secondary | ICD-10-CM | POA: Diagnosis not present

## 2023-11-26 DIAGNOSIS — G301 Alzheimer's disease with late onset: Secondary | ICD-10-CM | POA: Diagnosis not present

## 2023-11-26 DIAGNOSIS — G311 Senile degeneration of brain, not elsewhere classified: Secondary | ICD-10-CM | POA: Diagnosis not present

## 2023-11-27 DIAGNOSIS — G311 Senile degeneration of brain, not elsewhere classified: Secondary | ICD-10-CM | POA: Diagnosis not present

## 2023-11-27 DIAGNOSIS — N3001 Acute cystitis with hematuria: Secondary | ICD-10-CM | POA: Diagnosis not present

## 2023-11-27 DIAGNOSIS — E782 Mixed hyperlipidemia: Secondary | ICD-10-CM | POA: Diagnosis not present

## 2023-11-27 DIAGNOSIS — N1832 Chronic kidney disease, stage 3b: Secondary | ICD-10-CM | POA: Diagnosis not present

## 2023-11-27 DIAGNOSIS — I10 Essential (primary) hypertension: Secondary | ICD-10-CM | POA: Diagnosis not present

## 2023-11-27 DIAGNOSIS — G301 Alzheimer's disease with late onset: Secondary | ICD-10-CM | POA: Diagnosis not present

## 2023-11-27 DIAGNOSIS — C541 Malignant neoplasm of endometrium: Secondary | ICD-10-CM | POA: Diagnosis not present

## 2023-11-28 DIAGNOSIS — N1832 Chronic kidney disease, stage 3b: Secondary | ICD-10-CM | POA: Diagnosis not present

## 2023-11-28 DIAGNOSIS — E782 Mixed hyperlipidemia: Secondary | ICD-10-CM | POA: Diagnosis not present

## 2023-11-28 DIAGNOSIS — C541 Malignant neoplasm of endometrium: Secondary | ICD-10-CM | POA: Diagnosis not present

## 2023-11-28 DIAGNOSIS — G311 Senile degeneration of brain, not elsewhere classified: Secondary | ICD-10-CM | POA: Diagnosis not present

## 2023-11-28 DIAGNOSIS — I10 Essential (primary) hypertension: Secondary | ICD-10-CM | POA: Diagnosis not present

## 2023-11-28 DIAGNOSIS — G301 Alzheimer's disease with late onset: Secondary | ICD-10-CM | POA: Diagnosis not present

## 2023-12-01 DIAGNOSIS — G311 Senile degeneration of brain, not elsewhere classified: Secondary | ICD-10-CM | POA: Diagnosis not present

## 2023-12-01 DIAGNOSIS — N1832 Chronic kidney disease, stage 3b: Secondary | ICD-10-CM | POA: Diagnosis not present

## 2023-12-01 DIAGNOSIS — G301 Alzheimer's disease with late onset: Secondary | ICD-10-CM | POA: Diagnosis not present

## 2023-12-01 DIAGNOSIS — C541 Malignant neoplasm of endometrium: Secondary | ICD-10-CM | POA: Diagnosis not present

## 2023-12-01 DIAGNOSIS — I10 Essential (primary) hypertension: Secondary | ICD-10-CM | POA: Diagnosis not present

## 2023-12-01 DIAGNOSIS — E782 Mixed hyperlipidemia: Secondary | ICD-10-CM | POA: Diagnosis not present

## 2023-12-04 ENCOUNTER — Emergency Department (HOSPITAL_COMMUNITY)

## 2023-12-04 ENCOUNTER — Other Ambulatory Visit: Payer: Self-pay

## 2023-12-04 ENCOUNTER — Emergency Department (HOSPITAL_COMMUNITY)
Admission: EM | Admit: 2023-12-04 | Discharge: 2023-12-04 | Disposition: A | Discharge status: Home / Self Care | Attending: Emergency Medicine | Admitting: Emergency Medicine

## 2023-12-04 DIAGNOSIS — N1832 Chronic kidney disease, stage 3b: Secondary | ICD-10-CM | POA: Diagnosis not present

## 2023-12-04 DIAGNOSIS — F0390 Unspecified dementia without behavioral disturbance: Secondary | ICD-10-CM | POA: Insufficient documentation

## 2023-12-04 DIAGNOSIS — Z7982 Long term (current) use of aspirin: Secondary | ICD-10-CM | POA: Diagnosis not present

## 2023-12-04 DIAGNOSIS — I639 Cerebral infarction, unspecified: Secondary | ICD-10-CM | POA: Diagnosis not present

## 2023-12-04 DIAGNOSIS — M160 Bilateral primary osteoarthritis of hip: Secondary | ICD-10-CM | POA: Diagnosis not present

## 2023-12-04 DIAGNOSIS — Z79899 Other long term (current) drug therapy: Secondary | ICD-10-CM | POA: Diagnosis not present

## 2023-12-04 DIAGNOSIS — W19XXXA Unspecified fall, initial encounter: Secondary | ICD-10-CM

## 2023-12-04 DIAGNOSIS — M25552 Pain in left hip: Secondary | ICD-10-CM | POA: Diagnosis not present

## 2023-12-04 DIAGNOSIS — E782 Mixed hyperlipidemia: Secondary | ICD-10-CM | POA: Diagnosis not present

## 2023-12-04 DIAGNOSIS — C541 Malignant neoplasm of endometrium: Secondary | ICD-10-CM | POA: Diagnosis not present

## 2023-12-04 DIAGNOSIS — I10 Essential (primary) hypertension: Secondary | ICD-10-CM | POA: Diagnosis not present

## 2023-12-04 DIAGNOSIS — W01198A Fall on same level from slipping, tripping and stumbling with subsequent striking against other object, initial encounter: Secondary | ICD-10-CM | POA: Diagnosis not present

## 2023-12-04 DIAGNOSIS — S0993XA Unspecified injury of face, initial encounter: Secondary | ICD-10-CM | POA: Diagnosis present

## 2023-12-04 DIAGNOSIS — S199XXA Unspecified injury of neck, initial encounter: Secondary | ICD-10-CM | POA: Diagnosis not present

## 2023-12-04 DIAGNOSIS — R58 Hemorrhage, not elsewhere classified: Secondary | ICD-10-CM | POA: Diagnosis not present

## 2023-12-04 DIAGNOSIS — Z23 Encounter for immunization: Secondary | ICD-10-CM | POA: Insufficient documentation

## 2023-12-04 DIAGNOSIS — S0181XA Laceration without foreign body of other part of head, initial encounter: Secondary | ICD-10-CM | POA: Diagnosis not present

## 2023-12-04 DIAGNOSIS — G301 Alzheimer's disease with late onset: Secondary | ICD-10-CM | POA: Diagnosis not present

## 2023-12-04 DIAGNOSIS — S0990XA Unspecified injury of head, initial encounter: Secondary | ICD-10-CM | POA: Diagnosis not present

## 2023-12-04 DIAGNOSIS — F0280 Dementia in other diseases classified elsewhere without behavioral disturbance: Secondary | ICD-10-CM | POA: Diagnosis not present

## 2023-12-04 DIAGNOSIS — G311 Senile degeneration of brain, not elsewhere classified: Secondary | ICD-10-CM | POA: Diagnosis not present

## 2023-12-04 MED ORDER — LIDOCAINE HCL (PF) 1 % IJ SOLN
30.0000 mL | Freq: Once | INTRAMUSCULAR | Status: AC
  Administered 2023-12-04: 30 mL
  Filled 2023-12-04: qty 30

## 2023-12-04 MED ORDER — ACETAMINOPHEN 325 MG PO TABS
650.0000 mg | ORAL_TABLET | Freq: Once | ORAL | Status: AC
  Administered 2023-12-04: 650 mg via ORAL
  Filled 2023-12-04: qty 2

## 2023-12-04 MED ORDER — LIDOCAINE-EPINEPHRINE-TETRACAINE (LET) TOPICAL GEL
3.0000 mL | Freq: Once | TOPICAL | Status: AC
  Administered 2023-12-04: 3 mL via TOPICAL
  Filled 2023-12-04: qty 3

## 2023-12-04 MED ORDER — TETANUS-DIPHTH-ACELL PERTUSSIS 5-2.5-18.5 LF-MCG/0.5 IM SUSY
0.5000 mL | PREFILLED_SYRINGE | Freq: Once | INTRAMUSCULAR | Status: AC
  Administered 2023-12-04: 0.5 mL via INTRAMUSCULAR
  Filled 2023-12-04: qty 0.5

## 2023-12-04 NOTE — Discharge Instructions (Signed)
Thank you for allowing [place] to be a part of Judith Schroeder's care today.  Her imaging was all negative for acute injury.  Her laceration was repaired using absorbable sutures.  These should breakdown over time and eventually fall out or be easily pulled out.  Monitor the wound for developing signs of infection such as redness, swelling, or drainage.  If any of these should develop, seek immediate medical attention.  You CAN apply antibiotic ointment twice daily over these sutures.  This will help prevent infection and help with wound healing.   She may develop worsening bruising or swelling over the left side of her face due to trauma.  If she tolerates, you may apply ice or cool compresses to help with swelling 2-3 times per day for no longer than 20 minutes at a time.  Always use a barrier between the skin and the ice/cool compress to avoid skin irritation or injury.

## 2023-12-04 NOTE — ED Provider Notes (Signed)
[place] EMERGENCY DEPARTMENT AT [facility] Provider Note   CSN: [id number] Arrival date & time: 12/04/23  1615     History  Chief Complaint  Patient presents with   Fall   Laceration    DHANVI BUCKMAN is a 77 y.o. female.  She has a history of dementia and is in assisted living facility.  She presents to the ER via EMS for evaluation of a head laceration after a fall.  She is accompanied by the director of her nursing facility as her daughter is unfortunately on a cruise in [place], but has been notified of today's fall.  Patient uses a potty chair, was sitting out today and has a lap belt for security as she is a high fall risk.  Apparently while on this she grabbed a bedside table and fell down and struck her head either on the bedside table or on the foot of the bed and has a large laceration to the left side of her forehead.  She is not on blood thinners, no LOC, she is given complaint with some left hip pain as well with this.   Fall  Laceration      Home Medications Prior to Admission medications   Medication Sig Start Date End Date Taking? Authorizing Provider  aspirin EC 81 MG tablet Take 81 mg by mouth daily.     [provider]  cholecalciferol (VITAMIN D3) 25 MCG (1000 UT) tablet Take 1,000 Units by mouth every morning.    [provider]  docusate sodium (COLACE) 100 MG capsule Take 100 mg by mouth 2 (two) times daily.    [provider]  donepezil (ARICEPT) 10 MG tablet Take 1 tablet (10 mg total) by mouth at bedtime. 11/16/18   Anson Fret, MD  eplerenone (INSPRA) 25 MG tablet Take 25 mg by mouth every morning.     [provider]  furosemide (LASIX) 20 MG tablet Take 20 mg by mouth daily. Take 1 tablet by mouth in addition to scheduled dose if weight gain of 3 lbs in 1 day and or 5 lbs/ 1 week for 3 days.    [provider]  levocetirizine (XYZAL) 5 MG tablet Take 5 mg by mouth at bedtime. 01/12/20    [provider]  LORazepam (ATIVAN) 0.5 MG tablet 1 tab every 8 hours as needed for agitation/anxiety that cannot be redirected. Watch for sedation and risk of falls. Patient taking differently: Take 0.5 mg by mouth every 6 (six) hours as needed for anxiety. May take 1 tab every 6 hours as needed for agitation/anxiety that cannot be redirected. Watch for sedation and risk of falls. 09/29/18   Anson Fret, MD  magnesium oxide (MAG-OX) 400 MG tablet Take 400 mg by mouth daily.     [provider]  memantine (NAMENDA) 10 MG tablet TAKE (1) TABLET BY MOUTH TWICE DAILY. Patient taking differently: Take 10 mg by mouth 2 (two) times daily. 10/15/18   Anson Fret, MD  metoprolol tartrate (LOPRESSOR) 25 MG tablet Take 25 mg by mouth daily.    [provider]  risperiDONE (RISPERDAL) 0.5 MG tablet Take 5 tablets (2.5 mg total) by mouth at bedtime. 10/31/21   [name], NP  rosuvastatin (CRESTOR) 5 MG tablet Take 5 mg by mouth every morning.     [provider]  sertraline (ZOLOFT) 100 MG tablet Take 1 tablet (100 mg total) by mouth daily. 02/09/20   Anson Fret, MD  valACYclovir (VALTREX) 1000 MG tablet Take 1 g by mouth every morning. 10/08/18   [provider]      Allergies    Atorvastatin    Review of Systems   Review of Systems  Physical Exam Updated Vital Signs BP (!) 144/77   Pulse 77   Temp 98.4 F (36.9 C) (Oral)   Resp 16   Ht 5\' 3"  (1.6 m)   Wt 59 kg   SpO2 98%   BMI 23.03 kg/m  Physical Exam Vitals and nursing note reviewed.  Constitutional:      General: She is not in acute distress.    Appearance: She is well-developed.     Comments: Patient is contracted, appears well cared for, clean and well-developed  HENT:     Head: Normocephalic.     Comments: Approximately 8 cm laceration to left forehead that is gaping Eyes:     Extraocular Movements: Extraocular movements intact.     Conjunctiva/sclera:  Conjunctivae normal.     Pupils: Pupils are equal, round, and reactive to light.  Cardiovascular:     Rate and Rhythm: Normal rate and regular rhythm.     Heart sounds: No murmur heard. Pulmonary:     Effort: Pulmonary effort is normal. No respiratory distress.     Breath sounds: Normal breath sounds.  Abdominal:     Palpations: Abdomen is soft.     Tenderness: There is no abdominal tenderness.  Musculoskeletal:        General: No swelling.     Cervical back: Neck supple.  Skin:    General: Skin is warm and dry.     Capillary Refill: Capillary refill takes less than 2 seconds.  Neurological:     General: No focal deficit present.     Mental Status: She is alert and oriented to person, place, and time.  Psychiatric:        Mood and Affect: Mood normal.     ED Results / Procedures / Treatments   Labs (all labs ordered are listed, but only abnormal results are displayed) Labs Reviewed - No data to display  EKG None  Radiology No results found.  Procedures Procedures    Medications Ordered in ED Medications  Tdap (BOOSTRIX) injection 0.5 mL (has no administration in time range)    ED Course/ Medical Decision Making/ A&P                                 Medical Decision Making This patient presents to the ED for concern of fall with head injury, this involves an extensive number of treatment options, and is a complaint that carries with it a high risk of complications and morbidity.  The differential diagnosis includes hemorrhage, fracture, laceration, other   Co morbidities that complicate the patient evaluation :   Dementia      Imaging Studies ordered:  I ordered imaging studies including CT head and C-spine which shows intracranial hemorrhage, no C-spine fracture I independently visualized and interpreted imaging within scope of identifying emergent findings  I agree with the radiologist interpretation      Problem List / ED Course / Critical  interventions / Medication management  Fall with scalp laceration-no intracranial hemorrhage or skull, no C-spine injury.  Pending laceration repair, signed out to a Melton Alar  I have reviewed the patients home medicines and have made adjustments as needed   Social Determinants of Health:  Patient lives in assisted living, severe dementia   Amount and/or Complexity of Data Reviewed Radiology: ordered.  Risk OTC drugs. Prescription drug management.           Final Clinical Impression(s) / ED Diagnoses Final diagnoses:  None    Rx / DC Orders ED Discharge Orders     None         Ma Rings, PA-C 12/04/23 1920    Vanetta Mulders, MD 12/05/23 2140

## 2023-12-04 NOTE — ED Provider Notes (Signed)
Accepted handoff at shift change from [facility], [place]. Please see prior provider note for more detail.   Briefly: Patient is 77 y.o. with history of dementia presents to ED for evaluation of head laceration after a fall.  Patient was sitting on potty chair, grabbed bedside table, and fell striking her head on either the table or foot of the bed.    Plan: follow up on imaging; repair laceration to forehead    Results for orders placed or performed in visit on 06/01/19  Novel Coronavirus, NAA ([facility])   Collection Time: 06/01/19 12:00 AM  Result Value Ref Range   SARS-CoV-2, NAA Not Detected Not Detected   DG Hip Unilat W or Wo Pelvis 2-3 Views Left Result Date: 12/04/2023 CLINICAL DATA:  Fall, left hip pain EXAM: DG HIP (WITH OR WITHOUT PELVIS) 2-3V LEFT COMPARISON:  None Available. FINDINGS: Normal alignment. No acute fracture or dislocation. Mild to moderate bilateral degenerative hip arthritis. Degenerative changes are seen within the lumbar spine. Moderate stool within the rectal vault IMPRESSION: 1. Mild to moderate bilateral degenerative hip arthritis. Electronically Signed   By: Helyn Numbers M.D.   On: 12/04/2023 21:00   CT Head Wo Contrast Result Date: 12/04/2023 CLINICAL DATA:  Head trauma, minor (Age >= 65y); Neck trauma (Age >= 65y). Fall with head strike on nightstand. Forehead laceration. History of dementia. EXAM: CT HEAD WITHOUT CONTRAST CT CERVICAL SPINE WITHOUT CONTRAST TECHNIQUE: Multidetector CT imaging of the head and cervical spine was performed following the standard protocol without intravenous contrast. Multiplanar CT image reconstructions of the cervical spine were also generated. RADIATION DOSE REDUCTION: This exam was performed according to the departmental dose-optimization program which includes automated exposure control, adjustment of the mA and/or kV according to patient size and/or use of iterative reconstruction technique. COMPARISON:  CT head and cervical  spine 05/31/2022 FINDINGS: CT HEAD FINDINGS Brain: There is no evidence of an acute infarct, intracranial hemorrhage, mass, midline shift, or extra-axial fluid collection. Hypodensities in the cerebral white matter are nonspecific but compatible with mild chronic small vessel ischemic disease. 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 There is mild cerebral atrophy. Vascular: Calcified atherosclerosis at the skull base. No hyperdense vessel. Skull: No acute fracture or suspicious osseous lesion. Sinuses/Orbits: Visualized paranasal sinuses and mastoid air cells are clear. Visualized orbits are unremarkable. Other: Left forehead laceration. CT CERVICAL SPINE FINDINGS Alignment: Normal. Skull base and vertebrae: No acute fracture or suspicious osseous lesion. Chronic T1 and T2 compression fractures. Moderate atlantodental degenerative changes. Soft tissues and spinal canal: No prevertebral fluid or swelling. No visible canal hematoma. Disc levels: Moderate cervical spondylosis and upper cervical facet arthrosis. Upper chest: Mild biapical lung scarring. Other: None. IMPRESSION: No evidence of acute intracranial abnormality or cervical spine fracture. Electronically Signed   By: Sebastian Ache M.D.   On: 12/04/2023 18:49   CT Cervical Spine Wo Contrast Result Date: 12/04/2023 CLINICAL DATA:  Head trauma, minor (Age >= 65y); Neck trauma (Age >= 65y). Fall with head strike on nightstand. Forehead laceration. History of dementia. EXAM: CT HEAD WITHOUT CONTRAST CT CERVICAL SPINE WITHOUT CONTRAST TECHNIQUE: Multidetector CT imaging of the head and cervical spine was performed following the standard protocol without intravenous contrast. Multiplanar CT image reconstructions of the cervical spine were also generated. RADIATION DOSE REDUCTION: This exam was performed according to the departmental dose-optimization program which includes automated exposure control, adjustment of the mA and/or  kV according to patient size and/or use of iterative reconstruction  technique. COMPARISON:  CT head and cervical spine 05/31/2022 FINDINGS: CT HEAD FINDINGS Brain: There is no evidence of an acute infarct, intracranial hemorrhage, mass, midline shift, or extra-axial fluid collection. Hypodensities in the cerebral white matter are nonspecific but compatible with mild chronic small vessel ischemic disease. 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 There is mild cerebral atrophy. Vascular: Calcified atherosclerosis at the skull base. No hyperdense vessel. Skull: No acute fracture or suspicious osseous lesion. Sinuses/Orbits: Visualized paranasal sinuses and mastoid air cells are clear. Visualized orbits are unremarkable. Other: Left forehead laceration. CT CERVICAL SPINE FINDINGS Alignment: Normal. Skull base and vertebrae: No acute fracture or suspicious osseous lesion. Chronic T1 and T2 compression fractures. Moderate atlantodental degenerative changes. Soft tissues and spinal canal: No prevertebral fluid or swelling. No visible canal hematoma. Disc levels: Moderate cervical spondylosis and upper cervical facet arthrosis. Upper chest: Mild biapical lung scarring. Other: None. IMPRESSION: No evidence of acute intracranial abnormality or cervical spine fracture. Electronically Signed   By: Sebastian Ache M.D.   On: 12/04/2023 18:49    Physical Exam  BP (!) 100/56   Pulse 75   Temp 98.5 F (36.9 C) (Oral)   Resp (!) 24   Ht 5\' 3"  (1.6 m)   Wt 59 kg   SpO2 97%   BMI 23.03 kg/m   Physical Exam Vitals and nursing note reviewed.  Constitutional:      General: She is not in acute distress.    Appearance: Normal appearance. She is not ill-appearing or diaphoretic.  HENT:     Head: Normocephalic. Laceration (approximately 8 cm laceration to left forehead) present.  Cardiovascular:     Rate and Rhythm: Normal rate and regular rhythm.  Pulmonary:     Effort: Pulmonary effort  is normal.  Skin:    General: Skin is warm and dry.     Capillary Refill: Capillary refill takes less than 2 seconds.  Neurological:     Mental Status: She is alert. Mental status is at baseline.  Psychiatric:        Mood and Affect: Mood normal.        Behavior: Behavior normal.     Procedures  .Laceration Repair  Date/Time: 12/04/2023 11:17 PM  Performed by: Lenard Simmer, PA-C Authorized by: Lenard Simmer, PA-C   Consent:    Consent obtained:  Verbal   Consent given by:  Healthcare agent   Risks, benefits, and alternatives were discussed: yes     Risks discussed:  Infection, pain, poor cosmetic result, poor wound healing and need for additional repair   Alternatives discussed:  No treatment Universal protocol:    Procedure explained and questions answered to patient or proxy's satisfaction: yes     Imaging studies available: yes     Patient identity confirmed:  Arm band Anesthesia:    Anesthesia method:  Topical application and local infiltration   Topical anesthetic:  LET   Local anesthetic:  Lidocaine 1% WITH epi Laceration details:    Location:  Face   Face location:  Forehead   Length (cm):  8 Pre-procedure details:    Preparation:  Patient was prepped and draped in usual sterile fashion and imaging obtained to evaluate for foreign bodies Exploration:    Limited defect created (wound extended): no     Hemostasis achieved with:  Direct pressure   Imaging obtained: x-ray     Imaging outcome: foreign body not noted     Wound exploration: wound explored  through full range of motion and entire depth of wound visualized   Treatment:    Area cleansed with:  Saline   Amount of cleaning:  Standard Skin repair:    Repair method:  Sutures   Suture size:  4-0   Suture material:  Fast-absorbing gut   Suture technique:  Simple interrupted   Number of sutures:  11 Approximation:    Approximation:  Close Repair type:    Repair type:  Simple Post-procedure details:     Dressing:  Open (no dressing)   Procedure completion:  Tolerated well, no immediate complications   ED Course / MDM    Medical Decision Making Amount and/or Complexity of Data Reviewed Radiology: ordered.  Risk OTC drugs. Prescription drug management.   Imaging without acute findings.  Laceration was repaired.  See procedure section for more details.  Patient tolerated the procedure well.  Discussed wound care with caregiver at bedside.  Sutures are absorbable and patient will not need to return for removal.    The patient has been appropriately medically screened and/or stabilized in the ED. I have low suspicion for any other emergent medical condition which would require further screening, evaluation or treatment in the ED or require inpatient management. At time of discharge the patient is hemodynamically stable and in no acute distress. I have discussed work-up results and diagnosis with patient's caregiver and answered all questions. Patient's caregiver is agreeable with discharge plan. We discussed strict return precautions for returning to the emergency department and they verbalized understanding.          Lenard Simmer, PA-C 12/04/23 [id number]    Vanetta Mulders, MD 12/05/23 [id number]

## 2023-12-04 NOTE — ED Triage Notes (Signed)
Pt was on a bedside commode and fell over and hit her head onto a night stand. Pt has  4 in laceration on her forehead that REMS covered and bleeding is under control. Pt neck is stabilized. Pt has Dementia and does slur her words sometimes at baseline. Caretaker at bedside. MOST form at bedside.

## 2023-12-05 DIAGNOSIS — G311 Senile degeneration of brain, not elsewhere classified: Secondary | ICD-10-CM | POA: Diagnosis not present

## 2023-12-05 DIAGNOSIS — E782 Mixed hyperlipidemia: Secondary | ICD-10-CM | POA: Diagnosis not present

## 2023-12-05 DIAGNOSIS — N1832 Chronic kidney disease, stage 3b: Secondary | ICD-10-CM | POA: Diagnosis not present

## 2023-12-05 DIAGNOSIS — G301 Alzheimer's disease with late onset: Secondary | ICD-10-CM | POA: Diagnosis not present

## 2023-12-05 DIAGNOSIS — C541 Malignant neoplasm of endometrium: Secondary | ICD-10-CM | POA: Diagnosis not present

## 2023-12-05 DIAGNOSIS — I10 Essential (primary) hypertension: Secondary | ICD-10-CM | POA: Diagnosis not present

## 2023-12-12 DIAGNOSIS — G301 Alzheimer's disease with late onset: Secondary | ICD-10-CM | POA: Diagnosis not present

## 2023-12-12 DIAGNOSIS — G311 Senile degeneration of brain, not elsewhere classified: Secondary | ICD-10-CM | POA: Diagnosis not present

## 2023-12-12 DIAGNOSIS — C541 Malignant neoplasm of endometrium: Secondary | ICD-10-CM | POA: Diagnosis not present

## 2023-12-12 DIAGNOSIS — E782 Mixed hyperlipidemia: Secondary | ICD-10-CM | POA: Diagnosis not present

## 2023-12-12 DIAGNOSIS — I10 Essential (primary) hypertension: Secondary | ICD-10-CM | POA: Diagnosis not present

## 2023-12-12 DIAGNOSIS — N1832 Chronic kidney disease, stage 3b: Secondary | ICD-10-CM | POA: Diagnosis not present

## 2023-12-15 DIAGNOSIS — G311 Senile degeneration of brain, not elsewhere classified: Secondary | ICD-10-CM | POA: Diagnosis not present

## 2023-12-15 DIAGNOSIS — G301 Alzheimer's disease with late onset: Secondary | ICD-10-CM | POA: Diagnosis not present

## 2023-12-15 DIAGNOSIS — C541 Malignant neoplasm of endometrium: Secondary | ICD-10-CM | POA: Diagnosis not present

## 2023-12-15 DIAGNOSIS — I10 Essential (primary) hypertension: Secondary | ICD-10-CM | POA: Diagnosis not present

## 2023-12-15 DIAGNOSIS — E782 Mixed hyperlipidemia: Secondary | ICD-10-CM | POA: Diagnosis not present

## 2023-12-15 DIAGNOSIS — N1832 Chronic kidney disease, stage 3b: Secondary | ICD-10-CM | POA: Diagnosis not present

## 2023-12-17 DIAGNOSIS — F32A Depression, unspecified: Secondary | ICD-10-CM | POA: Diagnosis not present

## 2023-12-17 DIAGNOSIS — I10 Essential (primary) hypertension: Secondary | ICD-10-CM | POA: Diagnosis not present

## 2023-12-17 DIAGNOSIS — C541 Malignant neoplasm of endometrium: Secondary | ICD-10-CM | POA: Diagnosis not present

## 2023-12-17 DIAGNOSIS — I509 Heart failure, unspecified: Secondary | ICD-10-CM | POA: Diagnosis not present

## 2023-12-17 DIAGNOSIS — G301 Alzheimer's disease with late onset: Secondary | ICD-10-CM | POA: Diagnosis not present

## 2023-12-17 DIAGNOSIS — E782 Mixed hyperlipidemia: Secondary | ICD-10-CM | POA: Diagnosis not present

## 2023-12-17 DIAGNOSIS — G4700 Insomnia, unspecified: Secondary | ICD-10-CM | POA: Diagnosis not present

## 2023-12-17 DIAGNOSIS — G311 Senile degeneration of brain, not elsewhere classified: Secondary | ICD-10-CM | POA: Diagnosis not present

## 2023-12-17 DIAGNOSIS — N1832 Chronic kidney disease, stage 3b: Secondary | ICD-10-CM | POA: Diagnosis not present

## 2023-12-19 DIAGNOSIS — E782 Mixed hyperlipidemia: Secondary | ICD-10-CM | POA: Diagnosis not present

## 2023-12-19 DIAGNOSIS — G301 Alzheimer's disease with late onset: Secondary | ICD-10-CM | POA: Diagnosis not present

## 2023-12-19 DIAGNOSIS — I10 Essential (primary) hypertension: Secondary | ICD-10-CM | POA: Diagnosis not present

## 2023-12-19 DIAGNOSIS — G311 Senile degeneration of brain, not elsewhere classified: Secondary | ICD-10-CM | POA: Diagnosis not present

## 2023-12-19 DIAGNOSIS — C541 Malignant neoplasm of endometrium: Secondary | ICD-10-CM | POA: Diagnosis not present

## 2023-12-19 DIAGNOSIS — N1832 Chronic kidney disease, stage 3b: Secondary | ICD-10-CM | POA: Diagnosis not present

## 2023-12-24 DIAGNOSIS — E782 Mixed hyperlipidemia: Secondary | ICD-10-CM | POA: Diagnosis not present

## 2023-12-24 DIAGNOSIS — C541 Malignant neoplasm of endometrium: Secondary | ICD-10-CM | POA: Diagnosis not present

## 2023-12-24 DIAGNOSIS — N1832 Chronic kidney disease, stage 3b: Secondary | ICD-10-CM | POA: Diagnosis not present

## 2023-12-24 DIAGNOSIS — I10 Essential (primary) hypertension: Secondary | ICD-10-CM | POA: Diagnosis not present

## 2023-12-24 DIAGNOSIS — G311 Senile degeneration of brain, not elsewhere classified: Secondary | ICD-10-CM | POA: Diagnosis not present

## 2023-12-24 DIAGNOSIS — G301 Alzheimer's disease with late onset: Secondary | ICD-10-CM | POA: Diagnosis not present

## 2023-12-26 DIAGNOSIS — C541 Malignant neoplasm of endometrium: Secondary | ICD-10-CM | POA: Diagnosis not present

## 2023-12-26 DIAGNOSIS — E782 Mixed hyperlipidemia: Secondary | ICD-10-CM | POA: Diagnosis not present

## 2023-12-26 DIAGNOSIS — G301 Alzheimer's disease with late onset: Secondary | ICD-10-CM | POA: Diagnosis not present

## 2023-12-26 DIAGNOSIS — G311 Senile degeneration of brain, not elsewhere classified: Secondary | ICD-10-CM | POA: Diagnosis not present

## 2023-12-26 DIAGNOSIS — N1832 Chronic kidney disease, stage 3b: Secondary | ICD-10-CM | POA: Diagnosis not present

## 2023-12-26 DIAGNOSIS — I10 Essential (primary) hypertension: Secondary | ICD-10-CM | POA: Diagnosis not present

## 2023-12-29 DIAGNOSIS — E782 Mixed hyperlipidemia: Secondary | ICD-10-CM | POA: Diagnosis not present

## 2023-12-29 DIAGNOSIS — C541 Malignant neoplasm of endometrium: Secondary | ICD-10-CM | POA: Diagnosis not present

## 2023-12-29 DIAGNOSIS — I10 Essential (primary) hypertension: Secondary | ICD-10-CM | POA: Diagnosis not present

## 2023-12-29 DIAGNOSIS — N1832 Chronic kidney disease, stage 3b: Secondary | ICD-10-CM | POA: Diagnosis not present

## 2023-12-29 DIAGNOSIS — G301 Alzheimer's disease with late onset: Secondary | ICD-10-CM | POA: Diagnosis not present

## 2023-12-29 DIAGNOSIS — G311 Senile degeneration of brain, not elsewhere classified: Secondary | ICD-10-CM | POA: Diagnosis not present

## 2024-01-02 DIAGNOSIS — E782 Mixed hyperlipidemia: Secondary | ICD-10-CM | POA: Diagnosis not present

## 2024-01-02 DIAGNOSIS — N1832 Chronic kidney disease, stage 3b: Secondary | ICD-10-CM | POA: Diagnosis not present

## 2024-01-02 DIAGNOSIS — G311 Senile degeneration of brain, not elsewhere classified: Secondary | ICD-10-CM | POA: Diagnosis not present

## 2024-01-02 DIAGNOSIS — I10 Essential (primary) hypertension: Secondary | ICD-10-CM | POA: Diagnosis not present

## 2024-01-02 DIAGNOSIS — C541 Malignant neoplasm of endometrium: Secondary | ICD-10-CM | POA: Diagnosis not present

## 2024-01-02 DIAGNOSIS — G301 Alzheimer's disease with late onset: Secondary | ICD-10-CM | POA: Diagnosis not present

## 2024-01-09 DIAGNOSIS — N1832 Chronic kidney disease, stage 3b: Secondary | ICD-10-CM | POA: Diagnosis not present

## 2024-01-09 DIAGNOSIS — G301 Alzheimer's disease with late onset: Secondary | ICD-10-CM | POA: Diagnosis not present

## 2024-01-09 DIAGNOSIS — G311 Senile degeneration of brain, not elsewhere classified: Secondary | ICD-10-CM | POA: Diagnosis not present

## 2024-01-09 DIAGNOSIS — I10 Essential (primary) hypertension: Secondary | ICD-10-CM | POA: Diagnosis not present

## 2024-01-09 DIAGNOSIS — C541 Malignant neoplasm of endometrium: Secondary | ICD-10-CM | POA: Diagnosis not present

## 2024-01-09 DIAGNOSIS — E782 Mixed hyperlipidemia: Secondary | ICD-10-CM | POA: Diagnosis not present

## 2024-01-13 DIAGNOSIS — N1832 Chronic kidney disease, stage 3b: Secondary | ICD-10-CM | POA: Diagnosis not present

## 2024-01-13 DIAGNOSIS — G301 Alzheimer's disease with late onset: Secondary | ICD-10-CM | POA: Diagnosis not present

## 2024-01-13 DIAGNOSIS — I10 Essential (primary) hypertension: Secondary | ICD-10-CM | POA: Diagnosis not present

## 2024-01-13 DIAGNOSIS — G311 Senile degeneration of brain, not elsewhere classified: Secondary | ICD-10-CM | POA: Diagnosis not present

## 2024-01-13 DIAGNOSIS — E782 Mixed hyperlipidemia: Secondary | ICD-10-CM | POA: Diagnosis not present

## 2024-01-13 DIAGNOSIS — C541 Malignant neoplasm of endometrium: Secondary | ICD-10-CM | POA: Diagnosis not present

## 2024-01-16 DIAGNOSIS — E782 Mixed hyperlipidemia: Secondary | ICD-10-CM | POA: Diagnosis not present

## 2024-01-16 DIAGNOSIS — G301 Alzheimer's disease with late onset: Secondary | ICD-10-CM | POA: Diagnosis not present

## 2024-01-16 DIAGNOSIS — I10 Essential (primary) hypertension: Secondary | ICD-10-CM | POA: Diagnosis not present

## 2024-01-16 DIAGNOSIS — N1832 Chronic kidney disease, stage 3b: Secondary | ICD-10-CM | POA: Diagnosis not present

## 2024-01-16 DIAGNOSIS — G311 Senile degeneration of brain, not elsewhere classified: Secondary | ICD-10-CM | POA: Diagnosis not present

## 2024-01-16 DIAGNOSIS — C541 Malignant neoplasm of endometrium: Secondary | ICD-10-CM | POA: Diagnosis not present

## 2024-01-17 DIAGNOSIS — E782 Mixed hyperlipidemia: Secondary | ICD-10-CM | POA: Diagnosis not present

## 2024-01-17 DIAGNOSIS — G311 Senile degeneration of brain, not elsewhere classified: Secondary | ICD-10-CM | POA: Diagnosis not present

## 2024-01-17 DIAGNOSIS — N1832 Chronic kidney disease, stage 3b: Secondary | ICD-10-CM | POA: Diagnosis not present

## 2024-01-17 DIAGNOSIS — G4700 Insomnia, unspecified: Secondary | ICD-10-CM | POA: Diagnosis not present

## 2024-01-17 DIAGNOSIS — C541 Malignant neoplasm of endometrium: Secondary | ICD-10-CM | POA: Diagnosis not present

## 2024-01-17 DIAGNOSIS — I509 Heart failure, unspecified: Secondary | ICD-10-CM | POA: Diagnosis not present

## 2024-01-17 DIAGNOSIS — G301 Alzheimer's disease with late onset: Secondary | ICD-10-CM | POA: Diagnosis not present

## 2024-01-17 DIAGNOSIS — F32A Depression, unspecified: Secondary | ICD-10-CM | POA: Diagnosis not present

## 2024-01-17 DIAGNOSIS — I10 Essential (primary) hypertension: Secondary | ICD-10-CM | POA: Diagnosis not present

## 2024-01-22 DIAGNOSIS — N1832 Chronic kidney disease, stage 3b: Secondary | ICD-10-CM | POA: Diagnosis not present

## 2024-01-22 DIAGNOSIS — E782 Mixed hyperlipidemia: Secondary | ICD-10-CM | POA: Diagnosis not present

## 2024-01-22 DIAGNOSIS — G301 Alzheimer's disease with late onset: Secondary | ICD-10-CM | POA: Diagnosis not present

## 2024-01-22 DIAGNOSIS — C541 Malignant neoplasm of endometrium: Secondary | ICD-10-CM | POA: Diagnosis not present

## 2024-01-22 DIAGNOSIS — I10 Essential (primary) hypertension: Secondary | ICD-10-CM | POA: Diagnosis not present

## 2024-01-22 DIAGNOSIS — G311 Senile degeneration of brain, not elsewhere classified: Secondary | ICD-10-CM | POA: Diagnosis not present

## 2024-01-23 DIAGNOSIS — C541 Malignant neoplasm of endometrium: Secondary | ICD-10-CM | POA: Diagnosis not present

## 2024-01-23 DIAGNOSIS — E782 Mixed hyperlipidemia: Secondary | ICD-10-CM | POA: Diagnosis not present

## 2024-01-23 DIAGNOSIS — I10 Essential (primary) hypertension: Secondary | ICD-10-CM | POA: Diagnosis not present

## 2024-01-23 DIAGNOSIS — N1832 Chronic kidney disease, stage 3b: Secondary | ICD-10-CM | POA: Diagnosis not present

## 2024-01-23 DIAGNOSIS — G301 Alzheimer's disease with late onset: Secondary | ICD-10-CM | POA: Diagnosis not present

## 2024-01-23 DIAGNOSIS — G311 Senile degeneration of brain, not elsewhere classified: Secondary | ICD-10-CM | POA: Diagnosis not present

## 2024-01-26 DIAGNOSIS — I10 Essential (primary) hypertension: Secondary | ICD-10-CM | POA: Diagnosis not present

## 2024-01-26 DIAGNOSIS — C541 Malignant neoplasm of endometrium: Secondary | ICD-10-CM | POA: Diagnosis not present

## 2024-01-26 DIAGNOSIS — G301 Alzheimer's disease with late onset: Secondary | ICD-10-CM | POA: Diagnosis not present

## 2024-01-26 DIAGNOSIS — E782 Mixed hyperlipidemia: Secondary | ICD-10-CM | POA: Diagnosis not present

## 2024-01-26 DIAGNOSIS — G311 Senile degeneration of brain, not elsewhere classified: Secondary | ICD-10-CM | POA: Diagnosis not present

## 2024-01-26 DIAGNOSIS — N1832 Chronic kidney disease, stage 3b: Secondary | ICD-10-CM | POA: Diagnosis not present

## 2024-01-28 DIAGNOSIS — E782 Mixed hyperlipidemia: Secondary | ICD-10-CM | POA: Diagnosis not present

## 2024-01-28 DIAGNOSIS — C541 Malignant neoplasm of endometrium: Secondary | ICD-10-CM | POA: Diagnosis not present

## 2024-01-28 DIAGNOSIS — I10 Essential (primary) hypertension: Secondary | ICD-10-CM | POA: Diagnosis not present

## 2024-01-28 DIAGNOSIS — N1832 Chronic kidney disease, stage 3b: Secondary | ICD-10-CM | POA: Diagnosis not present

## 2024-01-28 DIAGNOSIS — G301 Alzheimer's disease with late onset: Secondary | ICD-10-CM | POA: Diagnosis not present

## 2024-01-28 DIAGNOSIS — G311 Senile degeneration of brain, not elsewhere classified: Secondary | ICD-10-CM | POA: Diagnosis not present

## 2024-01-30 DIAGNOSIS — N1832 Chronic kidney disease, stage 3b: Secondary | ICD-10-CM | POA: Diagnosis not present

## 2024-01-30 DIAGNOSIS — G311 Senile degeneration of brain, not elsewhere classified: Secondary | ICD-10-CM | POA: Diagnosis not present

## 2024-01-30 DIAGNOSIS — I10 Essential (primary) hypertension: Secondary | ICD-10-CM | POA: Diagnosis not present

## 2024-01-30 DIAGNOSIS — E782 Mixed hyperlipidemia: Secondary | ICD-10-CM | POA: Diagnosis not present

## 2024-01-30 DIAGNOSIS — G301 Alzheimer's disease with late onset: Secondary | ICD-10-CM | POA: Diagnosis not present

## 2024-01-30 DIAGNOSIS — C541 Malignant neoplasm of endometrium: Secondary | ICD-10-CM | POA: Diagnosis not present

## 2024-02-03 DIAGNOSIS — G301 Alzheimer's disease with late onset: Secondary | ICD-10-CM | POA: Diagnosis not present

## 2024-02-03 DIAGNOSIS — E782 Mixed hyperlipidemia: Secondary | ICD-10-CM | POA: Diagnosis not present

## 2024-02-03 DIAGNOSIS — I10 Essential (primary) hypertension: Secondary | ICD-10-CM | POA: Diagnosis not present

## 2024-02-03 DIAGNOSIS — C541 Malignant neoplasm of endometrium: Secondary | ICD-10-CM | POA: Diagnosis not present

## 2024-02-03 DIAGNOSIS — N1832 Chronic kidney disease, stage 3b: Secondary | ICD-10-CM | POA: Diagnosis not present

## 2024-02-03 DIAGNOSIS — G311 Senile degeneration of brain, not elsewhere classified: Secondary | ICD-10-CM | POA: Diagnosis not present

## 2024-02-09 DIAGNOSIS — G311 Senile degeneration of brain, not elsewhere classified: Secondary | ICD-10-CM | POA: Diagnosis not present

## 2024-02-09 DIAGNOSIS — N1832 Chronic kidney disease, stage 3b: Secondary | ICD-10-CM | POA: Diagnosis not present

## 2024-02-09 DIAGNOSIS — I10 Essential (primary) hypertension: Secondary | ICD-10-CM | POA: Diagnosis not present

## 2024-02-09 DIAGNOSIS — E782 Mixed hyperlipidemia: Secondary | ICD-10-CM | POA: Diagnosis not present

## 2024-02-09 DIAGNOSIS — C541 Malignant neoplasm of endometrium: Secondary | ICD-10-CM | POA: Diagnosis not present

## 2024-02-09 DIAGNOSIS — G301 Alzheimer's disease with late onset: Secondary | ICD-10-CM | POA: Diagnosis not present

## 2024-02-13 DIAGNOSIS — N1832 Chronic kidney disease, stage 3b: Secondary | ICD-10-CM | POA: Diagnosis not present

## 2024-02-13 DIAGNOSIS — I10 Essential (primary) hypertension: Secondary | ICD-10-CM | POA: Diagnosis not present

## 2024-02-13 DIAGNOSIS — G301 Alzheimer's disease with late onset: Secondary | ICD-10-CM | POA: Diagnosis not present

## 2024-02-13 DIAGNOSIS — G311 Senile degeneration of brain, not elsewhere classified: Secondary | ICD-10-CM | POA: Diagnosis not present

## 2024-02-13 DIAGNOSIS — C541 Malignant neoplasm of endometrium: Secondary | ICD-10-CM | POA: Diagnosis not present

## 2024-02-13 DIAGNOSIS — E782 Mixed hyperlipidemia: Secondary | ICD-10-CM | POA: Diagnosis not present

## 2024-02-14 DIAGNOSIS — G301 Alzheimer's disease with late onset: Secondary | ICD-10-CM | POA: Diagnosis not present

## 2024-02-14 DIAGNOSIS — G4700 Insomnia, unspecified: Secondary | ICD-10-CM | POA: Diagnosis not present

## 2024-02-14 DIAGNOSIS — C541 Malignant neoplasm of endometrium: Secondary | ICD-10-CM | POA: Diagnosis not present

## 2024-02-14 DIAGNOSIS — N1832 Chronic kidney disease, stage 3b: Secondary | ICD-10-CM | POA: Diagnosis not present

## 2024-02-14 DIAGNOSIS — G311 Senile degeneration of brain, not elsewhere classified: Secondary | ICD-10-CM | POA: Diagnosis not present

## 2024-02-14 DIAGNOSIS — I10 Essential (primary) hypertension: Secondary | ICD-10-CM | POA: Diagnosis not present

## 2024-02-14 DIAGNOSIS — I509 Heart failure, unspecified: Secondary | ICD-10-CM | POA: Diagnosis not present

## 2024-02-14 DIAGNOSIS — E782 Mixed hyperlipidemia: Secondary | ICD-10-CM | POA: Diagnosis not present

## 2024-02-14 DIAGNOSIS — F32A Depression, unspecified: Secondary | ICD-10-CM | POA: Diagnosis not present

## 2024-02-16 DIAGNOSIS — N1832 Chronic kidney disease, stage 3b: Secondary | ICD-10-CM | POA: Diagnosis not present

## 2024-02-16 DIAGNOSIS — G311 Senile degeneration of brain, not elsewhere classified: Secondary | ICD-10-CM | POA: Diagnosis not present

## 2024-02-16 DIAGNOSIS — E782 Mixed hyperlipidemia: Secondary | ICD-10-CM | POA: Diagnosis not present

## 2024-02-16 DIAGNOSIS — I10 Essential (primary) hypertension: Secondary | ICD-10-CM | POA: Diagnosis not present

## 2024-02-16 DIAGNOSIS — C541 Malignant neoplasm of endometrium: Secondary | ICD-10-CM | POA: Diagnosis not present

## 2024-02-16 DIAGNOSIS — G301 Alzheimer's disease with late onset: Secondary | ICD-10-CM | POA: Diagnosis not present

## 2024-02-20 DIAGNOSIS — C541 Malignant neoplasm of endometrium: Secondary | ICD-10-CM | POA: Diagnosis not present

## 2024-02-20 DIAGNOSIS — N1832 Chronic kidney disease, stage 3b: Secondary | ICD-10-CM | POA: Diagnosis not present

## 2024-02-20 DIAGNOSIS — E782 Mixed hyperlipidemia: Secondary | ICD-10-CM | POA: Diagnosis not present

## 2024-02-20 DIAGNOSIS — G311 Senile degeneration of brain, not elsewhere classified: Secondary | ICD-10-CM | POA: Diagnosis not present

## 2024-02-20 DIAGNOSIS — I10 Essential (primary) hypertension: Secondary | ICD-10-CM | POA: Diagnosis not present

## 2024-02-20 DIAGNOSIS — G301 Alzheimer's disease with late onset: Secondary | ICD-10-CM | POA: Diagnosis not present

## 2024-02-23 DIAGNOSIS — N1832 Chronic kidney disease, stage 3b: Secondary | ICD-10-CM | POA: Diagnosis not present

## 2024-02-23 DIAGNOSIS — I10 Essential (primary) hypertension: Secondary | ICD-10-CM | POA: Diagnosis not present

## 2024-02-23 DIAGNOSIS — G311 Senile degeneration of brain, not elsewhere classified: Secondary | ICD-10-CM | POA: Diagnosis not present

## 2024-02-23 DIAGNOSIS — E782 Mixed hyperlipidemia: Secondary | ICD-10-CM | POA: Diagnosis not present

## 2024-02-23 DIAGNOSIS — C541 Malignant neoplasm of endometrium: Secondary | ICD-10-CM | POA: Diagnosis not present

## 2024-02-23 DIAGNOSIS — G301 Alzheimer's disease with late onset: Secondary | ICD-10-CM | POA: Diagnosis not present

## 2024-02-27 DIAGNOSIS — C541 Malignant neoplasm of endometrium: Secondary | ICD-10-CM | POA: Diagnosis not present

## 2024-02-27 DIAGNOSIS — I10 Essential (primary) hypertension: Secondary | ICD-10-CM | POA: Diagnosis not present

## 2024-02-27 DIAGNOSIS — E782 Mixed hyperlipidemia: Secondary | ICD-10-CM | POA: Diagnosis not present

## 2024-02-27 DIAGNOSIS — G311 Senile degeneration of brain, not elsewhere classified: Secondary | ICD-10-CM | POA: Diagnosis not present

## 2024-02-27 DIAGNOSIS — N1832 Chronic kidney disease, stage 3b: Secondary | ICD-10-CM | POA: Diagnosis not present

## 2024-02-27 DIAGNOSIS — G301 Alzheimer's disease with late onset: Secondary | ICD-10-CM | POA: Diagnosis not present

## 2024-03-01 DIAGNOSIS — N1832 Chronic kidney disease, stage 3b: Secondary | ICD-10-CM | POA: Diagnosis not present

## 2024-03-01 DIAGNOSIS — G301 Alzheimer's disease with late onset: Secondary | ICD-10-CM | POA: Diagnosis not present

## 2024-03-01 DIAGNOSIS — E782 Mixed hyperlipidemia: Secondary | ICD-10-CM | POA: Diagnosis not present

## 2024-03-01 DIAGNOSIS — I10 Essential (primary) hypertension: Secondary | ICD-10-CM | POA: Diagnosis not present

## 2024-03-01 DIAGNOSIS — C541 Malignant neoplasm of endometrium: Secondary | ICD-10-CM | POA: Diagnosis not present

## 2024-03-01 DIAGNOSIS — G311 Senile degeneration of brain, not elsewhere classified: Secondary | ICD-10-CM | POA: Diagnosis not present

## 2024-03-05 DIAGNOSIS — C541 Malignant neoplasm of endometrium: Secondary | ICD-10-CM | POA: Diagnosis not present

## 2024-03-05 DIAGNOSIS — E782 Mixed hyperlipidemia: Secondary | ICD-10-CM | POA: Diagnosis not present

## 2024-03-05 DIAGNOSIS — N1832 Chronic kidney disease, stage 3b: Secondary | ICD-10-CM | POA: Diagnosis not present

## 2024-03-05 DIAGNOSIS — G311 Senile degeneration of brain, not elsewhere classified: Secondary | ICD-10-CM | POA: Diagnosis not present

## 2024-03-05 DIAGNOSIS — I10 Essential (primary) hypertension: Secondary | ICD-10-CM | POA: Diagnosis not present

## 2024-03-05 DIAGNOSIS — G301 Alzheimer's disease with late onset: Secondary | ICD-10-CM | POA: Diagnosis not present

## 2024-03-11 DIAGNOSIS — N1832 Chronic kidney disease, stage 3b: Secondary | ICD-10-CM | POA: Diagnosis not present

## 2024-03-11 DIAGNOSIS — G301 Alzheimer's disease with late onset: Secondary | ICD-10-CM | POA: Diagnosis not present

## 2024-03-11 DIAGNOSIS — G311 Senile degeneration of brain, not elsewhere classified: Secondary | ICD-10-CM | POA: Diagnosis not present

## 2024-03-11 DIAGNOSIS — C541 Malignant neoplasm of endometrium: Secondary | ICD-10-CM | POA: Diagnosis not present

## 2024-03-11 DIAGNOSIS — I10 Essential (primary) hypertension: Secondary | ICD-10-CM | POA: Diagnosis not present

## 2024-03-11 DIAGNOSIS — E782 Mixed hyperlipidemia: Secondary | ICD-10-CM | POA: Diagnosis not present

## 2024-03-12 DIAGNOSIS — E782 Mixed hyperlipidemia: Secondary | ICD-10-CM | POA: Diagnosis not present

## 2024-03-12 DIAGNOSIS — I10 Essential (primary) hypertension: Secondary | ICD-10-CM | POA: Diagnosis not present

## 2024-03-12 DIAGNOSIS — N1832 Chronic kidney disease, stage 3b: Secondary | ICD-10-CM | POA: Diagnosis not present

## 2024-03-12 DIAGNOSIS — C541 Malignant neoplasm of endometrium: Secondary | ICD-10-CM | POA: Diagnosis not present

## 2024-03-12 DIAGNOSIS — G301 Alzheimer's disease with late onset: Secondary | ICD-10-CM | POA: Diagnosis not present

## 2024-03-12 DIAGNOSIS — G311 Senile degeneration of brain, not elsewhere classified: Secondary | ICD-10-CM | POA: Diagnosis not present

## 2024-03-16 DIAGNOSIS — I10 Essential (primary) hypertension: Secondary | ICD-10-CM | POA: Diagnosis not present

## 2024-03-16 DIAGNOSIS — C541 Malignant neoplasm of endometrium: Secondary | ICD-10-CM | POA: Diagnosis not present

## 2024-03-16 DIAGNOSIS — G4700 Insomnia, unspecified: Secondary | ICD-10-CM | POA: Diagnosis not present

## 2024-03-16 DIAGNOSIS — F32A Depression, unspecified: Secondary | ICD-10-CM | POA: Diagnosis not present

## 2024-03-16 DIAGNOSIS — G311 Senile degeneration of brain, not elsewhere classified: Secondary | ICD-10-CM | POA: Diagnosis not present

## 2024-03-16 DIAGNOSIS — N1832 Chronic kidney disease, stage 3b: Secondary | ICD-10-CM | POA: Diagnosis not present

## 2024-03-16 DIAGNOSIS — G301 Alzheimer's disease with late onset: Secondary | ICD-10-CM | POA: Diagnosis not present

## 2024-03-16 DIAGNOSIS — E782 Mixed hyperlipidemia: Secondary | ICD-10-CM | POA: Diagnosis not present

## 2024-03-16 DIAGNOSIS — I509 Heart failure, unspecified: Secondary | ICD-10-CM | POA: Diagnosis not present

## 2024-03-19 DIAGNOSIS — E782 Mixed hyperlipidemia: Secondary | ICD-10-CM | POA: Diagnosis not present

## 2024-03-19 DIAGNOSIS — N1832 Chronic kidney disease, stage 3b: Secondary | ICD-10-CM | POA: Diagnosis not present

## 2024-03-19 DIAGNOSIS — G301 Alzheimer's disease with late onset: Secondary | ICD-10-CM | POA: Diagnosis not present

## 2024-03-19 DIAGNOSIS — G311 Senile degeneration of brain, not elsewhere classified: Secondary | ICD-10-CM | POA: Diagnosis not present

## 2024-03-19 DIAGNOSIS — C541 Malignant neoplasm of endometrium: Secondary | ICD-10-CM | POA: Diagnosis not present

## 2024-03-19 DIAGNOSIS — I10 Essential (primary) hypertension: Secondary | ICD-10-CM | POA: Diagnosis not present

## 2024-03-23 DIAGNOSIS — N1832 Chronic kidney disease, stage 3b: Secondary | ICD-10-CM | POA: Diagnosis not present

## 2024-03-23 DIAGNOSIS — G311 Senile degeneration of brain, not elsewhere classified: Secondary | ICD-10-CM | POA: Diagnosis not present

## 2024-03-23 DIAGNOSIS — E782 Mixed hyperlipidemia: Secondary | ICD-10-CM | POA: Diagnosis not present

## 2024-03-23 DIAGNOSIS — C541 Malignant neoplasm of endometrium: Secondary | ICD-10-CM | POA: Diagnosis not present

## 2024-03-23 DIAGNOSIS — G301 Alzheimer's disease with late onset: Secondary | ICD-10-CM | POA: Diagnosis not present

## 2024-03-23 DIAGNOSIS — I10 Essential (primary) hypertension: Secondary | ICD-10-CM | POA: Diagnosis not present

## 2024-03-24 DIAGNOSIS — E782 Mixed hyperlipidemia: Secondary | ICD-10-CM | POA: Diagnosis not present

## 2024-03-24 DIAGNOSIS — G301 Alzheimer's disease with late onset: Secondary | ICD-10-CM | POA: Diagnosis not present

## 2024-03-24 DIAGNOSIS — I10 Essential (primary) hypertension: Secondary | ICD-10-CM | POA: Diagnosis not present

## 2024-03-24 DIAGNOSIS — C541 Malignant neoplasm of endometrium: Secondary | ICD-10-CM | POA: Diagnosis not present

## 2024-03-24 DIAGNOSIS — N1832 Chronic kidney disease, stage 3b: Secondary | ICD-10-CM | POA: Diagnosis not present

## 2024-03-24 DIAGNOSIS — G311 Senile degeneration of brain, not elsewhere classified: Secondary | ICD-10-CM | POA: Diagnosis not present

## 2024-03-26 DIAGNOSIS — C541 Malignant neoplasm of endometrium: Secondary | ICD-10-CM | POA: Diagnosis not present

## 2024-03-26 DIAGNOSIS — N1832 Chronic kidney disease, stage 3b: Secondary | ICD-10-CM | POA: Diagnosis not present

## 2024-03-26 DIAGNOSIS — G311 Senile degeneration of brain, not elsewhere classified: Secondary | ICD-10-CM | POA: Diagnosis not present

## 2024-03-26 DIAGNOSIS — I10 Essential (primary) hypertension: Secondary | ICD-10-CM | POA: Diagnosis not present

## 2024-03-26 DIAGNOSIS — E782 Mixed hyperlipidemia: Secondary | ICD-10-CM | POA: Diagnosis not present

## 2024-03-26 DIAGNOSIS — G301 Alzheimer's disease with late onset: Secondary | ICD-10-CM | POA: Diagnosis not present

## 2024-04-02 DIAGNOSIS — E782 Mixed hyperlipidemia: Secondary | ICD-10-CM | POA: Diagnosis not present

## 2024-04-02 DIAGNOSIS — G311 Senile degeneration of brain, not elsewhere classified: Secondary | ICD-10-CM | POA: Diagnosis not present

## 2024-04-02 DIAGNOSIS — N1832 Chronic kidney disease, stage 3b: Secondary | ICD-10-CM | POA: Diagnosis not present

## 2024-04-02 DIAGNOSIS — C541 Malignant neoplasm of endometrium: Secondary | ICD-10-CM | POA: Diagnosis not present

## 2024-04-02 DIAGNOSIS — G301 Alzheimer's disease with late onset: Secondary | ICD-10-CM | POA: Diagnosis not present

## 2024-04-02 DIAGNOSIS — I10 Essential (primary) hypertension: Secondary | ICD-10-CM | POA: Diagnosis not present

## 2024-04-05 DIAGNOSIS — I10 Essential (primary) hypertension: Secondary | ICD-10-CM | POA: Diagnosis not present

## 2024-04-05 DIAGNOSIS — N1832 Chronic kidney disease, stage 3b: Secondary | ICD-10-CM | POA: Diagnosis not present

## 2024-04-05 DIAGNOSIS — C541 Malignant neoplasm of endometrium: Secondary | ICD-10-CM | POA: Diagnosis not present

## 2024-04-05 DIAGNOSIS — G311 Senile degeneration of brain, not elsewhere classified: Secondary | ICD-10-CM | POA: Diagnosis not present

## 2024-04-05 DIAGNOSIS — G301 Alzheimer's disease with late onset: Secondary | ICD-10-CM | POA: Diagnosis not present

## 2024-04-05 DIAGNOSIS — E782 Mixed hyperlipidemia: Secondary | ICD-10-CM | POA: Diagnosis not present

## 2024-04-09 DIAGNOSIS — N1832 Chronic kidney disease, stage 3b: Secondary | ICD-10-CM | POA: Diagnosis not present

## 2024-04-09 DIAGNOSIS — C541 Malignant neoplasm of endometrium: Secondary | ICD-10-CM | POA: Diagnosis not present

## 2024-04-09 DIAGNOSIS — I10 Essential (primary) hypertension: Secondary | ICD-10-CM | POA: Diagnosis not present

## 2024-04-09 DIAGNOSIS — G301 Alzheimer's disease with late onset: Secondary | ICD-10-CM | POA: Diagnosis not present

## 2024-04-09 DIAGNOSIS — G311 Senile degeneration of brain, not elsewhere classified: Secondary | ICD-10-CM | POA: Diagnosis not present

## 2024-04-09 DIAGNOSIS — E782 Mixed hyperlipidemia: Secondary | ICD-10-CM | POA: Diagnosis not present

## 2024-04-12 DIAGNOSIS — E782 Mixed hyperlipidemia: Secondary | ICD-10-CM | POA: Diagnosis not present

## 2024-04-12 DIAGNOSIS — N1832 Chronic kidney disease, stage 3b: Secondary | ICD-10-CM | POA: Diagnosis not present

## 2024-04-12 DIAGNOSIS — G311 Senile degeneration of brain, not elsewhere classified: Secondary | ICD-10-CM | POA: Diagnosis not present

## 2024-04-12 DIAGNOSIS — I10 Essential (primary) hypertension: Secondary | ICD-10-CM | POA: Diagnosis not present

## 2024-04-12 DIAGNOSIS — G301 Alzheimer's disease with late onset: Secondary | ICD-10-CM | POA: Diagnosis not present

## 2024-04-12 DIAGNOSIS — C541 Malignant neoplasm of endometrium: Secondary | ICD-10-CM | POA: Diagnosis not present

## 2024-04-15 DIAGNOSIS — G311 Senile degeneration of brain, not elsewhere classified: Secondary | ICD-10-CM | POA: Diagnosis not present

## 2024-04-15 DIAGNOSIS — I509 Heart failure, unspecified: Secondary | ICD-10-CM | POA: Diagnosis not present

## 2024-04-15 DIAGNOSIS — F32A Depression, unspecified: Secondary | ICD-10-CM | POA: Diagnosis not present

## 2024-04-15 DIAGNOSIS — G301 Alzheimer's disease with late onset: Secondary | ICD-10-CM | POA: Diagnosis not present

## 2024-04-15 DIAGNOSIS — N1832 Chronic kidney disease, stage 3b: Secondary | ICD-10-CM | POA: Diagnosis not present

## 2024-04-15 DIAGNOSIS — E782 Mixed hyperlipidemia: Secondary | ICD-10-CM | POA: Diagnosis not present

## 2024-04-15 DIAGNOSIS — G4700 Insomnia, unspecified: Secondary | ICD-10-CM | POA: Diagnosis not present

## 2024-04-15 DIAGNOSIS — I10 Essential (primary) hypertension: Secondary | ICD-10-CM | POA: Diagnosis not present

## 2024-04-15 DIAGNOSIS — C541 Malignant neoplasm of endometrium: Secondary | ICD-10-CM | POA: Diagnosis not present

## 2024-04-16 DIAGNOSIS — G311 Senile degeneration of brain, not elsewhere classified: Secondary | ICD-10-CM | POA: Diagnosis not present

## 2024-04-16 DIAGNOSIS — G301 Alzheimer's disease with late onset: Secondary | ICD-10-CM | POA: Diagnosis not present

## 2024-04-16 DIAGNOSIS — E782 Mixed hyperlipidemia: Secondary | ICD-10-CM | POA: Diagnosis not present

## 2024-04-16 DIAGNOSIS — I10 Essential (primary) hypertension: Secondary | ICD-10-CM | POA: Diagnosis not present

## 2024-04-16 DIAGNOSIS — N1832 Chronic kidney disease, stage 3b: Secondary | ICD-10-CM | POA: Diagnosis not present

## 2024-04-16 DIAGNOSIS — C541 Malignant neoplasm of endometrium: Secondary | ICD-10-CM | POA: Diagnosis not present

## 2024-04-22 DIAGNOSIS — I10 Essential (primary) hypertension: Secondary | ICD-10-CM | POA: Diagnosis not present

## 2024-04-22 DIAGNOSIS — N1832 Chronic kidney disease, stage 3b: Secondary | ICD-10-CM | POA: Diagnosis not present

## 2024-04-22 DIAGNOSIS — G301 Alzheimer's disease with late onset: Secondary | ICD-10-CM | POA: Diagnosis not present

## 2024-04-22 DIAGNOSIS — E782 Mixed hyperlipidemia: Secondary | ICD-10-CM | POA: Diagnosis not present

## 2024-04-22 DIAGNOSIS — G311 Senile degeneration of brain, not elsewhere classified: Secondary | ICD-10-CM | POA: Diagnosis not present

## 2024-04-22 DIAGNOSIS — C541 Malignant neoplasm of endometrium: Secondary | ICD-10-CM | POA: Diagnosis not present

## 2024-04-27 DIAGNOSIS — I10 Essential (primary) hypertension: Secondary | ICD-10-CM | POA: Diagnosis not present

## 2024-04-27 DIAGNOSIS — G311 Senile degeneration of brain, not elsewhere classified: Secondary | ICD-10-CM | POA: Diagnosis not present

## 2024-04-27 DIAGNOSIS — N1832 Chronic kidney disease, stage 3b: Secondary | ICD-10-CM | POA: Diagnosis not present

## 2024-04-27 DIAGNOSIS — E782 Mixed hyperlipidemia: Secondary | ICD-10-CM | POA: Diagnosis not present

## 2024-04-27 DIAGNOSIS — G301 Alzheimer's disease with late onset: Secondary | ICD-10-CM | POA: Diagnosis not present

## 2024-04-27 DIAGNOSIS — C541 Malignant neoplasm of endometrium: Secondary | ICD-10-CM | POA: Diagnosis not present

## 2024-04-29 DIAGNOSIS — E782 Mixed hyperlipidemia: Secondary | ICD-10-CM | POA: Diagnosis not present

## 2024-04-29 DIAGNOSIS — C541 Malignant neoplasm of endometrium: Secondary | ICD-10-CM | POA: Diagnosis not present

## 2024-04-29 DIAGNOSIS — G301 Alzheimer's disease with late onset: Secondary | ICD-10-CM | POA: Diagnosis not present

## 2024-04-29 DIAGNOSIS — G311 Senile degeneration of brain, not elsewhere classified: Secondary | ICD-10-CM | POA: Diagnosis not present

## 2024-04-29 DIAGNOSIS — N1832 Chronic kidney disease, stage 3b: Secondary | ICD-10-CM | POA: Diagnosis not present

## 2024-04-29 DIAGNOSIS — I10 Essential (primary) hypertension: Secondary | ICD-10-CM | POA: Diagnosis not present

## 2024-05-06 DIAGNOSIS — C541 Malignant neoplasm of endometrium: Secondary | ICD-10-CM | POA: Diagnosis not present

## 2024-05-06 DIAGNOSIS — G301 Alzheimer's disease with late onset: Secondary | ICD-10-CM | POA: Diagnosis not present

## 2024-05-06 DIAGNOSIS — E782 Mixed hyperlipidemia: Secondary | ICD-10-CM | POA: Diagnosis not present

## 2024-05-06 DIAGNOSIS — G311 Senile degeneration of brain, not elsewhere classified: Secondary | ICD-10-CM | POA: Diagnosis not present

## 2024-05-06 DIAGNOSIS — N1832 Chronic kidney disease, stage 3b: Secondary | ICD-10-CM | POA: Diagnosis not present

## 2024-05-06 DIAGNOSIS — I10 Essential (primary) hypertension: Secondary | ICD-10-CM | POA: Diagnosis not present

## 2024-05-12 DIAGNOSIS — I10 Essential (primary) hypertension: Secondary | ICD-10-CM | POA: Diagnosis not present

## 2024-05-12 DIAGNOSIS — N1832 Chronic kidney disease, stage 3b: Secondary | ICD-10-CM | POA: Diagnosis not present

## 2024-05-12 DIAGNOSIS — E782 Mixed hyperlipidemia: Secondary | ICD-10-CM | POA: Diagnosis not present

## 2024-05-12 DIAGNOSIS — C541 Malignant neoplasm of endometrium: Secondary | ICD-10-CM | POA: Diagnosis not present

## 2024-05-12 DIAGNOSIS — G301 Alzheimer's disease with late onset: Secondary | ICD-10-CM | POA: Diagnosis not present

## 2024-05-12 DIAGNOSIS — G311 Senile degeneration of brain, not elsewhere classified: Secondary | ICD-10-CM | POA: Diagnosis not present

## 2024-05-13 DIAGNOSIS — G301 Alzheimer's disease with late onset: Secondary | ICD-10-CM | POA: Diagnosis not present

## 2024-05-13 DIAGNOSIS — G311 Senile degeneration of brain, not elsewhere classified: Secondary | ICD-10-CM | POA: Diagnosis not present

## 2024-05-13 DIAGNOSIS — E782 Mixed hyperlipidemia: Secondary | ICD-10-CM | POA: Diagnosis not present

## 2024-05-13 DIAGNOSIS — N1832 Chronic kidney disease, stage 3b: Secondary | ICD-10-CM | POA: Diagnosis not present

## 2024-05-13 DIAGNOSIS — I10 Essential (primary) hypertension: Secondary | ICD-10-CM | POA: Diagnosis not present

## 2024-05-13 DIAGNOSIS — C541 Malignant neoplasm of endometrium: Secondary | ICD-10-CM | POA: Diagnosis not present

## 2024-05-16 DIAGNOSIS — I10 Essential (primary) hypertension: Secondary | ICD-10-CM | POA: Diagnosis not present

## 2024-05-16 DIAGNOSIS — G4700 Insomnia, unspecified: Secondary | ICD-10-CM | POA: Diagnosis not present

## 2024-05-16 DIAGNOSIS — F32A Depression, unspecified: Secondary | ICD-10-CM | POA: Diagnosis not present

## 2024-05-16 DIAGNOSIS — C541 Malignant neoplasm of endometrium: Secondary | ICD-10-CM | POA: Diagnosis not present

## 2024-05-16 DIAGNOSIS — G311 Senile degeneration of brain, not elsewhere classified: Secondary | ICD-10-CM | POA: Diagnosis not present

## 2024-05-16 DIAGNOSIS — G301 Alzheimer's disease with late onset: Secondary | ICD-10-CM | POA: Diagnosis not present

## 2024-05-16 DIAGNOSIS — E782 Mixed hyperlipidemia: Secondary | ICD-10-CM | POA: Diagnosis not present

## 2024-05-16 DIAGNOSIS — N1832 Chronic kidney disease, stage 3b: Secondary | ICD-10-CM | POA: Diagnosis not present

## 2024-05-16 DIAGNOSIS — I509 Heart failure, unspecified: Secondary | ICD-10-CM | POA: Diagnosis not present

## 2024-05-20 DIAGNOSIS — I10 Essential (primary) hypertension: Secondary | ICD-10-CM | POA: Diagnosis not present

## 2024-05-20 DIAGNOSIS — N1832 Chronic kidney disease, stage 3b: Secondary | ICD-10-CM | POA: Diagnosis not present

## 2024-05-20 DIAGNOSIS — E782 Mixed hyperlipidemia: Secondary | ICD-10-CM | POA: Diagnosis not present

## 2024-05-20 DIAGNOSIS — G311 Senile degeneration of brain, not elsewhere classified: Secondary | ICD-10-CM | POA: Diagnosis not present

## 2024-05-20 DIAGNOSIS — G301 Alzheimer's disease with late onset: Secondary | ICD-10-CM | POA: Diagnosis not present

## 2024-05-20 DIAGNOSIS — C541 Malignant neoplasm of endometrium: Secondary | ICD-10-CM | POA: Diagnosis not present

## 2024-05-26 DIAGNOSIS — C541 Malignant neoplasm of endometrium: Secondary | ICD-10-CM | POA: Diagnosis not present

## 2024-05-26 DIAGNOSIS — G301 Alzheimer's disease with late onset: Secondary | ICD-10-CM | POA: Diagnosis not present

## 2024-05-26 DIAGNOSIS — G311 Senile degeneration of brain, not elsewhere classified: Secondary | ICD-10-CM | POA: Diagnosis not present

## 2024-05-26 DIAGNOSIS — E782 Mixed hyperlipidemia: Secondary | ICD-10-CM | POA: Diagnosis not present

## 2024-05-26 DIAGNOSIS — N1832 Chronic kidney disease, stage 3b: Secondary | ICD-10-CM | POA: Diagnosis not present

## 2024-05-26 DIAGNOSIS — I10 Essential (primary) hypertension: Secondary | ICD-10-CM | POA: Diagnosis not present

## 2024-05-27 DIAGNOSIS — E782 Mixed hyperlipidemia: Secondary | ICD-10-CM | POA: Diagnosis not present

## 2024-05-27 DIAGNOSIS — G301 Alzheimer's disease with late onset: Secondary | ICD-10-CM | POA: Diagnosis not present

## 2024-05-27 DIAGNOSIS — G311 Senile degeneration of brain, not elsewhere classified: Secondary | ICD-10-CM | POA: Diagnosis not present

## 2024-05-27 DIAGNOSIS — I10 Essential (primary) hypertension: Secondary | ICD-10-CM | POA: Diagnosis not present

## 2024-05-27 DIAGNOSIS — C541 Malignant neoplasm of endometrium: Secondary | ICD-10-CM | POA: Diagnosis not present

## 2024-05-27 DIAGNOSIS — N1832 Chronic kidney disease, stage 3b: Secondary | ICD-10-CM | POA: Diagnosis not present

## 2024-05-28 DIAGNOSIS — N1832 Chronic kidney disease, stage 3b: Secondary | ICD-10-CM | POA: Diagnosis not present

## 2024-05-28 DIAGNOSIS — G311 Senile degeneration of brain, not elsewhere classified: Secondary | ICD-10-CM | POA: Diagnosis not present

## 2024-05-28 DIAGNOSIS — E782 Mixed hyperlipidemia: Secondary | ICD-10-CM | POA: Diagnosis not present

## 2024-05-28 DIAGNOSIS — G301 Alzheimer's disease with late onset: Secondary | ICD-10-CM | POA: Diagnosis not present

## 2024-05-28 DIAGNOSIS — I10 Essential (primary) hypertension: Secondary | ICD-10-CM | POA: Diagnosis not present

## 2024-05-28 DIAGNOSIS — C541 Malignant neoplasm of endometrium: Secondary | ICD-10-CM | POA: Diagnosis not present

## 2024-06-15 DEATH — deceased
